# Patient Record
Sex: Female | Born: 1935 | Race: White | Hispanic: No | Smoking: Never smoker
Health system: Southern US, Community
[De-identification: ages and names within clinical notes are randomized; demographics above are authoritative.]

## PROBLEM LIST (undated history)

## (undated) DIAGNOSIS — F99 Mental disorder, not otherwise specified: Secondary | ICD-10-CM

## (undated) DIAGNOSIS — F329 Major depressive disorder, single episode, unspecified: Secondary | ICD-10-CM

## (undated) DIAGNOSIS — I739 Peripheral vascular disease, unspecified: Secondary | ICD-10-CM

## (undated) DIAGNOSIS — I10 Essential (primary) hypertension: Secondary | ICD-10-CM

## (undated) DIAGNOSIS — F419 Anxiety disorder, unspecified: Secondary | ICD-10-CM

## (undated) DIAGNOSIS — I499 Cardiac arrhythmia, unspecified: Secondary | ICD-10-CM

## (undated) DIAGNOSIS — M797 Fibromyalgia: Secondary | ICD-10-CM

## (undated) DIAGNOSIS — S2231XA Fracture of one rib, right side, initial encounter for closed fracture: Secondary | ICD-10-CM

## (undated) DIAGNOSIS — F32A Depression, unspecified: Secondary | ICD-10-CM

## (undated) DIAGNOSIS — I82409 Acute embolism and thrombosis of unspecified deep veins of unspecified lower extremity: Secondary | ICD-10-CM

## (undated) DIAGNOSIS — K219 Gastro-esophageal reflux disease without esophagitis: Secondary | ICD-10-CM

## (undated) DIAGNOSIS — I639 Cerebral infarction, unspecified: Secondary | ICD-10-CM

## (undated) DIAGNOSIS — D649 Anemia, unspecified: Secondary | ICD-10-CM

## (undated) DIAGNOSIS — M1990 Unspecified osteoarthritis, unspecified site: Secondary | ICD-10-CM

## (undated) DIAGNOSIS — Z8719 Personal history of other diseases of the digestive system: Secondary | ICD-10-CM

## (undated) DIAGNOSIS — J189 Pneumonia, unspecified organism: Secondary | ICD-10-CM

## (undated) HISTORY — PX: KNEE SURGERY: SHX244

## (undated) HISTORY — PX: EYE SURGERY: SHX253

## (undated) HISTORY — PX: CHOLECYSTECTOMY: SHX55

## (undated) HISTORY — PX: VARICOSE VEIN SURGERY: SHX832

## (undated) HISTORY — PX: ABDOMINAL HYSTERECTOMY: SHX81

## (undated) HISTORY — PX: ENDOVASCULAR STENT INSERTION: SHX5161

---

## 1999-01-05 ENCOUNTER — Encounter: Admission: RE | Admit: 1999-01-05 | Discharge: 1999-04-05 | Payer: Self-pay | Admitting: Orthopedic Surgery

## 1999-10-25 ENCOUNTER — Ambulatory Visit (HOSPITAL_COMMUNITY): Admission: RE | Admit: 1999-10-25 | Discharge: 1999-10-25 | Payer: Self-pay | Admitting: *Deleted

## 1999-10-25 ENCOUNTER — Encounter (INDEPENDENT_AMBULATORY_CARE_PROVIDER_SITE_OTHER): Payer: Self-pay | Admitting: Specialist

## 2000-01-21 ENCOUNTER — Emergency Department (HOSPITAL_COMMUNITY): Admission: EM | Admit: 2000-01-21 | Discharge: 2000-01-21 | Payer: Self-pay | Admitting: Emergency Medicine

## 2000-01-21 ENCOUNTER — Encounter: Payer: Self-pay | Admitting: Emergency Medicine

## 2001-03-17 ENCOUNTER — Ambulatory Visit (HOSPITAL_COMMUNITY): Admission: RE | Admit: 2001-03-17 | Discharge: 2001-03-17 | Payer: Self-pay | Admitting: Internal Medicine

## 2001-06-21 ENCOUNTER — Observation Stay (HOSPITAL_COMMUNITY): Admission: EM | Admit: 2001-06-21 | Discharge: 2001-06-24 | Payer: Self-pay | Admitting: *Deleted

## 2001-12-04 ENCOUNTER — Encounter: Payer: Self-pay | Admitting: General Surgery

## 2001-12-09 ENCOUNTER — Observation Stay (HOSPITAL_COMMUNITY): Admission: RE | Admit: 2001-12-09 | Discharge: 2001-12-10 | Payer: Self-pay | Admitting: General Surgery

## 2002-05-10 ENCOUNTER — Encounter: Payer: Self-pay | Admitting: Emergency Medicine

## 2002-05-10 ENCOUNTER — Emergency Department (HOSPITAL_COMMUNITY): Admission: EM | Admit: 2002-05-10 | Discharge: 2002-05-10 | Payer: Self-pay | Admitting: Emergency Medicine

## 2002-11-20 ENCOUNTER — Ambulatory Visit (HOSPITAL_COMMUNITY): Admission: RE | Admit: 2002-11-20 | Discharge: 2002-11-20 | Payer: Self-pay | Admitting: *Deleted

## 2003-06-10 ENCOUNTER — Ambulatory Visit (HOSPITAL_COMMUNITY): Admission: RE | Admit: 2003-06-10 | Discharge: 2003-06-10 | Payer: Self-pay | Admitting: Cardiology

## 2003-07-25 ENCOUNTER — Emergency Department (HOSPITAL_COMMUNITY): Admission: EM | Admit: 2003-07-25 | Discharge: 2003-07-25 | Payer: Self-pay | Admitting: Emergency Medicine

## 2005-03-28 DIAGNOSIS — I639 Cerebral infarction, unspecified: Secondary | ICD-10-CM

## 2005-03-28 HISTORY — DX: Cerebral infarction, unspecified: I63.9

## 2005-04-07 ENCOUNTER — Emergency Department (HOSPITAL_COMMUNITY): Admission: EM | Admit: 2005-04-07 | Discharge: 2005-04-07 | Payer: Self-pay | Admitting: Emergency Medicine

## 2005-04-20 ENCOUNTER — Emergency Department (HOSPITAL_COMMUNITY): Admission: EM | Admit: 2005-04-20 | Discharge: 2005-04-20 | Payer: Self-pay | Admitting: Emergency Medicine

## 2005-04-23 ENCOUNTER — Inpatient Hospital Stay (HOSPITAL_COMMUNITY): Admission: EM | Admit: 2005-04-23 | Discharge: 2005-04-26 | Payer: Self-pay | Admitting: Emergency Medicine

## 2005-04-24 ENCOUNTER — Encounter (INDEPENDENT_AMBULATORY_CARE_PROVIDER_SITE_OTHER): Payer: Self-pay | Admitting: Cardiology

## 2005-05-04 ENCOUNTER — Encounter: Admission: RE | Admit: 2005-05-04 | Discharge: 2005-08-02 | Payer: Self-pay | Admitting: Neurology

## 2005-06-26 ENCOUNTER — Encounter: Payer: Self-pay | Admitting: Neurology

## 2005-08-27 ENCOUNTER — Encounter: Payer: Self-pay | Admitting: Interventional Radiology

## 2005-09-04 ENCOUNTER — Ambulatory Visit (HOSPITAL_COMMUNITY): Admission: RE | Admit: 2005-09-04 | Discharge: 2005-09-05 | Payer: Self-pay | Admitting: Interventional Radiology

## 2005-11-30 ENCOUNTER — Ambulatory Visit (HOSPITAL_COMMUNITY): Admission: RE | Admit: 2005-11-30 | Discharge: 2005-11-30 | Payer: Self-pay | Admitting: Interventional Radiology

## 2006-09-20 ENCOUNTER — Ambulatory Visit (HOSPITAL_COMMUNITY): Admission: RE | Admit: 2006-09-20 | Discharge: 2006-09-20 | Payer: Self-pay | Admitting: Interventional Radiology

## 2006-09-30 ENCOUNTER — Ambulatory Visit (HOSPITAL_COMMUNITY): Admission: RE | Admit: 2006-09-30 | Discharge: 2006-10-01 | Payer: Self-pay | Admitting: Interventional Radiology

## 2006-10-15 ENCOUNTER — Ambulatory Visit (HOSPITAL_COMMUNITY): Admission: RE | Admit: 2006-10-15 | Discharge: 2006-10-15 | Payer: Self-pay | Admitting: Interventional Radiology

## 2007-01-17 ENCOUNTER — Ambulatory Visit (HOSPITAL_COMMUNITY): Admission: RE | Admit: 2007-01-17 | Discharge: 2007-01-17 | Payer: Self-pay | Admitting: Interventional Radiology

## 2007-02-10 ENCOUNTER — Inpatient Hospital Stay (HOSPITAL_COMMUNITY): Admission: RE | Admit: 2007-02-10 | Discharge: 2007-02-11 | Payer: Self-pay | Admitting: Interventional Radiology

## 2007-03-03 ENCOUNTER — Encounter: Payer: Self-pay | Admitting: Interventional Radiology

## 2007-06-20 ENCOUNTER — Ambulatory Visit (HOSPITAL_COMMUNITY): Admission: RE | Admit: 2007-06-20 | Discharge: 2007-06-20 | Payer: Self-pay | Admitting: Interventional Radiology

## 2007-12-08 ENCOUNTER — Ambulatory Visit (HOSPITAL_COMMUNITY): Admission: RE | Admit: 2007-12-08 | Discharge: 2007-12-08 | Payer: Self-pay | Admitting: Interventional Radiology

## 2008-01-24 ENCOUNTER — Emergency Department (HOSPITAL_BASED_OUTPATIENT_CLINIC_OR_DEPARTMENT_OTHER): Admission: EM | Admit: 2008-01-24 | Discharge: 2008-01-24 | Payer: Self-pay | Admitting: Emergency Medicine

## 2008-06-15 ENCOUNTER — Ambulatory Visit (HOSPITAL_COMMUNITY): Admission: RE | Admit: 2008-06-15 | Discharge: 2008-06-15 | Payer: Self-pay | Admitting: Interventional Radiology

## 2009-05-16 ENCOUNTER — Encounter: Admission: RE | Admit: 2009-05-16 | Discharge: 2009-05-16 | Payer: Self-pay | Admitting: Gastroenterology

## 2009-10-22 ENCOUNTER — Ambulatory Visit: Payer: Self-pay | Admitting: Diagnostic Radiology

## 2009-10-22 ENCOUNTER — Emergency Department (HOSPITAL_BASED_OUTPATIENT_CLINIC_OR_DEPARTMENT_OTHER): Admission: EM | Admit: 2009-10-22 | Discharge: 2009-10-22 | Payer: Self-pay | Admitting: Emergency Medicine

## 2009-12-29 ENCOUNTER — Ambulatory Visit (HOSPITAL_COMMUNITY): Admission: RE | Admit: 2009-12-29 | Discharge: 2009-12-29 | Payer: Self-pay | Admitting: Interventional Radiology

## 2010-06-17 ENCOUNTER — Encounter: Payer: Self-pay | Admitting: Cardiology

## 2010-08-14 LAB — CBC
HCT: 31.2 % — ABNORMAL LOW (ref 36.0–46.0)
Platelets: 419 10*3/uL — ABNORMAL HIGH (ref 150–400)
RDW: 14 % (ref 11.5–15.5)
WBC: 8.3 10*3/uL (ref 4.0–10.5)

## 2010-08-14 LAB — BASIC METABOLIC PANEL
BUN: 14 mg/dL (ref 6–23)
Calcium: 9.6 mg/dL (ref 8.4–10.5)
GFR calc non Af Amer: >60 mL/min (ref >60)
Glucose, Bld: 91 mg/dL (ref 70–99)
Potassium: 3.8 mEq/L (ref 3.5–5.1)

## 2010-08-14 LAB — URINALYSIS, ROUTINE W REFLEX MICROSCOPIC
Bilirubin Urine: NEGATIVE
Glucose, UA: NEGATIVE mg/dL
Hgb urine dipstick: NEGATIVE
Ketones, ur: NEGATIVE mg/dL
Nitrite: NEGATIVE
Specific Gravity, Urine: 1.02 (ref 1.005–1.030)
pH: 6.5 (ref 5.0–8.0)

## 2010-08-14 LAB — DIFFERENTIAL
Basophils Absolute: 0 10*3/uL (ref 0.0–0.1)
Lymphocytes Relative: 20 % (ref 12–46)
Lymphs Abs: 1.6 10*3/uL (ref 0.7–4.0)
Neutro Abs: 5.9 10*3/uL (ref 1.7–7.7)
Neutrophils Relative %: 70 % (ref 43–77)

## 2010-08-14 LAB — URINE CULTURE: Colony Count: 9000

## 2010-08-14 LAB — URINE MICROSCOPIC-ADD ON

## 2010-08-14 LAB — CULTURE, BLOOD (ROUTINE X 2): Culture: NO GROWTH

## 2010-09-11 LAB — CREATININE, SERUM
Creatinine, Ser: 0.85 mg/dL (ref 0.4–1.2)
GFR calc Af Amer: >60 mL/min (ref >60)

## 2010-09-11 LAB — BUN: BUN: 14 mg/dL (ref 6–23)

## 2010-10-10 NOTE — H&P (Signed)
Natalie Kent, Natalie Kent               ACCOUNT NO.:  [id number]   MEDICAL RECORD NO.:  [id number]           PATIENT TYPE:   LOCATION:                                 FACILITY:   PHYSICIAN:  Sanjeev K. Deveshwar, M.D.DATE OF BIRTH:  1931/03/22   DATE OF ADMISSION:  02/10/2007  DATE OF DISCHARGE:                              HISTORY & PHYSICAL   CHIEF COMPLAINT:  Cerebrovascular disease.   HISTORY OF PRESENT ILLNESS:  This is a pleasant 77 year old female who  was initially referred to Dr. Corliss Skains through the courtesy of Dr.  Pearlean Brownie.  The patient has a history of multiple CVAs and underwent PTA  stenting of the left middle cerebral artery, performed by Dr. Corliss Skains  on September 04, 2005.  She recently had a follow up angiogram performed on  January 17, 2007 that showed a restenosis with an extended portion of the  left middle cerebral artery, estimated to be 80%.  Arrangements were  made to have the patient returned to [facility] today for a  repeat angiogram and possible intervention for the in-stent stenosis.   PAST MEDICAL HISTORY:  Is significant for multiple left CVAs.  She has a  history of hypertension, gastroesophageal reflux disease, palpitations,  depression, asthma.  She has had a left lower extremity DVT.  She had a  negative dobutamine Cardiolite in March of 2007.  The patient had a  wingspan stent placed in the left middle cerebral artery on September 04, 2005 with placement of a second stent within the first stent on Sep 30, 2006.  She now has a further restenosis.   SURGICAL HISTORY:  Is significant for a cholecystectomy, hysterectomy,  hernia repair, bilateral knee surgery.  The patient denies any previous  problems with anesthesia.   ALLERGIES:  Include KEFLEX, SEPTRA and SOLARCAINE.  She denies any  allergies to contrast dye, shrimp, iodine, shellfish or latex.   MEDICATIONS:  Include Effexor XR 150 mg daily, Prevacid 30 mg daily,  Benicar/HCT 40/25 one-half  tablet daily, Plavix 75 mg daily, Singulair  10 mg daily, hydrocodone p.r.n., Advair 250 mcg b.i.d., albuterol  p.r.n., Taztia 240 mg daily, aspirin 81 mg daily, vitamin D daily.   SOCIAL HISTORY:  The patient is widowed, she has 5 children.  She lives  in [place] with her mother.  She has never used alcohol or tobacco.  She is a retired Administrator, sports.   FAMILY HISTORY:  Her mother is alive and well at age 56.  Her father  died at age 16 from cancer.  She has a sister who died from breast  cancer.   LABORATORY DATA:  INR is 1.0, her PT was 13.1, PTT was 65.  Hemoglobin  10.7, hematocrit 32.5, WBC 7.8, platelets 354,000, BUN 14, creatinine  0.77.  Potassium was 3.5, glucose 121, her GFR was greater than 60.  The  patient has a history of anemia with previous hemoglobin in May of 2008  being 9.9, hematocrit 30.1.   REVIEW OF SYSTEMS:  The patient has had some occasional shortness of  breath.  She does  have a history of asthma.  She has had a recent cough  which has been nonproductive.  She has occasional dizziness, she has  constipation which is chronic.  She has arthritis and fibromyalgia.  She  reports bruising easily.  The remainder of the review of systems is  negative.   PHYSICAL EXAM:  Reveals a pleasant 77 year old white female, somewhat  anxious but in no acute distress.  VITAL SIGNS:  Blood pressure 126/72, pulse 74, respirations 18,  temperature 97.4.  HEENT:  Unremarkable.  NECK:  Revealed no bruits.  HEART:  Revealed regular rate and rhythm with a grade 2-3/6 systolic  murmur.  LUNGS:  Were clear but decreased.  ABDOMEN:  Soft, nontender.  EXTREMITIES:  Revealed pulses to be weak but intact.  There was no  edema.  Her airway was rated at a 1, her ASA scale was a 3.  NEUROLOGICAL EXAM:  Mental status, the patient was alert and oriented,  follows commands, cranial nerves II-XII were grossly intact.  Sensation  was intact to light touch.  Motor strength was 5/5  throughout.  Cerebellar testing was intact.   IMPRESSION:  1. Cerebrovascular disease with previous left cerebrovascular      accidents.  2. History of a left middle cerebral artery stenosis with placement of      a wingspan stent in April of 2007, followed by a second stent      placed in May of 2008, now with a recurrent stenosis of      approximately 80%.  3. History of hypertension.  4. Gastroesophageal reflux disease.  5. History of palpitations.  6. History of depression.  7. History of asthma.  8. Remote left lower extremity deep venous thrombosis.  9. History of a negative dobutamine Cardiolite in March 2007.  10.Status post multiple surgeries.  11.Anemia.  12.History of a chronically elevated PTT of uncertain etiology.   PLAN:  As noted the patient was admitted to [facility] today to  undergo a cerebral angiogram and possible treatment of a recurrent  stenosis of a previously placed left middle cerebral artery stent.  The  patient is on chronic aspirin and Plavix therapy.  She also received  nimodipine and vancomycin prior to her intervention.  Further  recommendations will be made by Dr. Corliss Skains after performing the  cerebral angiogram this morning.      Delton See, P.A.    ______________________________  Grandville Silos. Corliss Skains, M.D.    DR/MEDQ  D:  02/10/2007  T:  02/10/2007  Job:  [id number]   cc:   Pramod P. Pearlean Brownie, MD  Soyla Murphy Renne Crigler, M.D.  Sanjeev K. Corliss Skains, M.D.

## 2010-10-10 NOTE — Discharge Summary (Signed)
Natalie Kent, Natalie Kent               ACCOUNT NO.:  [id number]   MEDICAL RECORD NO.:  [id number]          PATIENT TYPE:  INP   LOCATION:  3113                         FACILITY:  [facility]   PHYSICIAN:  Sanjeev K. Deveshwar, M.D.DATE OF BIRTH:  [birth year]   DATE OF ADMISSION:  02/10/2007  DATE OF DISCHARGE:  02/11/2007                               DISCHARGE SUMMARY   CHIEF COMPLAINT:  Cerebrovascular disease.   HISTORY OF PRESENT ILLNESS:  This is a pleasant 77 year old female who  was referred to Dr. Corliss Skains through the courtesy of Dr. Pearlean Brownie.  The  patient has a history of multiple CVAs and underwent PTA stenting of the  left middle cerebral artery by  Dr. Corliss Skains on September 04, 2005.  She had a second stent placed in the  left middle cerebral artery in May 2008.  The patient had a recent  cerebral angiogram that revealed an 80% in-stent stenosis.  Arrangements  were made to readmit the patient for further evaluation and treatment on  February 10, 2007.   PAST MEDICAL HISTORY:  Significant for multiple left CVA's.  She has a  history of hypertension, gastroesophageal reflux disease, palpitations,  depression, and asthma.  She has a history of a remote left lower  extremity DVT.  She had a negative dobutamine Cardiolite in July of this  year performed at [facility], although her  cardiologist of record is Dr. Aleen Campi.  The Cardiolite apparently was  negative with an ejection fraction of 79%.   SURGICAL HISTORY:  The patient is status post cholecystectomy,  hysterectomy, hernia repair, and bilateral knee surgery.  The patient  denies any previous problems with anesthesia.   ALLERGIES:  She is allergic to [facility], SEPTRA, and SOLARCAINE.  She  denies allergies to contrast dye, shrimp, iodine, shellfish or latex.   MEDICATIONS AT TIME OF ADMISSION:  1. Effexor XR 150 mg daily.  2. Prevacid 30 mg daily.  3. Benicar/HCT 40/25 1/2 tablet daily.  4. Plavix  75 mg daily.  5. Singulair 10 mg daily.  6. Hydrocodone p.r.n.  7. Advair 250 mcg b.i.d.  8. Albuterol p.r.n.  9. Taztia 240 mg daily.  10.Aspirin 81 mg daily.  11.Vitamin D daily.   SOCIAL HISTORY:  The patient is widowed.  She has five children.  She  lives in [place] with her mother.  She has never used alcohol or  tobacco.  She is a retired day Occupational hygienist.   FAMILY HISTORY:  Her mother is alive and well at age 105.  Her father  died at age 28 from cancer.  She has a sister who died from breast  cancer.   HOSPITAL COURSE:  As noted this patient was admitted to [facility] on February 10, 2007 for further evaluation of a previously  stented left middle cerebral artery stenosis.  A recent angiogram had  revealed an 80% in-stent stenosis.  The angiogram was performed on the  day of admission by Dr. Corliss Skains under conscious sedation.  Again she  was found to have an 80-90% stenosis within the stent in  the left middle  cerebral artery.  A PTA of the stent was performed by Dr. Corliss Skains  under general anesthesia with excellent results.  There were no known or  immediate complications.   The patient did develop some respiratory distress when coming out of  anesthesia.  She does have a history of asthma.  She developed some  wheezing and received a nebulizer treatment.  She was later admitted to  the neurointensive care unit.   While being transported to the neurointensive care unit the patient  developed hypotension and bradycardia.  Her systolic blood pressure was  in the 80s to 90s.  Her heart rate was in the 30-40 range.  The patient  was given a bolus of normal saline.  It was felt that this was possibly  a vasovagal reaction.  Gradually her blood pressure improved.  She also  had some epigastric burning associated with this episode.  She was given  Maalox which did relieve the burning.  Her heart rate remained slow in  the 40-50 beats per minute range.   A  cardiology consult was obtained for further evaluation.  She was seen  by Dr. Nanetta Batty.  An EKG showed sinus bradycardia with a question  of a prolonged QT interval but no ischemic changes.  As noted she had  recently had a negative dobutamine Cardiolite.  Cardiac enzymes were  performed.  These were negative for an MI.   The patient was much improved the following day, although she was noted  to be anemic with hemoglobin 8.6.  Hematocrit was 25.7.  On the day of  admission hemoglobin had been 9.1, hematocrit 27.5.  A decision was made  to transfuse the patient 1 unit of packed red blood cells.  A repeat CBC  is currently pending.   The patient is now on bedrest following removal of the right femoral  groin sheath.  She is stable at this time from a neurological and  cardiology standpoint.  If she remained stable after her bedrest she  will be discharged early this evening in improved and stable condition.   LABORATORY DATA:  A PTT on the day of discharge was elevated at 86.  The  patient has a chronically elevated PTT of uncertain etiology. As noted  cardiac enzymes were negative x2.  A basic metabolic panel revealed BUN  8, creatinine 0.71.  GFR was greater than 60.  Potassium 3.6, glucose  100.  CBC on the morning of discharge revealed hemoglobin 8.6,  hematocrit 25.7, WBC 7,500, platelets 305,000.  CBC on the 15th revealed  hemoglobin 9.1, hematocrit 27.5, WBC 9,700,  platelets 296,000.  As  noted the patient will be transfused 1 unit of packed red blood cells.   DISCHARGE INSTRUCTIONS:  The patient will remain on the medications she  was on prior to admission which did include aspirin and Plavix.  Please  see complete list as dictated above.   The patient was given instructions regarding wound care.  She was told  not to do anything strenuous for at least two weeks.  The patient will  follow up with Dr. Corliss Skains in approximately two weeks.  She will need  to have a  follow-up CBC checked at some point as well to further  evaluate her anemia.  A follow-up angiogram will be performed in  approximately three months to further evaluate the status of her left  middle cerebral artery stent.   PROBLEMS AT DISCHARGE:  1. Cerebrovascular disease with  previous cerebrovascular accidents.  2. History of a left middle cerebral artery stenosis with a wingspan      stent placed in April 2007 followed by a second stent in May 2008      and a recent restenosis.  3. PTA of the left middle cerebral artery stent performed February 10, 2007 with excellent results.  4. History of hypertension.  5. Gastroesophageal reflux disease.  6. History of palpitations.  7. History of depression.  8. History of asthma.  9. Remote left lower extremity deep venous thrombosis.  10.History of a negative dobutamine Cardiolite in July of this year.  11.Status post multiple surgeries as noted above.  12.Anemia requiring transfusion.  13.Chronically elevated PTT of uncertain etiology.  14.Probable vasovagal episode this admission associated with      hypotension and bradycardia.  15.Transient hypokalemia.      Delton See, P.A.    ______________________________  Grandville Silos. Corliss Skains, M.D.    DR/MEDQ  D:  02/11/2007  T:  02/11/2007  Job:  [id number]   cc:   Pramod P. Pearlean Brownie, MD  Soyla Murphy Renne Crigler, M.D.  Antionette Char, MD

## 2010-10-10 NOTE — Consult Note (Signed)
NAMEJAIYLA, GRANADOS               ACCOUNT NO.:  [id number]   MEDICAL RECORD NO.:  [id number]          PATIENT TYPE:  OUT   LOCATION:  XRAY                         FACILITY:  [facility]   PHYSICIAN:  Sanjeev K. Deveshwar, M.D.DATE OF BIRTH:  Feb 13, 1931   DATE OF CONSULTATION:  DATE OF DISCHARGE:                                 CONSULTATION   CHIEF COMPLAINT:  Cerebrovascular disease status post left middle  cerebral artery PTA stenting on Sep 30, 2006, for intrastent stenosis.   HISTORY OF PRESENT ILLNESS:  Ms. Knisley is a pleasant 77 year old  female referred to Dr. Corliss Skains through the courtesy of Dr. Pearlean Brownie.  The  patient has a history of multiple left CVAs felt secondary to a left  middle cerebral artery stenosis.  She has had right-sided deficits,  although these significantly improved following her strokes.   On September 04, 2005, the patient underwent PTA stenting of the left middle  cerebral artery performed by Dr. Corliss Skains under general anesthesia  using the Wingspan stenting system.  The patient has been followed  closely since that time.  She recently developed new symptoms in her  right upper extremity including numbness and weakness.  A repeat  angiogram was performed and she was found to have in-stent stenosis  which was quite severe.  On Sep 30, 2006, patient underwent placement of  a second stent within the previously placed first wing span stent in her  left middle cerebral artery.  There were no immediate or known  complications.  The patient returns today to be seen in follow-up.   PAST MEDICAL HISTORY:  As noted, the patient has had multiple left CVAs.  She has history of hypertension, gastroesophageal reflux disease,  palpitations, depression, asthma, a previous left lower extremity DVT  and a history of a negative dobutamine Cardiolite performed August 24, 2005.  She underwent placement of a Wingspan stent on September 04, 2005,  with placement of a second stent within  the first stent on Sep 30, 2006.   SURGICAL HISTORY:  1. Cholecystectomy.  2. Hysterectomy.  3. Hernia repair.  4. Bilateral knee surgery.  5. Denies any previous problems with anesthesia.   ALLERGIES:  KEFLEX, SEPTRA AND SOLARCAINE.   MEDICATIONS:  1. Effexor.  2. Singulair.  3. Aspirin.  4. Prevacid.  5. Benicar.  6. Vitamin D.  7. Plavix.  8. Advair.  9. Albuterol.  10.Diltiazem.   SOCIAL HISTORY:  The patient is widowed.  She has five children.  She  lives in [place] with her mother.  She has never used tobacco or  alcohol.  She is a retired Engineering geologist.   FAMILY HISTORY:  Her mother is alive and well at age 66.  Her father  died at age 60 from cancer.  She has a sister who died from breast  cancer.   IMPRESSION/PLAN:  As noted, the patient returns today to be seen in  follow-up after undergoing placement of a second stent in the left  middle cerebral artery for in-stent stenosis of the previously placed  stent.  The patient reports that she  has been doing well overall, though  she feels somewhat fatigued.  She feels she is having trouble getting  her strength back following the procedure.  She was noted to be anemic  during her hospital stay.  She was asked to follow-up with her family  physician, Dr. Merri Brunette, to have a CBC blood test as well as a  potassium level checked.  However, apparently, these labs could not be  performed at the office.  We will check a CBC and serum potassium level  today.   The patient has some residual numbness in the two fingers of her right  hand which has been a longstanding problem.  She has had occasional  sharp shooting pains in the left side of her head, but otherwise, has  been stable from a neurological standpoint.  She remains on aspirin and  Plavix which she has been taking long-term.  There are no plans to stop  the Plavix at any point in the near future.   Dr. Corliss Skains recommended a follow-up angiogram in  approximately 3  months to evaluate the patency of the stents.  She was told to call  sooner if she had any new neurological deficits.  We will call her with  the results of her blood work later this week.  Greater than 15 minutes  was spent on this consult.      Delton See, P.A.    ______________________________  Grandville Silos. Corliss Skains, M.D.    DR/MEDQ  D:  10/15/2006  T:  10/15/2006  Job:  [id number]   cc:   Soyla Murphy. Renne Crigler, M.D.  Pramod P. Pearlean Brownie, MD

## 2010-10-13 NOTE — H&P (Signed)
[facility]  Patient:    Natalie Kent, Natalie Kent Visit Number: [id number] MRN: [id number]          Service Type: MED Location: [phone] Attending Physician:  Natalie Kent Dictated by:   Natalie Kent. Natalie Kent, M.D. Admit Date:  06/20/2001   CC:         Natalie Kent, M.D.   History and Physical  DATE OF BIRTH:  2030/10/16  HISTORY OF PRESENT ILLNESS:  Natalie Kent is a 77 year old resident of [place], [place], admitted with a chief complaint of abdominal pain.  She states that she felt absolutely fine until 10 a.m. on June 20, 2001, when she had sudden abdominal discomfort.  It started in the lower abdomen and went upwards and involved the whole abdomen.  She had cramps and cool sweaty skin, but no nausea or vomiting.  At the time it occurred, she felt awful "like I was dying."  She had diarrhea, but did not look at the diarrhea itself.  She did notice that there was blood on the toilet paper.  Then she took an Imodium tablet and had no further bowel movements.  The pain was intermittent and less than afternoon, but worsened this evening again and she came to the emergency room for evaluation.  She has had no nausea or vomiting and no fevers, but some chills.  She has had recent iron deficiency anemia and is due to see Natalie Kent, M.D., soon for a follow-up visit.  PAST MEDICAL HISTORY:  Notable for asthma and hypertension.  ALLERGIES:  She is allergic to SULFA (rash), KEFLEX (rash), and the CAINES.  CURRENT MEDICATIONS:  1. Prevacid 30 mg p.o. q.a.m.  2. Effexor XR 75 mg p.o. b.i.d.  3. Hyzaar 100 mg q.a.m.  4. Singulair 10 mg q.a.m.  5. Advair 250/50 one inhalation twice a day.  6. Albuterol one to two puffs q.i.d. p.r.n.  7. Iron sulfate one p.o. each morning.  8. Calcium 1500 mg daily.  9. Darvocet one q.i.d. p.r.n. pain. 10. Multivitamins one daily.  PERSONAL HISTORY:  [place]  She is  retired and stays with her mother.  She does not get any regular exercise.  She does not use any ethanol or tobacco.  FAMILY HISTORY:  Her father had colon cancer.  Her mother is alive and well. A brother had prostate cancer.  A sister has had breast cancer.  Five children alive and well.  REVIEW OF SYSTEMS:  She has had no numbness, tingling, weakness, seizures, syncope, or dizziness.  She had a slight headache earlier today.  The skin is dry with no rashes.  There is no sinus pain, ear pain, runny nose, sore throat, or difficulty swallowing.  Occasional dry cough, infrequent, but no chest pain, shortness of breath, or change in her exercise tolerance.  No palpitations, vaginal bleeding, or discharge.  Although she has had no dysuria or hematuria, she has been going to the bathroom no more frequently than usual.  Chronic low back pain and leg pain is really unchanged.  No swelling. Depression is under good control with medications.  PHYSICAL EXAMINATION:  In general, she is slightly overweight and in no acute distress.  Color is good.  VITAL SIGNS:  Temperature 98.6 degrees, BP initially 183/85 with pulse 109, respirations 18, O2 saturation 93% on room air.  SKIN:  Moisture and slight erythema under her left breast fold.  LYMPHATICS:  There is no cervical, supraclavicular, axillary,  or inguinal adenopathy.  HEENT:  Normocephalic and atraumatic.  TMs are normal.  Conjunctivae are normal.  The fundi appear normal.  Her tongue and posterior pharynx are normal.  She does appear dry at this time.  NECK:  Supple.  There are no neck masses and no thyromegaly.  No carotid bruits.  PERIPHERAL PULSES:  Carotids, radials, and posterior tibials 2+ and equal. Dorsalis pedis pulses 1+.  BREASTS:  Pendulous with no masses.  LUNGS:  Clear to auscultation.  HEART:  Regular rate and rhythm.  No murmurs, rubs, or gallops.  No JVD.  No edema.  ABDOMEN:  Nontender at this time.  Bowel  sounds present.  No organomegaly.  o masses.  Bimanual exam shows no masses or tenderness.  RECTAL:  Exam previously done by the emergency room physician and is not repeated at this time per her request.  It showed frankly bloody stool.  IMPRESSION:  1. Gastrointestinal bleed, question diverticulitis.  Her white count is only     slightly elevated with no left shift at this time.  Some types of     infectious diarrheas are actually worsened by antibiotic use.  I am     holding off on antibiotics at this time and closely observing her in the     hospital on IV fluids and a fortified clear liquid diet.  I do not think     that her GI bleeding is heavy enough at this time to warrant transfusions.     In fact, her hemoglobin is quite good.  2. Mild impaired glucose tolerance.  Will check CBGs for two days.  3. Urinary frequency with mild pyuria.  Will check a urine culture.  I doubt     that she has an infection there at this time.  4. Multiple allergies, see above.  5. Depression.  6. Recent iron deficiency anemia.  The patient admits to work-up by Natalie Kent, M.D.  7. Asthma.  8. History of gastritis.  9. Family history of colon and breast cancer. 10. History of diverticulosis. Dictated by:   Natalie Kent. Natalie Kent, M.D. Attending Physician:  Natalie Kent DD:  06/21/01 TD:  06/21/01 Job: [id number]

## 2010-10-13 NOTE — Op Note (Signed)
   Natalie Kent, Natalie Kent                         ACCOUNT NO.:  [id number]   MEDICAL RECORD NO.:  [id number]                   PATIENT TYPE:  AMB   LOCATION:  ENDO                                 FACILITY:  [facility]   PHYSICIAN:  Georgiana Spinner, M.D.                 DATE OF BIRTH:  06-08-1930   DATE OF PROCEDURE:  11/20/2002  DATE OF DISCHARGE:                                 OPERATIVE REPORT   PROCEDURE:  Colonoscopy.   INDICATIONS:  Colon polyps, colon cancer screening.   ANESTHESIA:  1. Demerol 130 mg.  2. Versed 14 mg.   DESCRIPTION OF PROCEDURE:  With patient mildly sedated in the left lateral  decubitus position, the Olympus videoscopic colonoscope PCF-100 was inserted  in the rectum and passed under direct vision through a very tortuous,  diverticula-filled, sigmoid colon to the cecum, identified by the ileocecal  valve and appendiceal orifice, both of which were photographed.  From this  point, the colonoscope was slowly withdrawn, taking circumferential views of  the entire colonic mucosa, stopping only to photograph diverticula along the  way in the sigmoid colon until we reached the rectum which appeared normal  on direct and showed hemorrhoids on retroflexed view.  The endoscope was  straightened and withdrawn.  The patient's vital signs and pulse oximeter  remained stable.  The patient tolerated the procedure well without apparent  complications.   FINDINGS:  Internal hemorrhoids and significant diverticulosis of the  sigmoid colon.   PLAN:  Repeat examination in possibly five years.                                               Georgiana Spinner, M.D.    GMO/MEDQ  D:  11/20/2002  T:  11/20/2002  Job:  [id number]

## 2010-10-13 NOTE — Consult Note (Signed)
Natalie Kent, Natalie Kent               ACCOUNT NO.:  [id number]   MEDICAL RECORD NO.:  [id number]          PATIENT TYPE:  OUT   LOCATION:  XRAY                         FACILITY:  [facility]   PHYSICIAN:  Delton See, P.A.   DATE OF BIRTH:  Feb 10, 1931   DATE OF CONSULTATION:  DATE OF DISCHARGE:  10/15/2006                                 CONSULTATION   BRIEF ADDENDUM:  Date of the addendum 10/17/2006.  Natalie Kent was seen  back in the office on 10/15/2006 following her PTA stenting of the left  middle cerebral artery performed Sep 30, 2006.  During her hospital stay,  she had been anemic and had a low potassium level.  We repeated her labs  on May 20.  The potassium came back at 3.8.  The hemoglobin came back  10.9, hematocrit 33.  On May 6, her hemoglobin had been 9.9, hematocrit  30.1.  Potassium had been 3.7.   We called Natalie Kent with her lab results.  We let her know that the  hemoglobin was improving.  We recommended that she continue with a  healthy diet and also recommended a multivitamin with iron or possible  additional iron supplement that could be obtained over-the-counter at  her local pharmacy.  We told her to call with any problems.  We will see  her back as scheduled.      Delton See, P.A.     DR/MEDQ  D:  10/17/2006  T:  10/17/2006  Job:  [id number]   cc:   Soyla Murphy. Renne Crigler, M.D.  Pramod P. Pearlean Brownie, MD

## 2010-10-13 NOTE — Procedures (Signed)
[facility]  Patient:    Natalie Kent, Natalie Kent                      MRN: [id number] Adm. Date:  [id number] Attending:  Sabino Gasser                           Procedure Report  PROCEDURES:  Upper endoscopy with biopsies.  INDICATIONS:  Abdominal discomfort and iron deficiency anemia.  ANESTHESIA:  Demerol 80 mg and Versed 8 mg was given intravenously in divided dose.  DESCRIPTION OF PROCEDURE:  With the patient mildly sedated in the left lateral decubitus position, the Olympus videoscopic endoscope was inserted through the mouth and passed under direct vision through the esophagus, which appeared to show possible Barretts esophagus.  If so, it would be a short segment.  This was photographed and subsequently biopsied, but then we entered into the stomach.  The antrum was approached and appeared normal as did the duodenal bulb and second portion of the duodenum.  Biopsies were taken to rule out sprue.  After passing it to the small bowel, the endoscope was then slowly withdrawn, taking circumferential views of the entire duodenal mucosa.  The endoscope was then pulled back into the stomach and placed in retroflexion to view the stomach from below.  This showed a hiatal hernia and was photographed.  The endoscope was straightened and pulled back from the distal to proximal stomach, taking circumferential views of the entire gastric mucosa, stopping in the fundus where snake skinning of the mucosa was noted, photographed, and biopsied.  The endoscope was then withdrawn, taking circumferential views of the remaining gastric and esophageal mucosa, which otherwise appeared normal.  The patients vital signs and pulse oximetry remained stable.  The patient tolerated the procedure well without apparent complications.  FINDINGS:  A question of Barretts esophagus above a hiatal hernia.  Question of gastropathy.  These were all biopsied to rule out sprue.  PLAN:  Will  have the patient call me for results of biopsies and follow up with me as an outpatient. DD:  10/25/99 TD:  10/26/99 Job: [id number]

## 2010-10-13 NOTE — H&P (Signed)
[name], ADSIT               ACCOUNT NO.:  [id number]   MEDICAL RECORD NO.:  [id number]          PATIENT TYPE:  INP   LOCATION:  1825                         FACILITY:  [facility]   PHYSICIAN:  Natalie Kent, M.D.  DATE OF BIRTH:  July 02, 1930   DATE OF ADMISSION:  04/23/2005  DATE OF DISCHARGE:                                HISTORY & PHYSICAL   HISTORY AND PHYSICAL AND CONSULTATION:  The consult was called by Dr. Devoria Kent, through the [facility] ER system.   CHIEF COMPLAINTS:  I can't speak right.   Mrs. Natalie Kent is a 77 year old Caucasian, right-handed, widowed female  who lives with her mother in [place] and has two adult sisters living  nearby. According to her family, the patient has History of Present Illness  is as follows. The patient started to have changes about 2-1/2 weeks ago  with right-sided arm pain, right-sided numbness, and occasional clumsiness  but did not present to her doctor with that complaint as far as they known,  and they state that she saw her primary care physician last Wednesday prior  to Thanksgiving holiday and that he treated her at that time for a  gastrointestinal virus. About 14 days ago on November 11, she was seen at  [facility] Room, however, for a CT scan of the head, and I cannot  elicit from the patient why she was evaluated with that study. She denies  any headaches, any speech difficulty, slurring of speech, or balance problem  at that time. On Friday of the week following Thanksgiving, she presented  again to [facility] and was sent home after complaining there about pain in  her right arm and that her right face was drooping. Her daughter states that  the right face was significantly droopy and that it had led to some slurring  of speech. The patient was discharged under the assumption that she was  dehydrated from her GI virus illness. She was sent home without any further  workup, but a followup appointment was  made for today with Dr. Carolee Kent  office. A CT was not repeated as it was felt that she had just a 15-day-old  study available. She was able Saturday and Sunday to function fully. She ate  with knife and fork, communicated with her family, had no trouble with  walking, speaking, swallowing, and felt well until last evening. Sunday  evening, her speech again became slurred, and today she had her daughter  talk on the phone to her primary care physician's office who asked her to  come, instead of to the office, to the emergency room.   REVIEW OF SYSTEMS:  Pain, weakness, numbness at various times over the last  14 days. Speech slurring that has gone and come back, and she has had  problem with gastrointestinal viral illness,  diarrhea, and nausea.   PAST MEDICAL HISTORY:  The patient suffers from hypertension and has, in her  own words, an acinetic spot on her heart and has asthma.   SOCIAL HISTORY:  The patient is widowed, lives with her own  mother who is 50  but healthy for her age. She denies any alcohol, nicotine or drug use. She  is a retired Chemical engineer.   FAMILY HISTORY:  The patient's sister died of breast cancer. Her father died  at 38 of, cancer. She did not state of what kind. Her mother is 63 and  considered healthy.   MEDICATIONS:  Not available.   ALLERGIES:  Not available.   PHYSICAL EXAMINATION:  VITAL SIGNS:  Blood pressure 155/88, temperature 98  degrees Fahrenheit, respiratory rate is 16-18 and regular. The patient is  not short of breath in no acute distress. Heart rate is 97.  LUNGS: Clear to auscultation.  COR: Regular rate and rhythm. There is no murmur, no edema, no carotid  bruits. No clubbing, cyanosis, bruising, or rash.  NEUROLOGIC: The patient is alert and oriented x3. She knows her age, knows  the president's name, was able to state that she is at [facility] in the  ER.  Her pupils react equally to light and accommodation. Full extraocular   movements, full facial and visual field to stimuli bilaterally. The  patient's right facial droop is still present, but there is no associated  tongue or uvula deviation.  She states that her right and left face feels  similar. She has no arm or neck range of motion limitation. no papilledema.  Right hand grip weakness is very mild but is definitely present in  comparison to the strong left side. On the a pronator examination, she shows  a mild drift on extension on the right hand, but on finger-nose test shows  no dysmetria. She also showed initially right leg weakness with following  the motor command of extending her leg against gravity. When I asked to  concentrate, she did not to show the same leg weakness on the right any  more. I do think there is a component of extinction.   Deep tendon reflexes were equal. She has bilateral upgoing toes to plantar  stimulation. Her sensory she states that the right arm feels different but  cannot tell why. Sometimes she uses a word heavy, sometimes dull   Gait testing was deferred.   ASSESSMENT:  The patient passed a swallowing test at bedside. MRI was  reviewed as well as the CT from 15 days ago. The history of the patient was  confirmed in detail with her daughters for an hour in search of her medicine  list.  She will be admitted to the stroke MD service for a stroke in the  left hemisphere that appears to be a watershed infarct caused by a left-  sided branch stenosis of the left middle cerebral artery. There is also a  caudate infarct visible on MRI that might be older than the watershed  infarct in the subcortical areas. I explained to the patient and her  daughters and I would like the patient to receive heparin IV without bolus,  that she will have a CT scan of the head tomorrow as well as cardiac  evaluation, and that we might in 2 to 3 weeks or even longer be able to put a stent and was presumed a focal stenosis of MCA branch on the  left. At this  time, a stenting would not be possible and could cause a bleed. The family  understood that there is a risk of bleeding with giving heparin as well.  Plavix and aspirin might be the safer alternative, but I do feel that they  could not prevent an extension of the watershed infarct. The patient's blood  pressure will be controlled around 160 mmHg for systolic pressure. She will  be admitted to 3100 for regular neurologic checks and will receive 2 liters  O2 by nasal cannula. Again, I am still awaiting her medication list.           ______________________________  Natalie Kent, M.D.     CD/MEDQ  D:  04/23/2005  T:  04/23/2005  Job:  [id number]   cc:   Soyla Murphy. Renne Crigler, M.D.  Fax: [phone]   Pramod P. Pearlean Brownie, MD  Fax: [phone]

## 2010-10-13 NOTE — Discharge Summary (Signed)
[place]  Patient:    Natalie Kent, Natalie Kent Visit Number: [id number] MRN: [id number]          Service Type: MED Location: [phone] Attending Physician:  Londell Moh Dictated by:   Soyla Murphy. Renne Crigler, M.D. Admit Date:  06/20/2001 Discharge Date: 06/24/2001                             Discharge Summary  RADIOLOGY REPORTS:  Chest x-ray with evidence of previous abdominal and pelvic surgery and lumbar degenerative changes and scoliosis on abdomen. Chest x-ray with minimal basilar atelectasis.  EKG with normal sinus rhythm, marked sinus arrhythmia.  White count 11.7 initially, 7.4 at time of discharge.  Initial hemoglobin 14.1 with 10.8 as of June 24, 2001.  Platelet counts were normal. INR normal. Lupus anticoagulant panel showed PT/D normal range slightly high, dilute ruffle viper venom on one test and normal range on the next.  PTT/D was 50 which was slightly high but PTT next was normal.  Glucose 120; otherwise, CMET was normal.  Troponin I and CKs were normal.  Urinalysis showed 3 to 6 white cells.  Blood cultures x2 were negative.  Urine culture insignificant growth with ANA negative, beta II glycoprotein antibody IgA of 5, less than 18 is in normal range.  Anticardiolipin antibody was less than 14 of June 22, 2001; separate test was slightly high at 28. ENA was 2 scleroderma antibodies within normal range, right IgG to ribonucleic protein normal range.  HOSPITAL COURSE:  Please see admission history and physical for details of her presentation.  Briefly, Natalie Kent was admitted with lower GI bleed. She was seen in consultation by Wilhemina Bonito. Eda Keys., M.D.  Her abdominal pain gradually resolved.  She was treated empirically with antibiotics for possible diverticulitis.  She was then seen by Sabino Gasser, M.D., who noted that she was improving and should follow up as an outpatient.  IMPRESSION: 1. Lower gastrointestinal  bleeding, question infectious colitis versus    diverticulitis; follow-up as an outpatient. 2. Elevated PTT often clear, this could be contributing somewhat to the    bleeding. 3. Anemia secondary to blood loss. 4. Family history of colon cancer and breast cancer. 5. Mild pyuria with urine culture negative. 6. Multiple medication allergies. See admission H&P for details. 7. Recent iron-deficiency anemia. 8. Gastroesophageal reflux. 9. Anxiety/depression.  DISPOSITION:  She was discharged home on the following medications: 1. Cipro 500 mg p.o. twice a day for seven days. 2. Flagyl 250 mg t.i.d. x7 days. She will be followed this Friday in the    office. 3. Other medications are unchanged from admission.  CONDITION ON DISCHARGE:  Overall improved. Dictated by:   Soyla Murphy. Renne Crigler, M.D.  Attending Physician:  Londell Moh DD:  08/20/01 TD:  08/21/01 Job: [id number]

## 2010-10-13 NOTE — Op Note (Signed)
[facility]  Patient:    Natalie Kent, Natalie Kent Visit Number: [id number] MRN: [id number]          Service Type: SUR Location: [id number] Attending Physician:  Tempie Donning Dictated by:   Gita Kudo, M.D. Proc. Date: 12/09/01 Admit Date:  12/09/2001 Discharge Date: 12/10/2001   CC:         Zollie Beckers D. Renne Crigler, M.D.   Operative Report  PREOPERATIVE DIAGNOSIS:  Incisional hernia.  POSTOPERATIVE DIAGNOSIS:  Incisional hernia  OPERATION:  Repair incisional hernia with Davol Ventralex mesh.  SURGEON:  Gita Kudo, M.D.  ANESTHESIA:  General.  CLINICAL SUMMARY:  This 77 year old obese, asthmatic lady underwent gallbladder surgery in the 1980s and has incisional hernia.  She has been seen in the office before.  Recently, after a severe attack of asthma, the hernia was incarcerated and painful.  It was eventually reduced.  She comes in now for elective repair.  OPERATIVE FINDINGS:  There was an incisional hernia in the medial portion of her subcostal incision on the right.  The fascial defect was approximately 3 cm.  The mesh that was used had a diameter of 6.4 cm.  DESCRIPTION OF PROCEDURE:  Under satisfactory general endotracheal anesthesia, having received 1 g Ancef preop, the patients abdomen was prepped and draped in a standard fashion.  The subcostal incision was opened in its medial third.  Dissection carried into the subcutaneous tissue and the hernia sac identified.  It was freed around to healthy fascia.  Then the cautery was used to dissect the fascial hernia sac junction and the preperitoneal space developed with finger dissection after hernia reduced.  I freed the undersurface with gentle blunt dissection of the abdominal wall approximately 3 cm in all directions around the defect itself.  Then the Ventralex mesh was inserted and allowed to open with the smooth side down and the mesh up.  The finger was again inserted to make  sure that the device was properly seated. Then the mesh tags were cut the appropriate length and secured to the upper and lower fascial margins with through-and-through sutures of 0 Prolene.  The defect they lay without tension and was, therefore, closed primarily with interrupted figure-of-eight 0 Prolene suture.  The subcutaneous tissue was lavaged with saline and closed with interrupted 2-0 Vicryl, and then the skin edges were approximated with staples.  Sterile dressings were then applied, and the patient went to the recovery room from the operating room in good condition.  There were no complications, sponge and needle counts correct. Dictated by:   Gita Kudo, M.D. Attending Physician:  Tempie Donning DD:  12/09/01 TD:  12/11/01 Job: [id number]

## 2010-10-13 NOTE — Discharge Summary (Signed)
NAMESAMEEN, Natalie Kent               ACCOUNT NO.:  [id number]   MEDICAL RECORD NO.:  [id number]          PATIENT TYPE:  OIB   LOCATION:  3111                         FACILITY:  [facility]   PHYSICIAN:  Sanjeev K. Deveshwar, M.D.DATE OF BIRTH:  January 25, 1931   DATE OF ADMISSION:  09/04/2005  DATE OF DISCHARGE:  09/05/2005                                 DISCHARGE SUMMARY   BRIEF HISTORY:  This is a pleasant 77 year old female with a history of a  left middle cerebral artery CVA November of 2006.  She had some mild  residual right-sided deficits which apparently have significantly improved.  She had a MRI/MRA April 23, 2005 that showed multiple previous left-sided  infarcts as well as a severe left middle cerebral artery stenosis.  The  patient was referred to Dr. Corliss Skains.  He saw the patient in consultation  on June 26, 2005 and arrangements were made to have the patient return on  September 04, 2005 for a cerebral angiogram with possible PTA stenting of the  left middle cerebral artery.   PAST MEDICAL HISTORY:  1.  Significant for the above-noted left CVA.  2.  Hypertension.  3.  Gastroesophageal reflux disease.  4.  Occasional palpitations.  5.  History of depression.  6.  History of asthma.  7.  A left lower extremity DVT in the past.  8.  Some sort of cardiac problems; however, she recently had a dobutamine      Cardiolite performed on August 24, 2005 by Dr. Aleen Campi.  It was felt to      be a low risk study with no ischemia noted.   ALLERGIES:  KEFLEX and SEPTRA.  There is also a question of an allergy to  CAINE medications.  She used SOLARCAINE once when she had a bad sunburn and  had a subsequent syncopal episode.  The true nature of her allergy was  unclear.   SOCIAL HISTORY:  The patient is widowed.  She has five children.  She lives  in [place] with her mother who is 21 years old.  She has never used  alcohol or tobacco.  She is a retired day Occupational hygienist.   FAMILY  HISTORY:  Her mother is alive and well at age 21.  Her father died at  age 36 from cancer.  She has a sister who died from breast cancer.   HOSPITAL COURSE:  As noted, this patient was admitted to [facility]  on September 04, 2005 to undergo cerebral angiogram and possible PTCA stenting  with a history of left CVAs.  The angiogram was performed on the day of  admission.  The patient was found to have a significant stenosis of the left  middle cerebral artery.  A PTA was performed along with placement of a  wingspan stent.  The patient tolerated the procedure well.  She was  subsequently admitted to the neurologic intensive care unit where she was  kept on heparin overnight.  The patient did have some hematuria while on  heparin.   The following day, the heparin was discontinued and the sheath  was removed  from her right groin area.  Hemostasis was obtained and the patient was  placed on bed rest for six hours.  The plan is to ambulate the patient  following her bed rest and proceed with discharge if she remains stable.   As noted, the patient did have some hematuria while on IV heparin; however,  this appears to be clearing.  She has some mild hypertension which was  treated with p.r.n. hydralazine.  She also coughed up a small amount of  dark, blood-tinged sputum x1.  It was felt that this may have been related  to her intubation.   As noted, the plan at this time is to discharge the patient later this  evening if she remains stable.   LABORATORY DATA:  A urinalysis revealed a few bacteria, 0 to 2 WBCs per high  powered field and RBCs too numerous to count.  Specific gravity was 1.041.  Protein was 30.  Leukocytes were small.  Blood was large.  Ketones were  trace.  A chemistry profile on the day of discharge revealed BUN 9,  creatinine 0.7, potassium 3.7, glucose is 92.  A CBC on the day of discharge  revealed hemoglobin 11.3, hematocrit 33.8, WBC 9.2 thousand, platelets   273,000 and INR on admission  was 1.0.  Pro time was 13.2.  A PTT on  admission was mildly elevated at 49.  Hemoglobin on admission was 14.5,  hematocrit 42.7.  An EKG was also performed due to mild chest discomfort  following her procedure.  The EKG showed sinus bradycardia, rate of 52 beats  per minute with no signs of ischemia.  Her chest x-ray showed no active  cardiopulmonary disease.  This was performed on August 27, 2005.   DISCHARGE INSTRUCTIONS:  The patient was told to stay on aspirin 81 mg  daily, Plavix 75 mg daily and to resume her home medications which included  Prevacid 30 mg daily, Effexor 150 mg daily, Benicar, hydrochlorothiazide  40/25 1/2 tablet daily, Cartia 360 mg daily, Singulair 10 mg at bed time,  Advair 250/50 1 puff b.i.d., iron p.r.n., Caltrate p.r.n., Centrum p.r.n.,  Albuterol p.r.n.  The patient has also recently been treated with a steroid  Dosepak secondary to a respiratory tract infection.  The patient was told to  stay on a low cholesterol, low sodium diet.  She was given instructions  regarding wound care for her right groin wound.  She was told not to drive  or do anything strenuous for two weeks.  A follow-up angiogram was  recommended in three months.  The patient will see Dr. Corliss Skains in follow  up Wednesday September 19, 2005 at 3 p.m.  It was recommended that she follow up  with her primary care physician, Dr. Renne Crigler, in one to two weeks to have a  repeat urinalysis secondary to her hematuria while in the hospital.   DISCHARGE DIAGNOSES:  1.  Cerebrovascular disease, status post percutaneous transluminal coronary      angioplasty stenting using a Wingspan stent to the left middle cerebral      artery performed under general anesthesia on September 04, 2005 by Dr.      Corliss Skains.  2.  History of previous left cerebrovascular accident.  3.  History of hypertension.  4.  Gastroesophageal reflux disease. 5.  History of occasional palpitations.  6.  History  of depression.  7.  History of asthma.  8.  History of left lower extremity deep vein thrombosis  in the past.  9.  Recent dobutamine Cardiolite August 24, 2005, negative for ischemia.  10. Chest pain during this admission not felt to be cardiac with an      essentially normal electrocardiogram.  11. Mild hematuria while on IV heparin therapy.  Follow up recommended.  12. Allergies to Keflex, Septra and possibly caine-based medications.  13. Respiratory tract infection treated with prednisone and antibiotics.  14. Mildly elevated baseline PTT of uncertain significance.  15. History of arthritis and fibromyalgia.      Delton See, P.A.    ______________________________  Grandville Silos. Corliss Skains, M.D.    DR/MEDQ  D:  09/05/2005  T:  09/05/2005  Job:  [id number]   cc:   Soyla Murphy. Renne Crigler, M.D.  Fax: [phone]   Pearlean Brownie, M.D.

## 2010-10-13 NOTE — Consult Note (Signed)
Natalie Kent, Natalie Kent               ACCOUNT NO.:  [id number]   MEDICAL RECORD NO.:  [id number]          PATIENT TYPE:  OUT   LOCATION:  XRAY                         FACILITY:  [facility]   PHYSICIAN:  Delton See, P.A.   DATE OF BIRTH:  13-Nov-1930   DATE OF CONSULTATION:  09/19/2005  DATE OF DISCHARGE:                                   CONSULTATION   HISTORY:  This is a very pleasant 77 year old female with history of a left  middle cerebral artery CVA in November 200.  She initially had right-sided  deficits, however, these improved significantly since her stroke.  She had  an MRI/MRA on November 27 that showed multiple previous left-sided infarcts  as well as a possible severe left middle cerebral artery stenosis.  The  patient was referred to Dr. Corliss Skains, who saw the patient in consultation  on June 26, 2005.  Arrangements were made for her to return to [facility] on September 04, 2005, to undergo a cerebral angiogram and possible PTA  stenting of the left middle cerebral artery.  The artery was stented on the  day of admission under general anesthesia using a Wingspan stent.  The  patient tolerated the procedure well.  She returns today approximately two  weeks after the procedure to be seen in follow-up.  She is accompanied by  her family.   PAST MEDICAL HISTORY:  Significant for previous left CVAs, hypertension,  gastroesophageal reflux disease, palpitations, depression, asthma, previous  left lower extremity DVT. some cardiac problems with a negative dobutamine  Cardiolite performed August 24, 2005 by Dr.   PROCEDURE:  1.  Emergency left heart catheterization.  2.  Coronary cineangiography.  3.  Vein graft cineangiography.  4.  Left ventricular cine angiography.  5.  Bilateral selective renal artery angiography.  6.  Perclose of the right femoral artery.  7.  Aortic root cine angiography.   INDICATIONS FOR PROCEDURE:  This 77 year old female was seen in the ER at  [facility] and felt to have an acute anterior myocardial infarction  by the emergency room physician.  He was then transported by [facility] to  the [facility]ter[facility] for an emergency catheterization.  He has  a past history of coronary artery disease, two vessel, with coronary artery  bypass graft surgery in 1981 with vein grafts to his mid LAD and distal  right coronary artery.  He was later noted to have total occlusion of his  right coronary artery vein graft but with good collaterals to his distal  right from injections into the left coronary artery.  His last cardiac  catheterization was in 1997 when he again was noted to have chronic total  occlusion of his right coronary artery vein graft, and he was otherwise  stable with a patent LAD vein graft and only mild stenosis elsewhere.  His  left main was noted to be normal.  He was noted to have bilateral renal  artery stenosis at that time, and had bilateral renal stents placed by Dr.  Allyson Sabal.   DESCRIPTION OF PROCEDURE:  After  signing an informed consent, the patient  was transported from the emergency room at [facility] to the [facility]atheter[facility] Lab.  His left groin was prepped and draped in a  sterile fashion and anesthetized locally with 0.1% lidocaine.  A #6 French  introducer sheath was inserted percutaneously into the left femoral artery.  A 6 French #4 Judkins coronary catheters were used to make injections into  the native coronary arteries.  The right coronary catheter was used to make  injections into the LAD vein graft and side of the right coronary artery  vein graft.  A 6 French pigtail catheter was used to measure pressures in  the left ventricle and aorta and to make a midstream injection into the left  ventricle and aortic root.  The right coronary catheter was also used to  make selective injections into the right and left renal arteries.  The  patient tolerated the procedure  well, and no complications were noted.  At  the end of the procedure, the catheter and sheath were removed from the left  femoral artery and hemostasis was easily obtained with the Perclose closure  system.   MEDICATIONS GIVEN:  1.  Metoprolol 5 mg IV x3.  2.  Labetalol 20 mg IV.  3.  Nitroglycerin intracoronary 200 units.  4.  Morphine 2 mg IV.   HEMODYNAMIC DATA:  Left ventricular pressure 255/20-42.  Aortic pressure  256/89 with a mean of 153.  Left ventricular ejection 40%.   CINE FINDINGS:  Coronary cine angiography:   Left coronary artery:  The ostium has a critical 90% stenosis, which damps  severely when the catheter tip was engaged.   Left anterior descending:  The LAD is totally occluded in the middle segment  just distal to the first antral lateral branch.  There is a segmental plaque  in the proximal LAD with the severe stenosis of 70%.  The mid and distal LAD  fills by way of vein graft.   Circumflex coronary artery:  The circumflex has a critical stenosis in its  distal segment and a large second obtuse marginal branch.  There are three  focal stenotic lesions in this second OM branch, each lesion being 80-90%.   Right coronary artery:  The right coronary artery is chronically totally  occluded in its proximal segment.  The distal right coronary artery fills  retrograde during injections into the native left coronary artery and also  during injections into the distal LAD by way of vein graft.   VEIN GRAFT CINE ANGIOGRAPHY:  The right coronary artery vein graft is  chronically occluded at its origin.   Left anterior descending vein graft:  The LAD vein graft appears normal with  a normal distal anastomotic site.  Just distal to this anastomosis, the LAD  has a critical 90% stenosis which is focal and concentric.  The right  coronary artery fills readily during injections into this vein graft.  LEFT VENTRICULAR CINE ANGIOGRAM:  The left ventricular chamber size is  mild  to moderately enlarged.  The overall left ventricular contractility is  decreased with an ejection fraction estimated at 40%.  There is severe  inferior hypokinesia.  The anterior segment has normal function.  The mitral  valve has mild mitral insufficiency.   AORTIC ROOT CINE ANGIOGRAPHY:  The midstream injection into the root of the  aorta shows no evidence for aortic insufficiency.   RENAL ARTERIES:  There is mild restenosis within both renal artery  stent of  only 20-30%.  There is normal flow.   FINAL DIAGNOSES:  1.  Critical left main stenosis, new since prior study.  2.  Critical stenosis mid left anterior descending distal to the vein graft,      new since prior study.  3.  Critical stenosis distal circumflex and second obtuse marginal branch,      new since prior study.  4.  Chronic total occlusion of the mid left anterior descending distal to      the first anterolateral branch.  5.  Severe stenosis second anterior lateral branch filling from the vein      graft, new since prior study.  6.  Chronic total occlusion of the proximal right coronary artery with      retrograde filling from the left coronary artery.  7.  Chronic total occlusion of the right coronary artery vein graft.  8.  Moderate left ventricular dysfunction with ejection fraction 40% and      severe inferior hypokinesia.  9.  Normal aortic valve.  10. Mild mitral insufficiency.  11. Good long-term appearance of both renal artery stents with only mild      restenosis.  12. Successful Perclose of the right femoral artery.   DISPOSITION:  With the new critical ostial left main disease and new  stenosis in his mid LAD and distal circumflex, I feel that he is a candidate  for coronary artery bypass graft surgery and will continue treating  medically tonight.  Will continue on IV nitroglycerin drip and sedation.  Will also use the pharmacy protocol for heparin drip.  His blood pressure  was markedly  elevated over 240 throughout the procedure, and this did not  respond well to intravenous beta blockers and intracoronary nitroglycerin  and increased doses of intravenous nitroglycerin drip.  His renal arteries  showed normal flow and therefore was not the cause of his severe  hypertension.   ALLERGIES:  KEFLEX, SEPTRA and a questionable allergy to caine  medications.  She had syncope after using Solarcaine on a bad sunburn   SOCIAL HISTORY:  The patient is widowed.  She has five children.  She lives  in [place] with her mother who is 52 years old.  She has never used  tobacco or alcohol.  She is a retired Engineering geologist.   FAMILY HISTORY:  Her mother is alive and well at age 62.  Her father died at  age 40 from cancer.  She has a sister who died from breast cancer.   IMPRESSION AND PLAN:  As noted this patient underwent PTA stenting of the  left middle cerebral artery stenosis on September 04, 2005 by Dr. Corliss Skains under general anesthesia.  During her hospital stay, she did have some  hematuria while on heparin as well as some blood tinged sputum.  Her  baseline PTT was mildly elevated on admission; the significance of this is  uncertain.  The hematuria did improve.  After the heparin was discontinued  she has followed up with her primary care physician, Dr. Merri Brunette, and  apparently had a repeat urinalysis that did not show any significant  hematuria per the patient's history today   The patient remains on aspirin and Plavix and Dr. Corliss Skains as recommended  that she continue on these medications indefinitely, and a repeat cerebral  angiogram has been recommended for approximately three months.   The patient appears be doing well overall.  She still has occasional right-  sided headaches/  The patient's family feels that her speech is actually  better following the placement of the stent.  There has been no other  significant neurological symptoms.   The patient asked Dr.  Corliss Skains about some cataract surgery which had been  in the planning process for quite some time.  Dr. Corliss Skains recommended that  she wait until her three month angiogram before proceeding with the  cataract surgery and she would no doubt need to come off her Plavix and  possibly her aspirin for the surgery.  We will see her in approximately  three months for the angiogram.   Greater than 30 minutes was spent on this consult.      Delton See, P.A.     DR/MEDQ  D:  09/19/2005  T:  09/20/2005  Job:  [id number]   cc:   Dr. Jovita Kussmaul D. Renne Crigler, M.D.  Fax: [phone]

## 2010-10-13 NOTE — Consult Note (Signed)
Natalie Kent, PACIFICO               ACCOUNT NO.:  [id number]   MEDICAL RECORD NO.:  [id number]          PATIENT TYPE:  OUT   LOCATION:  XRAY                         FACILITY:  [facility]   PHYSICIAN:  Sanjeev K. Deveshwar, M.D.DATE OF BIRTH:  Dec 30, 1930   DATE OF CONSULTATION:  06/26/2005  DATE OF DISCHARGE:                                   CONSULTATION   CHIEF COMPLAINT:  Left middle cerebral artery stenosis.   HISTORY OF PRESENT ILLNESS:  This is a pleasant 77 year old female with a  history of a left middle cerebral artery CVA in November 2006. The patient  had mild residual right-sided deficits which have significantly improved.  She had an MRI/MRA on April 23, 2005, that showed multiple previous left-  sided infarcts as well as severe left middle cerebral artery stenosis. The  patient has been referred to Dr. Corliss Skains for further evaluation.   PAST MEDICAL HISTORY:  Significant for previous left CVA, a history of  hypertension, history of gastroesophageal reflux disease. She has noted  occasional palpitations. She has a history of depression, a history of  asthma. She has had a previous blood clot in her left lower extremity. She  had previously reported a history of a spot on my heart. However, she does  not give any history of cardiac issues today except for occasional  palpitations.   SURGICAL HISTORY:  Significant for a cholecystectomy, hysterectomy, previous  abdominal surgery, hernia repair, and bilateral knee surgery.   ALLERGIES:  KEFLEX and SEPTRA, as well as SOLARCAINE and any other CAINE-  BASED medications.   CURRENT MEDICATIONS:  The patient did not bring her medications today.  However, her medications at the time of discharge in November included:  1.  Prevacid 30 mg daily.  2.  Effexor XR 150 mg daily.  3.  Advair 250/50 one puff twice daily.  4.  Hyzaar 25/100 one daily.  5.  Singulair 10 mg daily.  6.  Calcium and vitamin D 500 mg daily.  7.  Taztia XT  120 mg daily.  8.  Plavix 75 mg daily.  9.  Aspirin 81 mg daily.  10. Multivitamin daily.   SOCIAL HISTORY:  The patient is widowed. She has five children. She lives in  [place] with her mother, who is 33 years old. She has never used alcohol  or tobacco to any significant degree. She is a retired Administrator, sports.   FAMILY HISTORY:  Her mother is alive and well at age 41. Her father died at  age 51 from cancer. She has a sister who died from breast cancer.   IMPRESSION AND PLAN:  Dr. Corliss Skains met with the patient as well as two of  her daughters today to discuss her MRI and MRA findings. The images were  reviewed on the computer. Dr. Corliss Skains pointed out the area of concern. He  felt that the patient had an approximate 95% stenosis of the left middle  cerebral artery. He recommended an angiogram evaluation as well as possible  PTA stenting of the stenosis. The risks and benefits of the procedures were  described  in detail, along with the risks and benefits of an angiogram, as  well as of the possible intervention. Dr. Corliss Skains asked the patient and  her family to go home and think about this before deciding whether or not  she wanted to proceed. We will contact the patient at home in the next day  or two regarding her decision and possible scheduling of the angiogram and  intervention.   Greater than 40 minutes were spent on this consult.      Delton See, P.A.    ______________________________  Grandville Silos. Corliss Skains, M.D.    DR/MEDQ  D:  06/26/2005  T:  06/26/2005  Job:  [id number]   cc:   Pramod P. Pearlean Brownie, MD  Fax: [phone]   Soyla Murphy. Renne Crigler, M.D.  Fax: [phone]

## 2010-10-13 NOTE — Discharge Summary (Signed)
Natalie Kent, Natalie Kent               ACCOUNT NO.:  [id number]   MEDICAL RECORD NO.:  [id number]          PATIENT TYPE:  INP   LOCATION:  3007                         FACILITY:  [facility]   PHYSICIAN:  Pramod P. Pearlean Brownie, MD    DATE OF BIRTH:  01/05/1931   DATE OF ADMISSION:  04/23/2005  DATE OF DISCHARGE:  04/26/2005                                 DISCHARGE SUMMARY   DIAGNOSES AT TIME OF DISCHARGE:  1.  Left middle cerebral artery branch infarct secondary to left middle      cerebral artery stenosis.  2.  Hypertension.  3.  Gastroesophageal reflux disease.  4.  Depression.  5.  Asthma.  6.  In patient's own words, an acinetic spot on her heart.   MEDICINES AT TIME OF DISCHARGE:  1.  Prevacid 30 mg a day.  2.  Effexor XR 150 mg a day.  3.  Advair 250/50 inhaler twice daily.  4.  Hyzaar 25/100 mg daily.  5.  Singulair 10 mg daily.  6.  Calcium +D 500 mg daily.  7.  Taztia XT 120 mg daily.  8.  Plavix 75 mg a day.  9.  Aspirin 81 mg a day.  10. Multivitamin one a day.   STUDIES PERFORMED:  1.  CT 10 days prior to admission shows no acute abnormalities.  2.  MRI of the brain shows multiple left-sided cerebral infarcts of varying      ages; most acute infarct appears to be in a watershed distribution.      There is likely some shift of the watershed towards the middle cerebral      artery, subacute infarction involving the left caudate head and lateral      aspect of the left temporal lobe.  The caudate lesion is 1-2 weeks' old.      The temporal lesion is slightly older, but less than 3 months.  The area      of enhancement in the lateral watershed is likely in the range of 1-2      weeks.  Additional periventricular and subcortical white matter disease      likely reflects sequelae of more chronic microvascular ischemia.  3.  MRA of the head shows severe distal left M1 segment MCA stenosis with      significant attenuation of the distal vasculature.  4.  MRA of the neck shows arch  origin of the left vertebral artery.      Significant artifact throughout the region of the subclavian arteries      limits proximal evaluation.  Irregularity of the distal left common and      proximal left internal carotid artery may represent stenosis, although      appears to be some motion artifact.  Severe distal left M1 segment      stenosis and attenuation of the left MCA branches beyond the      bifurcation.  5.  Followup CT 24 hours after admission shows no evidence of hemorrhage or      mass effect.  Minimal changes in overall definition of gray-white  margins and definition of left frontoparietal region.  6.  EKG shows normal sinus rhythm.  7.  Carotid Dopplers is normal.  8.  Two-dimensional echocardiogram shows ejection fraction of 55% to 65%      with no diagnostic left ventricular region wall motion abnormalities.      No embolic source.   LABORATORY STUDIES:  1.  Homocysteine 11.6.  2.  CBC with hemoglobin 10.8, hematocrit 32.0, RDW 14.9, otherwise normal,      differential normal.  3.  Coagulation studies normal.  4.  Chemistry normal.  5.  Liver function tests normal.  6.  Hemoglobin A1c 4.5.  7.  Cholesterol 133, triglycerides 176, HDL 42 and LDL 66.  8.  Urinalysis with moderate leukocyte esterase and 11-20 white blood cells,      0-2 red blood cells and a few epithelials.  9.  Urine culture after 72 hours is negative for growth.   HISTORY OF PRESENT ILLNESS:  Ms. Natalie Kent is a 77 year old right-handed  white female who lives with her mother.  On admission, the family reported  the patient had some right arm pain and right-sided numbness with clumsiness  that began at 2-1/2 weeks prior to being seen in the emergency department.  She saw her primary care physician just prior to Thanksgiving and he treated  her at that time for a GI virus.  A CT of the head done on April 07, 2005  in the [facility] Room was unremarkable.  On Friday of the  week  following Thanksgiving, she again presented to [facility] with pain in her  right arm and her right face drooping.  She was again discharged with the  assumption that she was dehydrated from her GI virus.  A followup  appointment was made with Dr. Renne Crigler.  That weekend she was able to function  independently without deficits.  Again, that following Sunday evening, her  speech again became slurred and she called her primary care physician's  office.  She ended up coming to the emergency room instead of seeing him.   In the emergency room, the patient was evaluated with specific symptoms of  aphasia and right-sided weakness and clumsiness.  An MRI was reviewed and  there does appear to be multiple left hemispheric infarcts that appear to be  watershed called by left side branch stenosis of the left middle cerebral  artery.  The patient was started on IV heparin and admitted to the hospital  for further evaluation.   HOSPITAL COURSE:  The patient remained stable in the hospital with no  worsening of symptoms for 48 hours.  Complete stroke workup was done and  stroke was felt to be secondary to left M1 stenosis.  She was then changed  to low-dose aspirin and Plavix for secondary stroke prevention with thoughts  of a followup angiogram in approximately 4 weeks with consideration of left  MCA angioplasty versus stent if it remains open.  PT, OT and Speech Therapy  all evaluated the patient in the hospital.  She was able to tolerate a  regular thin liquid diet.  She did have some expressive aphasia and  treatment was initiated.  With therapy, she was able to ambulate 150 feet  with minimal guard assistance with varying speeds and stops, and turning.  She was independent in her sitting and bed mobility, and close supervision  with transfers.  She had difficulty with decreased p.o. __________ items.  The patient was unable to  pick up objects off the floor secondary to bad   back.  Occupational therapy:  She mainly functioned with minimal assistance with  self-dressing, required maximal assistance __________  to include  supervision with setup and bath and independent grooming.  She was  discharged with a 5-inch rolling walker, a reacher and a sock aid.  Recommendations have been made and put in place for outpatient PT, OT and  speech therapy.   CONDITION AT DISCHARGE:  The patient is alert, oriented to person, place and  situation.  She has nonfluent word hesitancy.  She can name.  She can  repeat.  Her eye movements are full with no gaze deficit.  She does have  right facial weakness.  She has no upper extremity drift.  Her right hand  grip is weak and orbits the left over the right.  She has mild 4+/5 weakness  in her right hip flexors.  She has no sensory loss or neglect.  Her chest is  clear to auscultation.  Her heart rate is regular.   DISCHARGE PLANS:  1.  Discharged home.  The patient lives with her mother, who is independent.      The patient's daughter will be staying with her at time of discharge;      her name is Cephus Richer.  2.  Low-dose aspirin and Plavix for secondary stroke prevention.  3.  Outpatient PT, OT and speech therapy.  4.  Rolling walker with 5-inch wheels.  5.  Follow up with Dr. Merri Brunette 1 month after discharge.  6.  Follow up with Annie Main, nurse practitioner, at [facility]      in 1 month.      Annie Main, N.P.    ______________________________  Sunny Schlein. Pearlean Brownie, MD    SB/MEDQ  D:  04/26/2005  T:  04/27/2005  Job:  [id number]   cc:   Soyla Murphy. Renne Crigler, M.D.  Fax: [phone], [place], [place] [facility],

## 2010-10-13 NOTE — H&P (Signed)
Natalie Kent, Natalie Kent               ACCOUNT NO.:  [id number]   MEDICAL RECORD NO.:  [id number]           PATIENT TYPE:   LOCATION:                                 FACILITY:   PHYSICIAN:  Sanjeev K. Deveshwar, M.D.DATE OF BIRTH:  [birth year]   DATE OF ADMISSION:  09/04/2005  DATE OF DISCHARGE:                                HISTORY & PHYSICAL   CHIEF COMPLAINT:  Cerebrovascular disease.   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  The  patient had mild residual right-sided deficits which apparently have  significantly improved.  She had an MRI/MRA on April 23, 2005 which  showed multiple previous left-sided infarcts as well as severe left middle  cerebral artery stenosis.  The patient has been referred to Dr. Corliss Skains  for further evaluation.  She met with him on June 26, 2005 to discuss  treatment options.  A decision has been made to perform a cerebral angiogram  with possible PTA stenting of the left middle cerebral artery.  The patient  presents today for these procedures.   PAST MEDICAL HISTORY:  1.  Significant for the above-noted left CVA.  2.  Hypertension.  3.  Gastroesophageal reflux disease.  4.  Occasional palpitations.  5.  History of depression.  6.  History of asthma.  7.  She had what sounds like a left lower extremity DVT in the past.  8.  She had some cardiac problems the nature of which are unclear.  She      recently had a dobutamine Cardiolite performed August 24, 2005 by Dr.      Aleen Campi.  This was felt to be a low risk study with no ischemia noted.   ALLERGIES:  KEFLEX and SEPTRA.  She states she is allergic to [facility] and  possibly other CAINE-BASED MEDICATIONS.  She states her reaction was that  she had passed out after applying Solarcaine; however, she had a severe  sunburn at the time.  The true nature of her allergy is unclear.   CURRENT  MEDICATIONS:  1.  Prednisone 5 mg q.a.m.  2.  Plavix 75 mg daily.  3.  Prevacid 30 mg daily.  4.  Aspirin 81 mg daily.  5.  Effexor 150 mg daily.  6.  Benicar/HCT 40/25 one-half tablet daily.  7.  Cartia 240 mg daily.  8.  Taztia XT 120 mg daily.  9.  Singulair 10 mg at bedtime.  10. Advair 250/50 one puff b.i.d.  11. Iron p.r.n.  12. Caltrate p.r.n.  13. Centrum p.r.n.  14. Albuterol p.r.n.  15. Patient also reports that she recently completed a steroid dose pack for      a respiratory tract infection.   SOCIAL HISTORY:  The patient is widowed.  She has five children.  She lives  in [place] with her mother who is 48 years old.  She has never used  alcohol or tobacco to any significant degree.  She is a retired Forensic scientist.  FAMILY HISTORY:  Her mother is alive and well at age 75.  Her father died at  age 54 from cancer.  She has a sister who died from breast cancer.   LABORATORY DATA:  An INR today is 1.  A PTT is 49.  A CBC reveals hemoglobin  14.5, hematocrit 42.7, WBC are 15.5, platelets are 401,000.  BUN is 19,  creatinine 1, potassium 3.5, sodium 133, glucose 124.  A repeat PTT and CBC  are pending.   REVIEW OF SYSTEMS:  The patient denies any recent chest pain or shortness of  breath.  As noted, she had a recent respiratory tract infection, although  she reports this is cleared up.  She had some wheezing during the infection  but she states this has resolved as well as her cough and sputum.  The  patient also reports some arthritis and fibromyalgia.  She bruises easily  since being on Plavix.  Otherwise, her review of systems is negative.  She  denies any significant weakness at this time from her CVA.   PHYSICAL EXAMINATION:  GENERAL:  Pleasant 78 year old white female in no  acute distress.  VITAL SIGNS:  Blood pressure 153/92, pulse 85, respirations 16, temperature  97.2.  HEENT:  Unremarkable.  NECK:  No bruits.  No jugular venous distention.  HEART:   Regular rate and rhythm without murmur.  LUNGS:  Clear.  ABDOMEN:  Soft, nontender.  EXTREMITIES:  Pulses intact without edema.  There is an old stasis ulcer  scar in the inner aspect of her left lower extremity.  NEUROLOGIC:  Mental status:  The patient is alert and oriented and follows  commands.  Cranial nerves II-XII are grossly intact.  Sensation is intact to  light touch.  Motor strength is 4-5/5 throughout.  Cerebellar testing is  intact.  Her airway is rated as a 1.  Her ASA scale is rated at a 3.   IMPRESSION:  1.  Left middle cerebral artery stenosis with previous left middle cerebral      artery cerebrovascular accident in November 2006.  2.  History of hypertension.  3.  History of gastroesophageal reflux disease.  4.  History of asthma.  5.  Recent respiratory tract infection treated with antibiotics and a dose      pack of steroids currently on prednisone.  6.  History of left lower extremity deep venous thrombosis.  7.  Recent dobutamine Cardiolite negative for ischemia.  8.  History of depression.  9.  History of palpitations.  10. Allergies to KEFLEX, SEPTRA, and possibly CAINE-BASED MEDICATIONS.  11. Elevated white blood cell count felt to be due to her prednisone.  12. Mildly elevated PTT of uncertain significance.  13. History of arthritis and fibromyalgia.   PLAN:  The patient has received vancomycin, nimodipine, aspirin, and Plavix  as well as her blood pressure medications in preparation for her procedures  today.  A cerebral angiogram will be performed the results of which will be  discussed with the patient and her family with plans to proceed with PTA  stenting of the left middle cerebral artery if felt to be indicated and  safe.      Delton See, P.A.    ______________________________  Grandville Silos. Corliss Skains, M.D.    DR/MEDQ  D:  09/04/2005  T:  09/04/2005  Job:  [id number]   cc:   Pramod P. Pearlean Brownie, MD  Fax: [phone]  Soyla Murphy. Renne Crigler, M.D.   Fax: [phone]

## 2011-01-23 ENCOUNTER — Emergency Department (HOSPITAL_BASED_OUTPATIENT_CLINIC_OR_DEPARTMENT_OTHER)
Admission: EM | Admit: 2011-01-23 | Discharge: 2011-01-23 | Disposition: A | Discharge status: Home / Self Care | Payer: PRIVATE HEALTH INSURANCE | Attending: Emergency Medicine | Admitting: Emergency Medicine

## 2011-01-23 ENCOUNTER — Emergency Department (INDEPENDENT_AMBULATORY_CARE_PROVIDER_SITE_OTHER): Payer: PRIVATE HEALTH INSURANCE

## 2011-01-23 DIAGNOSIS — Y92009 Unspecified place in unspecified non-institutional (private) residence as the place of occurrence of the external cause: Secondary | ICD-10-CM | POA: Insufficient documentation

## 2011-01-23 DIAGNOSIS — Z86718 Personal history of other venous thrombosis and embolism: Secondary | ICD-10-CM | POA: Insufficient documentation

## 2011-01-23 DIAGNOSIS — I10 Essential (primary) hypertension: Secondary | ICD-10-CM | POA: Insufficient documentation

## 2011-01-23 DIAGNOSIS — Z8679 Personal history of other diseases of the circulatory system: Secondary | ICD-10-CM | POA: Insufficient documentation

## 2011-01-23 DIAGNOSIS — M898X9 Other specified disorders of bone, unspecified site: Secondary | ICD-10-CM

## 2011-01-23 DIAGNOSIS — S93409A Sprain of unspecified ligament of unspecified ankle, initial encounter: Secondary | ICD-10-CM | POA: Insufficient documentation

## 2011-01-23 DIAGNOSIS — R609 Edema, unspecified: Secondary | ICD-10-CM

## 2011-01-23 DIAGNOSIS — X58XXXA Exposure to other specified factors, initial encounter: Secondary | ICD-10-CM | POA: Insufficient documentation

## 2011-01-23 DIAGNOSIS — J45909 Unspecified asthma, uncomplicated: Secondary | ICD-10-CM | POA: Insufficient documentation

## 2011-01-23 DIAGNOSIS — M25579 Pain in unspecified ankle and joints of unspecified foot: Secondary | ICD-10-CM

## 2011-01-23 DIAGNOSIS — Z8739 Personal history of other diseases of the musculoskeletal system and connective tissue: Secondary | ICD-10-CM | POA: Insufficient documentation

## 2011-01-23 HISTORY — DX: Cerebral infarction, unspecified: I63.9

## 2011-01-23 HISTORY — DX: Essential (primary) hypertension: I10

## 2011-01-23 HISTORY — DX: Unspecified osteoarthritis, unspecified site: M19.90

## 2011-01-23 HISTORY — DX: Acute embolism and thrombosis of unspecified deep veins of unspecified lower extremity: I82.409

## 2011-01-23 MED ORDER — ACETAMINOPHEN 500 MG PO TABS: 1000.0000 mg | ORAL_TABLET | Freq: Once | ORAL | Status: DC

## 2011-01-23 MED ORDER — IBUPROFEN 600 MG PO TABS: 600.0000 mg | ORAL_TABLET | Freq: Three times a day (TID) | ORAL | Status: AC

## 2011-01-23 MED ORDER — HYDROCODONE-ACETAMINOPHEN 5-325 MG PO TABS: 1.0000 | ORAL_TABLET | Freq: Four times a day (QID) | ORAL | Status: AC | PRN

## 2011-01-23 MED ORDER — MORPHINE SULFATE 2 MG/ML IJ SOLN
2.0000 mg | Freq: Once | INTRAMUSCULAR | Status: AC
  Administered 2011-01-23: 2 mg via INTRAVENOUS
  Filled 2011-01-23: qty 1

## 2011-01-23 NOTE — ED Notes (Signed)
Pt. In no signs of acute distress, leaving with granddaughter via WC, leaving with 2 x Rx., pt. & family denies questions, air splint applied to RLE prior to DC, pt. Tolerated well

## 2011-01-23 NOTE — ED Notes (Signed)
Pain medication given with NS, pt. Tolerated well, pt educated on side effects of meds, instructed not to get out of bed without assistance, call bell in reach, family at bedside, ice applied at 11:40

## 2011-01-23 NOTE — ED Notes (Signed)
Pt. R leg warm to touch, measurement of L calf is 1/2 difference, Radial & DP pulses equal, moderate bilaterally

## 2011-01-23 NOTE — ED Notes (Signed)
Pt. C/o R foot pain that radiates to leg, started yesterday when pt. Woke up & has been constant ever since, pt. Hx of DVT on Plavix 75 mg daily for stent placement, LOC unchanged, alert x 4

## 2011-01-23 NOTE — ED Notes (Signed)
States onset of right foot/leg pain began yesterday am when ambulating.  Denies injury.

## 2011-01-23 NOTE — ED Provider Notes (Signed)
History    chief complaint: Right foot pain. The patient notes that she was in her usual state of health until yesterday morning, approximately 30 hours prior to presentation. She recalls that after she stepped out of bed she felt the onset of pain in her right foot and ankle. Since onset the pain has increased, notably this am prior to arrival. The pain is severe focally about the lateral right ankle with radiation to the proximal shin. No distal numbness weakness or tingling. No proximal (see me in approximately) complaints. No fevers. No chills. No skin changes. No similar events in the past. No alleviation with anything. Pain is worse with motion.  CSN: [id number] Arrival date & time: 01/23/2011 10:08 AM  Chief Complaint  Patient presents with  . Foot Pain   HPI  Past Medical History  Diagnosis Date  . DVT (deep venous thrombosis)   . Asthma   . Arthritis   . Stroke   . Hypertension     Past Surgical History  Procedure Date  . Abdominal hysterectomy   . Cholecystectomy   . Knee surgery   . Endovascular stent insertion     History reviewed. No pertinent family history.  History  Substance Use Topics  . Smoking status: Never Smoker   . Smokeless tobacco: Never Used  . Alcohol Use: No    OB History    Grav Para Term Preterm Abortions TAB SAB Ect Mult Living                  Review of Systems  Constitutional: Negative for fever and chills.  HENT: Negative.   Eyes: Negative.   Respiratory: Negative.   Cardiovascular: Negative.   Gastrointestinal: Negative.   Genitourinary: Negative.   Hematological: Negative.     Physical Exam  BP 143/76  Pulse 95  Temp(Src) 99.5 F (37.5 C) (Oral)  Resp 16  Ht 5\' 3"  (1.6 m)  Wt 193 lb (87.544 kg)  BMI 34.19 kg/m2  SpO2 96%  Physical Exam  Constitutional: She appears well-developed and well-nourished.  HENT:  Head: Normocephalic and atraumatic.  Eyes: Conjunctivae and EOM are normal.  Cardiovascular: Normal rate  and regular rhythm.   Pulmonary/Chest: Effort normal.  Musculoskeletal:       R shin w/o appreciable erythema, though there are several age appropriate discolored areas.  R ankle w edema and ttp about the lateral mal.  No med mal ttp or edema.  No sig forefoot ttp.  Distaly she is n/v intact    ED Course  Procedures XR ankle: soft tissue edema near lat mal  MDM Patient presents with 2 days of her ankle pain focally about the lateral malleolus. Pain is worse with motion. Prior minimally with medications. X-ray does not demonstrate fracture. There is soft tissue swelling about the lateral malleolus. On exam there is no clinical signs of infection; no erythema, no ecchymosis, no other edema. Point tenderness on exam for further suggests sprain. Patient has a walker at home. She'll be discharged in an Aircast at analgesia with a followup Dr. Yisroel Ramming this Friday and      Gerhard Munch, MD 01/23/11 1340

## 2011-02-15 LAB — CREATININE, SERUM: GFR calc Af Amer: >60

## 2011-02-22 LAB — CREATININE, SERUM
Creatinine, Ser: 0.78
GFR calc Af Amer: >60
GFR calc non Af Amer: >60

## 2011-02-22 LAB — BUN: BUN: 15

## 2011-03-08 LAB — BASIC METABOLIC PANEL
CO2: 24
CO2: 26
Calcium: 8.6
Calcium: 8.7
Chloride: 105
Chloride: 106
Creatinine, Ser: 0.61
Creatinine, Ser: 0.61
GFR calc Af Amer: >60
GFR calc Af Amer: >60
GFR calc Af Amer: >60
GFR calc non Af Amer: >60
Glucose, Bld: 114 — ABNORMAL HIGH
Potassium: 3.3 — ABNORMAL LOW
Sodium: 136
Sodium: 140

## 2011-03-08 LAB — CROSSMATCH: Antibody Screen: NEGATIVE

## 2011-03-08 LAB — CBC
HCT: 27.5 — ABNORMAL LOW
HCT: 28 — ABNORMAL LOW
HCT: 31.7 — ABNORMAL LOW
Hemoglobin: 10.5 — ABNORMAL LOW
Hemoglobin: 8.6 — ABNORMAL LOW
Hemoglobin: 9.1 — ABNORMAL LOW
Hemoglobin: 9.2 — ABNORMAL LOW
MCHC: 32.7
MCHC: 33
MCV: 77.8 — ABNORMAL LOW
MCV: 79.2
Platelets: 324
RBC: 3.29 — ABNORMAL LOW
RBC: 3.53 — ABNORMAL LOW
RBC: 3.91
RBC: 4
RDW: 15.8 — ABNORMAL HIGH
WBC: 12.1 — ABNORMAL HIGH
WBC: 6.7
WBC: 7.5
WBC: 9.7

## 2011-03-08 LAB — PROTIME-INR: INR: 1.1

## 2011-03-08 LAB — APTT: aPTT: 86 — ABNORMAL HIGH

## 2011-03-08 LAB — CK TOTAL AND CKMB (NOT AT ARMC)
CK, MB: 1.6
CK, MB: 1.8
CK, MB: 1.9
Relative Index: INVALID
Total CK: 61
Total CK: 68

## 2011-03-08 LAB — TROPONIN I: Troponin I: 0.02

## 2011-03-09 LAB — BASIC METABOLIC PANEL
BUN: 14
BUN: 17
CO2: 30
CO2: 32
Calcium: 9.8
Chloride: 104
Creatinine, Ser: 0.75
Glucose, Bld: 121 — ABNORMAL HIGH
Glucose, Bld: 98
Potassium: 3.5
Sodium: 139

## 2011-03-09 LAB — CBC
HCT: 32.5 — ABNORMAL LOW
Hemoglobin: 10.7 — ABNORMAL LOW
MCHC: 33.1
MCV: 79.5
MCV: 78.8
Platelets: 376
RDW: 15.4 — ABNORMAL HIGH
WBC: 7.8

## 2011-03-09 LAB — DIFFERENTIAL
Eosinophils Relative: 2
Lymphocytes Relative: 27
Lymphs Abs: 2.1
Monocytes Absolute: 0.6

## 2011-03-09 LAB — PROTIME-INR: Prothrombin Time: 13.1

## 2011-03-09 LAB — APTT: aPTT: 65 — ABNORMAL HIGH

## 2011-03-23 ENCOUNTER — Other Ambulatory Visit (HOSPITAL_COMMUNITY): Payer: Self-pay | Admitting: Interventional Radiology

## 2011-03-23 DIAGNOSIS — I639 Cerebral infarction, unspecified: Secondary | ICD-10-CM

## 2011-03-30 ENCOUNTER — Ambulatory Visit (HOSPITAL_COMMUNITY)
Admission: RE | Admit: 2011-03-30 | Discharge: 2011-03-30 | Disposition: A | Discharge status: Home / Self Care | Payer: PRIVATE HEALTH INSURANCE | Source: Ambulatory Visit | Attending: Interventional Radiology | Admitting: Interventional Radiology

## 2011-03-30 DIAGNOSIS — Z09 Encounter for follow-up examination after completed treatment for conditions other than malignant neoplasm: Secondary | ICD-10-CM | POA: Insufficient documentation

## 2011-03-30 DIAGNOSIS — I639 Cerebral infarction, unspecified: Secondary | ICD-10-CM

## 2011-03-30 DIAGNOSIS — I6789 Other cerebrovascular disease: Secondary | ICD-10-CM | POA: Insufficient documentation

## 2011-03-30 DIAGNOSIS — Z8673 Personal history of transient ischemic attack (TIA), and cerebral infarction without residual deficits: Secondary | ICD-10-CM | POA: Insufficient documentation

## 2011-05-09 ENCOUNTER — Other Ambulatory Visit: Payer: Self-pay | Admitting: Internal Medicine

## 2011-05-09 DIAGNOSIS — M25571 Pain in right ankle and joints of right foot: Secondary | ICD-10-CM

## 2011-05-13 ENCOUNTER — Ambulatory Visit
Admission: RE | Admit: 2011-05-13 | Discharge: 2011-05-13 | Disposition: A | Discharge status: Home / Self Care | Payer: PRIVATE HEALTH INSURANCE | Source: Ambulatory Visit | Attending: Internal Medicine | Admitting: Internal Medicine

## 2011-05-13 DIAGNOSIS — M25571 Pain in right ankle and joints of right foot: Secondary | ICD-10-CM

## 2011-05-16 ENCOUNTER — Ambulatory Visit
Admission: RE | Admit: 2011-05-16 | Discharge: 2011-05-16 | Disposition: A | Discharge status: Home / Self Care | Payer: Medicare Other | Source: Ambulatory Visit | Attending: Internal Medicine | Admitting: Internal Medicine

## 2011-07-16 ENCOUNTER — Telehealth (HOSPITAL_COMMUNITY): Payer: Self-pay

## 2011-08-10 NOTE — Telephone Encounter (Signed)
                           Contacts         Type  Contact  Phone    07/16/2011 8:55 AM  Phone (Outgoing)  Pooler, Magdalynn Davilla (Self)  [phone] (H)    Completed- Spoke w/ pt made her aware MRI/MRA looked fine Dr. Corliss Skains will see her in a year for f/u mri/mri unless she is asymptomatic

## 2012-03-04 ENCOUNTER — Other Ambulatory Visit (HOSPITAL_COMMUNITY): Payer: Self-pay | Admitting: Interventional Radiology

## 2012-03-04 DIAGNOSIS — IMO0001 Reserved for inherently not codable concepts without codable children: Secondary | ICD-10-CM

## 2012-03-04 DIAGNOSIS — R531 Weakness: Secondary | ICD-10-CM

## 2012-03-04 DIAGNOSIS — F809 Developmental disorder of speech and language, unspecified: Secondary | ICD-10-CM

## 2012-03-04 DIAGNOSIS — Z8679 Personal history of other diseases of the circulatory system: Secondary | ICD-10-CM

## 2012-03-06 ENCOUNTER — Emergency Department (HOSPITAL_COMMUNITY)
Admission: EM | Admit: 2012-03-06 | Discharge: 2012-03-06 | Disposition: A | Discharge status: Home / Self Care | Payer: Medicare Other | Attending: Emergency Medicine | Admitting: Emergency Medicine

## 2012-03-06 ENCOUNTER — Encounter (HOSPITAL_COMMUNITY): Payer: Self-pay | Admitting: Emergency Medicine

## 2012-03-06 ENCOUNTER — Emergency Department (HOSPITAL_COMMUNITY): Payer: Medicare Other

## 2012-03-06 DIAGNOSIS — J45909 Unspecified asthma, uncomplicated: Secondary | ICD-10-CM | POA: Insufficient documentation

## 2012-03-06 DIAGNOSIS — M129 Arthropathy, unspecified: Secondary | ICD-10-CM | POA: Insufficient documentation

## 2012-03-06 DIAGNOSIS — Z79899 Other long term (current) drug therapy: Secondary | ICD-10-CM | POA: Insufficient documentation

## 2012-03-06 DIAGNOSIS — Z8673 Personal history of transient ischemic attack (TIA), and cerebral infarction without residual deficits: Secondary | ICD-10-CM | POA: Insufficient documentation

## 2012-03-06 DIAGNOSIS — R05 Cough: Secondary | ICD-10-CM | POA: Insufficient documentation

## 2012-03-06 DIAGNOSIS — R072 Precordial pain: Secondary | ICD-10-CM | POA: Insufficient documentation

## 2012-03-06 DIAGNOSIS — R079 Chest pain, unspecified: Secondary | ICD-10-CM

## 2012-03-06 DIAGNOSIS — R059 Cough, unspecified: Secondary | ICD-10-CM | POA: Insufficient documentation

## 2012-03-06 DIAGNOSIS — I10 Essential (primary) hypertension: Secondary | ICD-10-CM | POA: Insufficient documentation

## 2012-03-06 DIAGNOSIS — Z86718 Personal history of other venous thrombosis and embolism: Secondary | ICD-10-CM | POA: Insufficient documentation

## 2012-03-06 DIAGNOSIS — Z7982 Long term (current) use of aspirin: Secondary | ICD-10-CM | POA: Insufficient documentation

## 2012-03-06 LAB — CBC WITH DIFFERENTIAL/PLATELET
Basophils Absolute: 0 10*3/uL (ref 0.0–0.1)
Lymphocytes Relative: 24 % (ref 12–46)
Lymphs Abs: 1.6 10*3/uL (ref 0.7–4.0)
Neutrophils Relative %: 63 % (ref 43–77)
Platelets: 262 10*3/uL (ref 150–400)
RBC: 3.78 MIL/uL — ABNORMAL LOW (ref 3.87–5.11)
RDW: 13.5 % (ref 11.5–15.5)
WBC: 6.6 10*3/uL (ref 4.0–10.5)

## 2012-03-06 LAB — D-DIMER, QUANTITATIVE: D-Dimer, Quant: 0.61 ug/mL-FEU — ABNORMAL HIGH (ref 0.00–0.48)

## 2012-03-06 LAB — BASIC METABOLIC PANEL
CO2: 30 mEq/L (ref 19–32)
GFR calc non Af Amer: 46 mL/min — ABNORMAL LOW (ref >90)
Glucose, Bld: 85 mg/dL (ref 70–99)
Potassium: 3.5 mEq/L (ref 3.5–5.1)
Sodium: 140 mEq/L (ref 135–145)

## 2012-03-06 LAB — POCT I-STAT TROPONIN I: Troponin i, poc: 0.02 ng/mL (ref 0.00–0.08)

## 2012-03-06 LAB — PROTIME-INR
INR: 1.01 (ref 0.00–1.49)
Prothrombin Time: 13.2 seconds (ref 11.6–15.2)

## 2012-03-06 MED ORDER — NITROGLYCERIN 0.4 MG SL SUBL
0.4000 mg | SUBLINGUAL_TABLET | SUBLINGUAL | Status: DC | PRN
  Administered 2012-03-06 (×2): 0.4 mg via SUBLINGUAL
  Filled 2012-03-06: qty 25

## 2012-03-06 MED ORDER — SODIUM CHLORIDE 0.9 % IV SOLN
1000.0000 mL | Freq: Once | INTRAVENOUS | Status: AC
  Administered 2012-03-06: 1000 mL via INTRAVENOUS

## 2012-03-06 MED ORDER — ASPIRIN 81 MG PO CHEW
324.0000 mg | CHEWABLE_TABLET | Freq: Once | ORAL | Status: AC
  Administered 2012-03-06: 324 mg via ORAL
  Filled 2012-03-06: qty 4

## 2012-03-06 NOTE — ED Notes (Signed)
Pt c/o midsternal to left sided CP that is burning in nature x 3 days; pt denies SOB and nausea

## 2012-03-06 NOTE — ED Provider Notes (Signed)
History     CSN: [id number]  Arrival date & time 03/06/12  1021   First MD Initiated Contact with Patient 03/06/12 1151      Chief Complaint  Patient presents with  . Chest Pain    (Consider location/radiation/quality/duration/timing/severity/associated sxs/prior treatment) HPI  Natalie Kent is a 78 y.o. female c/o intermittent, substernal chest pain x48 hours, described as "burning", rated at 4/10 at worst and 2/10 now, radiating to left chest, associated with productive cough. Pain non-exertional, non-pleuritic or positional. Denies SOB, N/V, diaphoresis, cough, fever, back pain, syncope, prior episodes. Denies recent travel, leg swelling, hemoptysis.  Pt has not received any NTG or ASA today. Pt has h/o DVT in the 1970's, she is taking plavix for CVA.   RF: HTN Cath: Never  Last Stress test:  5 years ago, normal Cardiologost: None PCP: Renne Crigler  Past Medical History  Diagnosis Date  . DVT (deep venous thrombosis)   . Asthma   . Arthritis   . Stroke   . Hypertension     Past Surgical History  Procedure Date  . Abdominal hysterectomy   . Cholecystectomy   . Knee surgery   . Endovascular stent insertion     History reviewed. No pertinent family history.  History  Substance Use Topics  . Smoking status: Never Smoker   . Smokeless tobacco: Never Used  . Alcohol Use: No    OB History    Grav Para Term Preterm Abortions TAB SAB Ect Mult Living                  Review of Systems  Constitutional: Negative for fever.  Respiratory: Negative for shortness of breath.   Cardiovascular: Positive for chest pain.  Gastrointestinal: Negative for nausea, vomiting, abdominal pain and diarrhea.  All other systems reviewed and are negative.    Allergies  Solarcaine aloe extra; Septra; and Cephalexin  Home Medications   Current Outpatient Rx  Name Route Sig Dispense Refill  . ALBUTEROL SULFATE HFA 108 (90 BASE) MCG/ACT IN AERS Inhalation Inhale 2 puffs into the  lungs every 6 (six) hours as needed.      . ASPIRIN 81 MG PO TABS Oral Take 81 mg by mouth daily.      Marland Kitchen ALIGN PO CAPS Oral Take 1 capsule by mouth daily.    Marland Kitchen VITAMIN D 2000 UNITS PO TABS Oral Take 2,000 Units by mouth daily.    Marland Kitchen CLOPIDOGREL BISULFATE 75 MG PO TABS Oral Take 75 mg by mouth daily.      . CYCLOBENZAPRINE HCL 7.5 MG PO TABS Oral Take 7.5 mg by mouth 3 (three) times daily as needed.      Marland Kitchen DILTIAZEM HCL ER COATED BEADS 240 MG PO CP24 Oral Take 240 mg by mouth daily.      Marland Kitchen ESOMEPRAZOLE MAGNESIUM 40 MG PO CPDR Oral Take 40 mg by mouth daily before breakfast.      . FLUTICASONE-SALMETEROL 100-50 MCG/DOSE IN AEPB Inhalation Inhale 1 puff into the lungs every 12 (twelve) hours.      Marland Kitchen LOSARTAN POTASSIUM-HCTZ 100-25 MG PO TABS Oral Take 1 tablet by mouth daily.      . MELOXICAM 7.5 MG PO TABS Oral Take 7.5 mg by mouth daily.    Marland Kitchen TIOTROPIUM BROMIDE MONOHYDRATE 18 MCG IN CAPS Inhalation Place 18 mcg into inhaler and inhale daily.      Marland Kitchen HYDROCODONE-ACETAMINOPHEN 5-500 MG PO TABS Oral Take 1 tablet by mouth every 6 (six) hours as  needed. For pain      BP 155/76  Pulse 90  Temp 98.8 F (37.1 C) (Oral)  Resp 18  Ht 5' 1.5" (1.562 m)  Wt 177 lb (80.287 kg)  BMI 32.90 kg/m2  SpO2 97%  Physical Exam  Nursing note and vitals reviewed. Constitutional: She is oriented to person, place, and time. She appears well-developed and well-nourished. No distress.  HENT:  Head: Normocephalic and atraumatic.  Right Ear: External ear normal.  Mouth/Throat: Oropharynx is clear and moist.  Eyes: Conjunctivae normal and EOM are normal. Pupils are equal, round, and reactive to light.  Neck: Normal range of motion. No JVD present.  Cardiovascular: Normal rate.   Pulmonary/Chest: Effort normal and breath sounds normal. No stridor. No respiratory distress. She has no wheezes. She has no rales. She exhibits no tenderness.  Abdominal: Soft. Bowel sounds are normal. She exhibits no distension and no  mass. There is no tenderness. There is no rebound and no guarding.  Musculoskeletal: Normal range of motion. She exhibits no edema and no tenderness.  Neurological: She is alert and oriented to person, place, and time.  Skin: Skin is warm.  Psychiatric: She has a normal mood and affect.    ED Course  Procedures (including critical care time)  Labs Reviewed  CBC WITH DIFFERENTIAL - Abnormal; Notable for the following:    RBC 3.78 (*)     Hemoglobin 10.6 (*)     HCT 32.5 (*)     All other components within normal limits  BASIC METABOLIC PANEL - Abnormal; Notable for the following:    BUN 26 (*)     Creatinine, Ser 1.11 (*)     GFR calc non Af Amer 46 (*)     GFR calc Af Amer 53 (*)     All other components within normal limits  D-DIMER, QUANTITATIVE - Abnormal; Notable for the following:    D-Dimer, Quant 0.61 (*)     All other components within normal limits  PROTIME-INR  POCT I-STAT TROPONIN I  CBC  COMPREHENSIVE METABOLIC PANEL  MAGNESIUM   Dg Chest 2 View  03/06/2012  *RADIOLOGY REPORT*  Clinical Data: Chest pain, hypertension.  CHEST - 2 VIEW  Comparison: 10/31/2009  Findings: Small to moderate hiatal hernia.  Bibasilar scarring. Eventration of the right hemidiaphragm.  Findings are stable.  No acute opacities or effusions.  No acute bony abnormality.  Heart is normal size.  IMPRESSION: Chronic changes.  No active disease.   Original Report Authenticated By: Cyndie Chime, M.D.     Date: 03/06/2012  Rate: 98  Rhythm: normal sinus rhythm  QRS Axis: normal  Intervals: normal  ST/T Wave abnormalities: normal  Conduction Disutrbances:none  Narrative Interpretation:   Old EKG Reviewed: unchanged   1. Chest pain       MDM  78 y.o. female in no acute distress complaining of burning sensation to chest intermittently for 48 hours associated with productive cough. Patient's EKG is nonischemic and troponin is negative. Her chest x-ray shows no infiltrate. Comfortable at  this time and refuses any pain medication.  Creatinine is mildly elevated at 1.11 which is not atypical for her.   D-dimer is elevated above the upper limit of normal at 0.61, however considering her age it is not this is a true elevation warrants a CTA at this time. This was discussed with attending Dr. Ignacia Palma who agrees with assessment and decision to not scan her at this point.  Her chest pain is  atypical and resolved at this point. Considering her age and risk factors I think it is reasonable to expedite a cardiology evaluation by putting her in the chest pain protocol. However patient adamantly refuses this I have spent several hours revisiting this option with her and she remains firm and her decision not to stay but she wants to go home. She does have good outpatient followup care with primary care by Dr. Melina Modena. She has assured me she will follow with Dr. Judithann Graves in the next 24 hours I have advised her to call 911 or return to the emergency room immediately for any worsening of symptoms or shortness of breath, nausea vomiting. Patient has assured me she will not hesitate to call 911 or return to the emergency room if her symptoms change.   Pt verbalized understanding and agrees with care plan. Outpatient follow-up and return precautions given.           Wynetta Emery, PA-C 03/06/12 1623

## 2012-03-06 NOTE — ED Notes (Signed)
Called lab, the plan is to pull blue lab specimen from a.m. And add d-dimer to it.

## 2012-03-06 NOTE — ED Provider Notes (Signed)
Medical screening examination/treatment/procedure(s) were performed by non-physician practitioner and as supervising physician I was immediately available for consultation/collaboration.   Carleene Cooper III, MD 03/06/12 [phone]

## 2012-03-10 ENCOUNTER — Encounter: Payer: Self-pay | Admitting: Cardiovascular Disease

## 2012-03-10 ENCOUNTER — Ambulatory Visit (INDEPENDENT_AMBULATORY_CARE_PROVIDER_SITE_OTHER): Payer: Medicare Other | Admitting: Cardiovascular Disease

## 2012-03-10 VITALS — BP 148/86 | HR 93 | Ht 61.5 in | Wt 175.8 lb

## 2012-03-10 DIAGNOSIS — R079 Chest pain, unspecified: Secondary | ICD-10-CM

## 2012-03-10 DIAGNOSIS — R0789 Other chest pain: Secondary | ICD-10-CM

## 2012-03-10 NOTE — Patient Instructions (Signed)
1) Your physician has requested that you have a lexiscan myoview. Please follow instruction sheet, as given.  2) Your physician recommends that you schedule a follow-up appointment in: AS NEEDED BASIS

## 2012-03-10 NOTE — Progress Notes (Signed)
Natalie Kent Date of Birth  [birth year]       [facility] Office [address]  [address], [address] [place], [place]  [address]   [place], [place]  [address] [phone]     [phone]   Fax  [phone]    Fax [phone]  Problem List: 1. Chest pain 2. Hiatal hernia 3. Hypertension 4. Asthma 5. Fibromyalgia 6. PVD - s/p CVA, s/p cerebral  stenting    History of Present Illness:  Natalie Kent is a 78 yo with a hx of CP.  She recently was seen in the ER for CP.  Work up was negative.  She awoke with chest heaviness on Oct. 10 .  The pain lasted all day long.  She has felt well since that time and has not had any chest pain since that time.  She does not get any exercise.  She does have leg weakness with housework. No CP or dyspnea.  She has had asthma in the past but it is well controled at this point.   Current Outpatient Prescriptions on File Prior to Visit  Medication Sig Dispense Refill  . albuterol (PROVENTIL HFA;VENTOLIN HFA) 108 (90 BASE) MCG/ACT inhaler Inhale 2 puffs into the lungs every 6 (six) hours as needed.        Marland Kitchen aspirin 81 MG tablet Take 81 mg by mouth daily.        . bifidobacterium infantis (ALIGN) capsule Take 1 capsule by mouth daily.      . Cholecalciferol (VITAMIN D) 2000 UNITS tablet Take 2,000 Units by mouth daily.      . clopidogrel (PLAVIX) 75 MG tablet Take 75 mg by mouth daily.        . cyclobenzaprine (FEXMID) 7.5 MG tablet Take 7.5 mg by mouth 3 (three) times daily as needed.        . diltiazem (CARDIZEM CD) 240 MG 24 hr capsule Take 240 mg by mouth daily.        Marland Kitchen esomeprazole (NEXIUM) 40 MG capsule Take 40 mg by mouth daily before breakfast.        . Fluticasone-Salmeterol (ADVAIR) 100-50 MCG/DOSE AEPB Inhale 1 puff into the lungs every 12 (twelve) hours.        Marland Kitchen HYDROcodone-acetaminophen (VICODIN) 5-500 MG per tablet Take 1 tablet by mouth every 6 (six) hours as needed. For pain      .  losartan-hydrochlorothiazide (HYZAAR) 100-25 MG per tablet Take 1 tablet by mouth daily.        . meloxicam (MOBIC) 7.5 MG tablet Take 7.5 mg by mouth daily.      Marland Kitchen tiotropium (SPIRIVA) 18 MCG inhalation capsule Place 18 mcg into inhaler and inhale daily.          Allergies  Allergen Reactions  . Solarcaine Aloe Extra (Lidocaine Hcl)   . Septra (Bactrim)   . Cephalexin Rash    Past Medical History  Diagnosis Date  . DVT (deep venous thrombosis)   . Asthma   . Arthritis   . Stroke   . Hypertension     Past Surgical History  Procedure Date  . Abdominal hysterectomy   . Cholecystectomy   . Knee surgery   . Endovascular stent insertion     History  Smoking status  . Never Smoker   Smokeless tobacco  . Never Used    History  Alcohol Use No    No family history on file.  Reviw of Systems:  Reviewed in the HPI.  All other systems are negative.  Physical Exam: Blood pressure 148/86, pulse 93, height 5' 1.5" (1.562 m), weight 175 lb 12.8 oz (79.742 kg), SpO2 96.00%. General: Well developed, well nourished, in no acute distress.  Head: Normocephalic, atraumatic, sclera non-icteric, mucus membranes are moist,   Neck: Supple. Carotids are 2 + without bruits. No JVD  Lungs: Clear bilaterally to auscultation.  Heart: regular rate.  normal  S1 S2. She has a very soft systolic murmur.  Abdomen: Soft, non-tender, non-distended with normal bowel sounds. No hepatomegaly. No rebound/guarding. No masses.  Msk:  Strength and tone are normal  Extremities: No clubbing or cyanosis. No edema.  Distal pedal pulses are 2+ and equal bilaterally.  Neuro: Alert and oriented X 3. Moves all extremities spontaneously.  Psych:  Responds to questions appropriately with a normal affect.  ECG: Oct. 10, NSR. Poor R wave progression - possibly lead placement.  No ST or T wave changes.  Assessment / Plan:

## 2012-03-10 NOTE — Assessment & Plan Note (Signed)
Mrs. Natalie Kent is a 78 year old female who was recently seen in the emergency department with prolonged episodes of chest tightness. She did not want to stay for observation. She has not had any further episodes of chest tightness since that time. She suspects that it may be due to her hiatal hernia.  She does have an abnormal EKG with poor R wave progression and I think it is reasonable to perform a stress Myoview study for further evaluation.  I  will see her on an as needed basis unless the myoivew is abnormal.

## 2012-03-18 ENCOUNTER — Ambulatory Visit (HOSPITAL_COMMUNITY): Payer: Medicare Other | Attending: Cardiovascular Disease | Admitting: Radiology

## 2012-03-18 VITALS — BP 141/73 | HR 85 | Ht 61.5 in | Wt 175.0 lb

## 2012-03-18 DIAGNOSIS — R42 Dizziness and giddiness: Secondary | ICD-10-CM | POA: Insufficient documentation

## 2012-03-18 DIAGNOSIS — R9431 Abnormal electrocardiogram [ECG] [EKG]: Secondary | ICD-10-CM | POA: Insufficient documentation

## 2012-03-18 DIAGNOSIS — I10 Essential (primary) hypertension: Secondary | ICD-10-CM | POA: Insufficient documentation

## 2012-03-18 DIAGNOSIS — Z8673 Personal history of transient ischemic attack (TIA), and cerebral infarction without residual deficits: Secondary | ICD-10-CM | POA: Insufficient documentation

## 2012-03-18 DIAGNOSIS — R0789 Other chest pain: Secondary | ICD-10-CM | POA: Insufficient documentation

## 2012-03-18 DIAGNOSIS — R079 Chest pain, unspecified: Secondary | ICD-10-CM

## 2012-03-18 DIAGNOSIS — I739 Peripheral vascular disease, unspecified: Secondary | ICD-10-CM | POA: Insufficient documentation

## 2012-03-18 MED ORDER — TECHNETIUM TC 99M SESTAMIBI GENERIC - CARDIOLITE
11.0000 | Freq: Once | INTRAVENOUS | Status: AC | PRN
  Administered 2012-03-18: 11 via INTRAVENOUS

## 2012-03-18 MED ORDER — REGADENOSON 0.4 MG/5ML IV SOLN
0.4000 mg | Freq: Once | INTRAVENOUS | Status: AC
  Administered 2012-03-18: 0.4 mg via INTRAVENOUS

## 2012-03-18 MED ORDER — TECHNETIUM TC 99M SESTAMIBI GENERIC - CARDIOLITE
33.0000 | Freq: Once | INTRAVENOUS | Status: AC | PRN
  Administered 2012-03-18: 33 via INTRAVENOUS

## 2012-03-18 NOTE — Progress Notes (Signed)
[facility] SITE 3 NUCLEAR MED [address] [id number] [place] [address] [phone]  Cardiology Nuclear Med Study  SHAWNA WEARING is a 78 y.o. female     MRN : [id number]     DOB: [birth year]  Procedure Date: 03/18/2012  Nuclear Med Background Indication for Stress Test:  Evaluation for Ischemia and Abnormal EKG History:  '05 MPS:No ischemia, EF=75%; '06 Echo:EF=65%; 03/06/12 ED with CP, negative enzymes. Cardiac Risk Factors: CVA, Hypertension and PVD  Symptoms:  Chest Tightness (last episode of chest discomfort none since discharge) and Dizziness   Nuclear Pre-Procedure Caffeine/Decaff Intake:  None NPO After: 8:30am   Lungs:  Minimal expiratory wheezes.  Pro Air used prior to [employer]. O2 Sat: 97% on room air. IV 0.9% NS with Angio Cath:  20g  IV Site: L Antecubital  IV Started by:  Stanton Kidney, EMT-P  Chest Size (in):  48 Cup Size: D  Height: 5' 1.5" (1.562 m)  Weight:  175 lb (79.379 kg)  BMI:  Body mass index is 32.53 kg/(m^2). Tech Comments:  Med's were taken at 8:30 am, per patient.    Nuclear Med Study 1 or 2 day study: 1 day  Stress Test Type:  Eugenie Birks  Reading MD: Cassell Clement, MD  Order Authorizing Provider:  Kristeen Miss, MD  Resting Radionuclide: Technetium 80m Sestamibi  Resting Radionuclide Dose: 10.6 mCi   Stress Radionuclide:  Technetium 93m Sestamibi  Stress Radionuclide Dose: 33.0 mCi           Stress Protocol Rest HR: 85 Stress HR: 99  Rest BP: 141/73 Stress BP: 148/54  Exercise Time (min): n/a METS: n/a   Predicted Max HR: 141 bpm % Max HR: 70.21 bpm Rate Pressure Product: 28413   Dose of Adenosine (mg):  n/a Dose of Lexiscan: 0.4 mg  Dose of Atropine (mg): n/a Dose of Dobutamine: n/a mcg/kg/min (at max HR)  Stress Test Technologist: Smiley Houseman, CMA-N  Nuclear Technologist:  Doyne Keel, CNMT     Rest Procedure:  Myocardial perfusion imaging was performed at rest 45 minutes following the intravenous  administration of Technetium 50m Sestamibi.  Rest ECG: No acute changes, occasional PAC's were noted.  Stress Procedure:  The patient received IV Lexiscan 0.4 mg over 15-seconds.  Technetium 60m Sestamibi was injected at 30-seconds.  There were no significant changes with Lexiscan.  Quantitative spect images were obtained after a 45 minute delay.  Stress ECG: No significant change from baseline ECG  QPS Raw Data Images:  Normal; no motion artifact; normal heart/lung ratio. Stress Images:  Normal homogeneous uptake in all areas of the myocardium. Rest Images:  Normal homogeneous uptake in all areas of the myocardium. Subtraction (SDS):  No evidence of ischemia. Transient Ischemic Dilatation (Normal <1.22):  1.05 Lung/Heart Ratio (Normal <0.45):  0.23  Quantitative Gated Spect Images QGS EDV:  n/a QGS ESV:  n/a  Impression Exercise Capacity:  Lexiscan with no exercise. BP Response:  Normal blood pressure response. Clinical Symptoms:  No significant symptoms noted. ECG Impression:  No significant ST segment change suggestive of ischemia. Comparison with Prior Nuclear Study: No images to compare  Overall Impression:  Normal stress nuclear study. LV systolic function not assessed.  LV Ejection Fraction: Study not gated.  LV Wall Motion:  Study not gated  [employer]

## 2012-04-08 ENCOUNTER — Ambulatory Visit (HOSPITAL_COMMUNITY): Payer: Medicare Other

## 2012-06-29 ENCOUNTER — Emergency Department (HOSPITAL_BASED_OUTPATIENT_CLINIC_OR_DEPARTMENT_OTHER)
Admission: EM | Admit: 2012-06-29 | Discharge: 2012-06-30 | Disposition: A | Discharge status: Home / Self Care | Payer: Medicare Other | Attending: Emergency Medicine | Admitting: Emergency Medicine

## 2012-06-29 ENCOUNTER — Encounter (HOSPITAL_BASED_OUTPATIENT_CLINIC_OR_DEPARTMENT_OTHER): Payer: Self-pay | Admitting: *Deleted

## 2012-06-29 DIAGNOSIS — Z7982 Long term (current) use of aspirin: Secondary | ICD-10-CM | POA: Insufficient documentation

## 2012-06-29 DIAGNOSIS — W010XXA Fall on same level from slipping, tripping and stumbling without subsequent striking against object, initial encounter: Secondary | ICD-10-CM | POA: Insufficient documentation

## 2012-06-29 DIAGNOSIS — Z8673 Personal history of transient ischemic attack (TIA), and cerebral infarction without residual deficits: Secondary | ICD-10-CM | POA: Insufficient documentation

## 2012-06-29 DIAGNOSIS — I4891 Unspecified atrial fibrillation: Secondary | ICD-10-CM | POA: Insufficient documentation

## 2012-06-29 DIAGNOSIS — Y9301 Activity, walking, marching and hiking: Secondary | ICD-10-CM | POA: Insufficient documentation

## 2012-06-29 DIAGNOSIS — Z86718 Personal history of other venous thrombosis and embolism: Secondary | ICD-10-CM | POA: Insufficient documentation

## 2012-06-29 DIAGNOSIS — J45909 Unspecified asthma, uncomplicated: Secondary | ICD-10-CM | POA: Insufficient documentation

## 2012-06-29 DIAGNOSIS — M129 Arthropathy, unspecified: Secondary | ICD-10-CM | POA: Insufficient documentation

## 2012-06-29 DIAGNOSIS — IMO0002 Reserved for concepts with insufficient information to code with codable children: Secondary | ICD-10-CM | POA: Insufficient documentation

## 2012-06-29 DIAGNOSIS — S2239XA Fracture of one rib, unspecified side, initial encounter for closed fracture: Secondary | ICD-10-CM | POA: Insufficient documentation

## 2012-06-29 DIAGNOSIS — I10 Essential (primary) hypertension: Secondary | ICD-10-CM | POA: Insufficient documentation

## 2012-06-29 DIAGNOSIS — Z7901 Long term (current) use of anticoagulants: Secondary | ICD-10-CM | POA: Insufficient documentation

## 2012-06-29 DIAGNOSIS — Y929 Unspecified place or not applicable: Secondary | ICD-10-CM | POA: Insufficient documentation

## 2012-06-29 DIAGNOSIS — Z79899 Other long term (current) drug therapy: Secondary | ICD-10-CM | POA: Insufficient documentation

## 2012-06-29 LAB — CBC WITH DIFFERENTIAL/PLATELET
Basophils Absolute: 0 10*3/uL (ref 0.0–0.1)
Basophils Relative: 0 % (ref 0–1)
Lymphocytes Relative: 31 % (ref 12–46)
MCHC: 32.3 g/dL (ref 30.0–36.0)
Neutro Abs: 4.5 10*3/uL (ref 1.7–7.7)
Neutrophils Relative %: 56 % (ref 43–77)
Platelets: 303 10*3/uL (ref 150–400)
RDW: 13.4 % (ref 11.5–15.5)
WBC: 7.9 10*3/uL (ref 4.0–10.5)

## 2012-06-29 LAB — BASIC METABOLIC PANEL
Calcium: 9.9 mg/dL (ref 8.4–10.5)
Chloride: 102 mEq/L (ref 96–112)
Creatinine, Ser: 1.2 mg/dL — ABNORMAL HIGH (ref 0.50–1.10)
GFR calc Af Amer: 48 mL/min — ABNORMAL LOW (ref >90)
GFR calc non Af Amer: 42 mL/min — ABNORMAL LOW (ref >90)

## 2012-06-29 MED ORDER — OXYCODONE-ACETAMINOPHEN 5-325 MG PO TABS
1.0000 | ORAL_TABLET | Freq: Once | ORAL | Status: AC
  Administered 2012-06-29: 1 via ORAL
  Filled 2012-06-29 (×2): qty 1

## 2012-06-29 NOTE — ED Provider Notes (Addendum)
History  This chart was scribed for Natalie Mayeda Smitty Cords, MD by Erskine Emery, ED Scribe. This patient was seen in room MH10/MH10 and the patient's care was started at 23:00.   CSN: [id number]  Arrival date & time 06/29/12  2236   First MD Initiated Contact with Patient 06/29/12 2300      Chief Complaint  Patient presents with  . Fall    (Consider location/radiation/quality/duration/timing/severity/associated sxs/prior treatment) Patient is a 79 y.o. female presenting with fall. The history is provided by the patient. No language interpreter was used.  Fall The accident occurred more than 2 days ago. The fall occurred while walking. She fell from a height of 1 to 2 ft. She landed on a hard floor. Point of impact: ribs. The pain is severe. She was ambulatory at the scene. There was no drug use involved in the accident. There was no alcohol use involved in the accident. Pertinent negatives include no abdominal pain and no bowel incontinence. The symptoms are aggravated by activity. The treatment provided no relief.  Natalie Kent is a 79 y.o. female who presents to the Emergency Department complaining of rib pain that radiates to the back since she tripped and fell into her recliner and then onto the floor on Friday night. Pt denies any associated hitting her head, LOC, bleeding, or abdominal pain, but she reports some bruising around the right ribs. Pt took oxycodone Friday night for the pain but has been taking tylenol ever since, which provides only mild relief from pain. Pt had cataracts surgery last Thursday (4 days ago). Pt is allergic to Solarcaine Aloe Extra, Septra, and Cephalexin.  Past Medical History  Diagnosis Date  . DVT (deep venous thrombosis)   . Asthma   . Arthritis   . Stroke   . Hypertension   . Atrial fibrillation     Past Surgical History  Procedure Date  . Abdominal hysterectomy   . Cholecystectomy   . Knee surgery   . Endovascular stent insertion   . Eye  surgery     History reviewed. No pertinent family history.  History  Substance Use Topics  . Smoking status: Never Smoker   . Smokeless tobacco: Never Used  . Alcohol Use: No    OB History    Grav Para Term Preterm Abortions TAB SAB Ect Mult Living                  Review of Systems  Cardiovascular: Positive for chest pain.  Gastrointestinal: Negative for abdominal pain and bowel incontinence.  Musculoskeletal: Positive for back pain.  Skin: Negative for wound.  Neurological: Negative for syncope.  All other systems reviewed and are negative.    Allergies  Solarcaine aloe extra; Septra; and Cephalexin  Home Medications   Current Outpatient Rx  Name  Route  Sig  Dispense  Refill  . ALBUTEROL SULFATE HFA 108 (90 BASE) MCG/ACT IN AERS   Inhalation   Inhale 2 puffs into the lungs every 6 (six) hours as needed.           . ASPIRIN 81 MG PO TABS   Oral   Take 81 mg by mouth daily.           Marland Kitchen ALIGN PO CAPS   Oral   Take 1 capsule by mouth daily.         Marland Kitchen VITAMIN D 2000 UNITS PO TABS   Oral   Take 2,000 Units by mouth daily.         Marland Kitchen  CLOPIDOGREL BISULFATE 75 MG PO TABS   Oral   Take 75 mg by mouth daily.           . CYCLOBENZAPRINE HCL 7.5 MG PO TABS   Oral   Take 7.5 mg by mouth 3 (three) times daily as needed.           Marland Kitchen DILTIAZEM HCL ER COATED BEADS 240 MG PO CP24   Oral   Take 240 mg by mouth daily.           Marland Kitchen ESOMEPRAZOLE MAGNESIUM 40 MG PO CPDR   Oral   Take 40 mg by mouth daily before breakfast.           . FLUTICASONE-SALMETEROL 100-50 MCG/DOSE IN AEPB   Inhalation   Inhale 1 puff into the lungs every 12 (twelve) hours.           Marland Kitchen HYDROCODONE-ACETAMINOPHEN 5-500 MG PO TABS   Oral   Take 1 tablet by mouth every 6 (six) hours as needed. For pain         . LOSARTAN POTASSIUM-HCTZ 100-25 MG PO TABS   Oral   Take 1 tablet by mouth daily.           . MELOXICAM 7.5 MG PO TABS   Oral   Take 7.5 mg by mouth daily.          Marland Kitchen TIOTROPIUM BROMIDE MONOHYDRATE 18 MCG IN CAPS   Inhalation   Place 18 mcg into inhaler and inhale daily.             Triage Vitals: BP 146/63  Pulse 80  Temp 98.3 F (36.8 C) (Oral)  Resp 20  Ht 5\' 3"  (1.6 m)  Wt 179 lb (81.194 kg)  BMI 31.71 kg/m2  SpO2 96%  Physical Exam  Nursing note and vitals reviewed. Constitutional: She is oriented to person, place, and time. She appears well-developed and well-nourished. No distress.  HENT:  Head: Normocephalic and atraumatic.  Right Ear: External ear normal.  Left Ear: External ear normal.  Mouth/Throat: Oropharynx is clear and moist.       No hemotympanum on the right or left.   Eyes: EOM are normal. Pupils are equal, round, and reactive to light.  Neck: Neck supple. No tracheal deviation present.  Cardiovascular: Normal rate, regular rhythm and normal heart sounds.   Pulmonary/Chest: Effort normal. No respiratory distress.  Abdominal: Soft. Bowel sounds are normal. She exhibits no distension. There is no tenderness.  Musculoskeletal: Normal range of motion. She exhibits no edema.       No c-spine, l-spine, or t-spine tenderness. No crepitous or step offs. DTRs intact, normal.   Neurological: She is alert and oriented to person, place, and time.  Skin: Skin is warm and dry.       Right flank bruising that extends from the thoracic cavity to the abdominal cavity, that is already starting to heal.  Psychiatric: She has a normal mood and affect.    ED Course  Procedures (including critical care time) ,DIAGNOSTIC STUDIES: Oxygen Saturation is 96% on room air, adeqauate by my interpretation.    COORDINATION OF CARE: 234:05--I evaluated the patient and we discussed a treatment plan including oxycodone, ibuprofen, blood work, and CT scans to which the pt agreed.    Labs Reviewed - No data to display No results found.   No diagnosis found.    MDM  I personally performed the services described in this  documentation, which was scribed in  my presence. The recorded information has been reviewed and is accurate.    Jasmine Awe, MD 07/04/12 0102  Sorin Frimpong K Honestie Kulik-Rasch, MD 07/04/12 [phone]

## 2012-06-29 NOTE — ED Notes (Signed)
Patient was unable to sit or get onto stretcher bed without pain. I helped patient back into wheelchair where she stated is most comfortable.

## 2012-06-29 NOTE — ED Notes (Signed)
Right rib pain-fell Friday. Burning in chest last night.

## 2012-06-30 ENCOUNTER — Emergency Department (HOSPITAL_BASED_OUTPATIENT_CLINIC_OR_DEPARTMENT_OTHER): Payer: Medicare Other

## 2012-06-30 ENCOUNTER — Other Ambulatory Visit (HOSPITAL_COMMUNITY): Payer: Self-pay | Admitting: Interventional Radiology

## 2012-06-30 ENCOUNTER — Encounter (HOSPITAL_BASED_OUTPATIENT_CLINIC_OR_DEPARTMENT_OTHER): Payer: Self-pay

## 2012-06-30 DIAGNOSIS — I6609 Occlusion and stenosis of unspecified middle cerebral artery: Secondary | ICD-10-CM

## 2012-06-30 MED ORDER — MORPHINE SULFATE 2 MG/ML IJ SOLN
INTRAMUSCULAR | Status: AC
  Administered 2012-06-30: 2 mg via INTRAVENOUS
  Filled 2012-06-30: qty 1

## 2012-06-30 MED ORDER — FENTANYL CITRATE 0.05 MG/ML IJ SOLN
INTRAMUSCULAR | Status: AC
  Administered 2012-06-30: 50 ug
  Filled 2012-06-30: qty 2

## 2012-06-30 MED ORDER — OXYCODONE-ACETAMINOPHEN 5-325 MG PO TABS: 1.0000 | ORAL_TABLET | Freq: Four times a day (QID) | ORAL | Status: DC | PRN

## 2012-06-30 MED ORDER — IOHEXOL 300 MG/ML  SOLN
80.0000 mL | Freq: Once | INTRAMUSCULAR | Status: AC | PRN
  Administered 2012-06-30: 80 mL via INTRAVENOUS

## 2012-07-04 ENCOUNTER — Encounter (HOSPITAL_BASED_OUTPATIENT_CLINIC_OR_DEPARTMENT_OTHER): Payer: Self-pay | Admitting: *Deleted

## 2012-07-04 ENCOUNTER — Emergency Department (HOSPITAL_BASED_OUTPATIENT_CLINIC_OR_DEPARTMENT_OTHER): Payer: Medicare Other

## 2012-07-04 ENCOUNTER — Emergency Department (HOSPITAL_COMMUNITY): Payer: Medicare Other

## 2012-07-04 ENCOUNTER — Emergency Department (HOSPITAL_BASED_OUTPATIENT_CLINIC_OR_DEPARTMENT_OTHER)
Admission: EM | Admit: 2012-07-04 | Discharge: 2012-07-04 | Disposition: A | Discharge status: Home / Self Care | Payer: Medicare Other | Attending: Emergency Medicine | Admitting: Emergency Medicine

## 2012-07-04 DIAGNOSIS — IMO0002 Reserved for concepts with insufficient information to code with codable children: Secondary | ICD-10-CM | POA: Insufficient documentation

## 2012-07-04 DIAGNOSIS — J45909 Unspecified asthma, uncomplicated: Secondary | ICD-10-CM | POA: Insufficient documentation

## 2012-07-04 DIAGNOSIS — I10 Essential (primary) hypertension: Secondary | ICD-10-CM | POA: Insufficient documentation

## 2012-07-04 DIAGNOSIS — Z79899 Other long term (current) drug therapy: Secondary | ICD-10-CM | POA: Insufficient documentation

## 2012-07-04 DIAGNOSIS — Z86718 Personal history of other venous thrombosis and embolism: Secondary | ICD-10-CM | POA: Insufficient documentation

## 2012-07-04 DIAGNOSIS — Z7901 Long term (current) use of anticoagulants: Secondary | ICD-10-CM | POA: Insufficient documentation

## 2012-07-04 DIAGNOSIS — K560 Paralytic ileus: Secondary | ICD-10-CM | POA: Insufficient documentation

## 2012-07-04 DIAGNOSIS — N390 Urinary tract infection, site not specified: Secondary | ICD-10-CM | POA: Insufficient documentation

## 2012-07-04 DIAGNOSIS — Z90710 Acquired absence of both cervix and uterus: Secondary | ICD-10-CM | POA: Insufficient documentation

## 2012-07-04 DIAGNOSIS — R071 Chest pain on breathing: Secondary | ICD-10-CM | POA: Insufficient documentation

## 2012-07-04 DIAGNOSIS — Z8679 Personal history of other diseases of the circulatory system: Secondary | ICD-10-CM | POA: Insufficient documentation

## 2012-07-04 DIAGNOSIS — M129 Arthropathy, unspecified: Secondary | ICD-10-CM | POA: Insufficient documentation

## 2012-07-04 DIAGNOSIS — Z8781 Personal history of (healed) traumatic fracture: Secondary | ICD-10-CM | POA: Insufficient documentation

## 2012-07-04 DIAGNOSIS — Z7982 Long term (current) use of aspirin: Secondary | ICD-10-CM | POA: Insufficient documentation

## 2012-07-04 DIAGNOSIS — Z9089 Acquired absence of other organs: Secondary | ICD-10-CM | POA: Insufficient documentation

## 2012-07-04 DIAGNOSIS — Z8673 Personal history of transient ischemic attack (TIA), and cerebral infarction without residual deficits: Secondary | ICD-10-CM | POA: Insufficient documentation

## 2012-07-04 DIAGNOSIS — K567 Ileus, unspecified: Secondary | ICD-10-CM

## 2012-07-04 DIAGNOSIS — K5900 Constipation, unspecified: Secondary | ICD-10-CM | POA: Insufficient documentation

## 2012-07-04 HISTORY — DX: Fracture of one rib, right side, initial encounter for closed fracture: S22.31XA

## 2012-07-04 LAB — COMPREHENSIVE METABOLIC PANEL
ALT: 9 U/L (ref 0–35)
AST: 10 U/L (ref 0–37)
Alkaline Phosphatase: 93 U/L (ref 39–117)
Calcium: 10.6 mg/dL — ABNORMAL HIGH (ref 8.4–10.5)
GFR calc Af Amer: 54 mL/min — ABNORMAL LOW (ref >90)
Glucose, Bld: 124 mg/dL — ABNORMAL HIGH (ref 70–99)
Potassium: 3.9 mEq/L (ref 3.5–5.1)
Sodium: 137 mEq/L (ref 135–145)
Total Protein: 6.5 g/dL (ref 6.0–8.3)

## 2012-07-04 LAB — CBC
Hemoglobin: 11.2 g/dL — ABNORMAL LOW (ref 12.0–15.0)
MCH: 28.2 pg (ref 26.0–34.0)
MCHC: 32.5 g/dL (ref 30.0–36.0)
Platelets: 347 10*3/uL (ref 150–400)
RBC: 3.97 MIL/uL (ref 3.87–5.11)

## 2012-07-04 LAB — URINE MICROSCOPIC-ADD ON

## 2012-07-04 LAB — URINALYSIS, ROUTINE W REFLEX MICROSCOPIC
Nitrite: NEGATIVE
Specific Gravity, Urine: 1.022 (ref 1.005–1.030)
Urobilinogen, UA: 1 mg/dL (ref 0.0–1.0)
pH: 5.5 (ref 5.0–8.0)

## 2012-07-04 MED ORDER — MORPHINE SULFATE 4 MG/ML IJ SOLN
4.0000 mg | Freq: Once | INTRAMUSCULAR | Status: AC
  Administered 2012-07-04: 4 mg via INTRAVENOUS
  Filled 2012-07-04: qty 1

## 2012-07-04 MED ORDER — OXYCODONE-ACETAMINOPHEN 5-325 MG PO TABS
1.0000 | ORAL_TABLET | Freq: Once | ORAL | Status: AC
  Administered 2012-07-04: 1 via ORAL
  Filled 2012-07-04 (×2): qty 1

## 2012-07-04 MED ORDER — ONDANSETRON 4 MG PO TBDP
4.0000 mg | ORAL_TABLET | Freq: Once | ORAL | Status: AC
  Administered 2012-07-04: 4 mg via ORAL
  Filled 2012-07-04: qty 1

## 2012-07-04 MED ORDER — IOHEXOL 300 MG/ML  SOLN
50.0000 mL | Freq: Once | INTRAMUSCULAR | Status: AC | PRN
  Administered 2012-07-04: 50 mL via ORAL

## 2012-07-04 MED ORDER — LIP MEDEX EX OINT
TOPICAL_OINTMENT | CUTANEOUS | Status: AC
  Administered 2012-07-04: 08:00:00
  Filled 2012-07-04: qty 7

## 2012-07-04 MED ORDER — CIPROFLOXACIN HCL 500 MG PO TABS: 500.0000 mg | ORAL_TABLET | Freq: Two times a day (BID) | ORAL | Status: DC

## 2012-07-04 MED ORDER — DOCUSATE SODIUM 100 MG PO CAPS: 100.0000 mg | ORAL_CAPSULE | Freq: Two times a day (BID) | ORAL | Status: DC

## 2012-07-04 MED ORDER — IOHEXOL 300 MG/ML  SOLN
100.0000 mL | Freq: Once | INTRAMUSCULAR | Status: AC | PRN
  Administered 2012-07-04: 100 mL via INTRAVENOUS

## 2012-07-04 MED ORDER — FLEET ENEMA 7-19 GM/118ML RE ENEM
1.0000 | ENEMA | Freq: Once | RECTAL | Status: AC
  Administered 2012-07-04: 1 via RECTAL
  Filled 2012-07-04 (×2): qty 1

## 2012-07-04 NOTE — ED Notes (Signed)
Pt given meds with juice and crackers per vorb from [facility]

## 2012-07-04 NOTE — ED Notes (Signed)
Patient transported to X-ray 

## 2012-07-04 NOTE — ED Notes (Signed)
Pt seen here Sunday after fall Friday night and dx with right rib fractures- today c/o lower abd pain x 1 day- last BM 3 days ago- has been taking oxycodone

## 2012-07-04 NOTE — ED Provider Notes (Signed)
History     CSN: [id number]  Arrival date & time 07/04/12  0712   First MD Initiated Contact with Patient 07/04/12 [phone]      Chief Complaint  Patient presents with  . Abdominal Pain    (Consider location/radiation/quality/duration/timing/severity/associated sxs/prior treatment) HPI Pt presents with c/o pain in right ribs- was seen on 2/2 and diagnosed with rib fractures.  She has been taking percocet for pain which is helping.  No fever, no worsening of her baseline cough. This morning she has developed lower abdominal pain- no BM in 3 days.  Has been taking miralax.  No dysuria.  No vomiting.  Movement and palpation makes the pain worse.  There are no other associated systemic symptoms, there are no other alleviating or modifying factors.   Past Medical History  Diagnosis Date  . DVT (deep venous thrombosis)   . Asthma   . Arthritis   . Stroke   . Hypertension   . Atrial fibrillation   . Right rib fracture     Past Surgical History  Procedure Date  . Abdominal hysterectomy   . Cholecystectomy   . Knee surgery   . Endovascular stent insertion   . Eye surgery     No family history on file.  History  Substance Use Topics  . Smoking status: Never Smoker   . Smokeless tobacco: Never Used  . Alcohol Use: No    OB History    Grav Para Term Preterm Abortions TAB SAB Ect Mult Living                  Review of Systems ROS reviewed and all otherwise negative except for mentioned in HPI  Allergies  Solarcaine aloe extra; Septra; and Cephalexin  Home Medications   Current Outpatient Rx  Name  Route  Sig  Dispense  Refill  . ALBUTEROL SULFATE HFA 108 (90 BASE) MCG/ACT IN AERS   Inhalation   Inhale 2 puffs into the lungs every 6 (six) hours as needed.           . ASPIRIN 81 MG PO TABS   Oral   Take 81 mg by mouth daily.           Marland Kitchen VITAMIN D 2000 UNITS PO TABS   Oral   Take 2,000 Units by mouth daily.         Marland Kitchen CLOPIDOGREL BISULFATE 75 MG PO TABS  Oral   Take 75 mg by mouth daily.           . CYCLOBENZAPRINE HCL 7.5 MG PO TABS   Oral   Take 7.5 mg by mouth 3 (three) times daily as needed.           Marland Kitchen FLUTICASONE-SALMETEROL 100-50 MCG/DOSE IN AEPB   Inhalation   Inhale 1 puff into the lungs every 12 (twelve) hours.           Marland Kitchen LOSARTAN POTASSIUM-HCTZ 100-25 MG PO TABS   Oral   Take 1 tablet by mouth daily.           . MELOXICAM 7.5 MG PO TABS   Oral   Take 7.5 mg by mouth daily.         . OXYCODONE-ACETAMINOPHEN 5-325 MG PO TABS   Oral   Take 1 tablet by mouth every 6 (six) hours as needed for pain.   10 tablet   0   . PANTOPRAZOLE SODIUM 40 MG PO TBEC   Oral   Take 40 mg  by mouth daily.         Marland Kitchen POLYETHYLENE GLYCOL 3350 PO PACK   Oral   Take 17 g by mouth daily.         Marland Kitchen TIOTROPIUM BROMIDE MONOHYDRATE 18 MCG IN CAPS   Inhalation   Place 18 mcg into inhaler and inhale daily.           Marland Kitchen ALIGN PO CAPS   Oral   Take 1 capsule by mouth daily.         Marland Kitchen CIPROFLOXACIN HCL 500 MG PO TABS   Oral   Take 1 tablet (500 mg total) by mouth every 12 (twelve) hours.   14 tablet   0   . DILTIAZEM HCL ER COATED BEADS 240 MG PO CP24   Oral   Take 240 mg by mouth daily.           Marland Kitchen DOCUSATE SODIUM 100 MG PO CAPS   Oral   Take 1 capsule (100 mg total) by mouth every 12 (twelve) hours.   60 capsule   0   . ESOMEPRAZOLE MAGNESIUM 40 MG PO CPDR   Oral   Take 40 mg by mouth daily before breakfast.           . HYDROCODONE-ACETAMINOPHEN 5-500 MG PO TABS   Oral   Take 1 tablet by mouth every 6 (six) hours as needed. For pain           BP 129/71  Pulse 102  Temp 99.2 F (37.3 C) (Oral)  Resp 20  SpO2 93% Vitals reviewed Physical Exam Physical Examination: General appearance - alert, well appearing, and in no distress Mental status - alert, oriented to person, place, and time Eyes - no scleral icterus, no conjunctival injection Mouth - mucous membranes moist, pharynx normal without  lesions Chest - clear to auscultation, no wheezes, rales or rhonchi, symmetric air entry, ttp over right chest wall, no crepitus Heart - normal rate, regular rhythm, normal S1, S2, no murmurs, rubs, clicks or gallops Abdomen - soft, tender to palpation in lower abdomen, no gaurding or rebound, nondistended, no masses or organomegaly Extremities - peripheral pulses normal, no pedal edema, no clubbing or cyanosis Skin - normal coloration and turgor, no rashes  ED Course  Procedures (including critical care time)  2:40 PM pt has had some stool output after enema here in the ED.  Her abdomen is less tender and she feels much improved.    Labs Reviewed  URINALYSIS, ROUTINE W REFLEX MICROSCOPIC - Abnormal; Notable for the following:    APPearance CLOUDY (*)     Leukocytes, UA SMALL (*)     All other components within normal limits  CBC - Abnormal; Notable for the following:    WBC 12.2 (*)     Hemoglobin 11.2 (*)     HCT 34.5 (*)     All other components within normal limits  COMPREHENSIVE METABOLIC PANEL - Abnormal; Notable for the following:    Glucose, Bld 124 (*)     BUN 27 (*)     Calcium 10.6 (*)     Albumin 3.2 (*)     Total Bilirubin 0.2 (*)     GFR calc non Af Amer 46 (*)     GFR calc Af Amer 54 (*)     All other components within normal limits  URINE MICROSCOPIC-ADD ON - Abnormal; Notable for the following:    Squamous Epithelial / LPF FEW (*)  Bacteria, UA MANY (*)     All other components within normal limits  LIPASE, BLOOD  URINE CULTURE   Ct Abdomen Pelvis W Contrast  07/04/2012  *RADIOLOGY REPORT*  Clinical Data: Abdominal pain.  The patient fell 1 week ago and fractured the right fifth rib.  CT ABDOMEN AND PELVIS WITH CONTRAST  Technique:  Multidetector CT imaging of the abdomen and pelvis was performed following the standard protocol during bolus administration of intravenous contrast.  Contrast: OMNIPAQUE IOHEXOL 300 MG/ML  SOLN  Comparison: CT scan dated  06/30/2012 and radiographs dated 02/07 2014 grams  Findings: There are subtle fractures of the anterior aspects of the right fifth, sixth, seventh, eighth ribs.  Only the fifth and sixth ribs demonstrate minimal displacement.  The liver, spleen, pancreas, adrenal glands, and kidneys are normal.  Large hiatal hernia.  The patient has developed diffuse slight gaseous distention of the colon with no obstruction.  This is probably an ileus, possibly secondary to pain medication.  Severe arthritis of the right hip.  IMPRESSION:  1. Probable colonic ileus. 2. No other acute intra-abdominal abnormality. 3.  Multiple subtle acute right anterior rib fractures.   Original Report Authenticated By: Francene Boyers, M.D.    Dg Abd Acute W/chest  07/04/2012  *RADIOLOGY REPORT*  Clinical Data: 79 year old female with abdominal pain nausea constipation Recent diagnosis of right rib fracture.  ACUTE ABDOMEN SERIES (ABDOMEN 2 VIEW & CHEST 1 VIEW)  Comparison: CT chest abdomen and pelvis 06/30/2012 and earlier.  Findings: Right fifth rib fracture better demonstrated on recent comparison.  Overall stable lung volumes.  Stable cardiac size and mediastinal contours.  Epicardial fat and hiatal hernia.  Increased linear bibasilar atelectasis.  No pneumothorax or pneumoperitoneum. No definite pleural effusion.  The colon is now distended with gas diffusely to the level of the mid sigmoid.  The colon is redundant.  Retained stool is noted.  No dilated small bowel loops.  There is some gas and stool evident in the rectum.  Stable right upper quadrant surgical clips.  Stable lumbar spine and pelvic osseous structures.  IMPRESSION: 1.  New diffuse gaseous distention of the colon suggestive of ileus.  No dilated small bowel or free air. 2.  Increased bibasilar atelectasis.  Right fifth rib fracture better demonstrated on comparison.  Otherwise stable chest.   Original Report Authenticated By: Erskine Speed, M.D.      1. Ileus   2.  Constipation   3. Urinary tract infection       MDM  Pt presenting with abdominal pain and no BM for several days in the setting of rib fractures and taking pain medications.  Pt has been taking miralax.  Acute abd series shows colonic dilation, CT scan obtained and c/w colonic ileus.  Pt received enema in the ED with good results.  She feels improve, no abdominal tenderness on repeat exam.  Will add colace to her stool regimen, to continue miralax daily.  Continue incentive spirometry and close f/u with PMD.          Ethelda Chick, MD 07/04/12 1534

## 2012-07-04 NOTE — ED Notes (Signed)
MD at bedside. 

## 2012-07-04 NOTE — ED Notes (Signed)
Patient transported to CT 

## 2012-07-05 LAB — URINE CULTURE

## 2012-07-25 ENCOUNTER — Ambulatory Visit (HOSPITAL_COMMUNITY): Admission: RE | Admit: 2012-07-25 | Payer: Medicare Other | Source: Ambulatory Visit

## 2012-08-01 ENCOUNTER — Telehealth (HOSPITAL_COMMUNITY): Payer: Self-pay | Admitting: Interventional Radiology

## 2012-08-04 ENCOUNTER — Ambulatory Visit (HOSPITAL_COMMUNITY): Admission: RE | Admit: 2012-08-04 | Payer: Medicare Other | Source: Ambulatory Visit

## 2012-08-18 ENCOUNTER — Ambulatory Visit (HOSPITAL_COMMUNITY)
Admission: RE | Admit: 2012-08-18 | Discharge: 2012-08-18 | Disposition: A | Discharge status: Home / Self Care | Payer: Medicare Other | Source: Ambulatory Visit | Attending: Interventional Radiology | Admitting: Interventional Radiology

## 2012-08-18 DIAGNOSIS — I6609 Occlusion and stenosis of unspecified middle cerebral artery: Secondary | ICD-10-CM

## 2012-08-18 DIAGNOSIS — I672 Cerebral atherosclerosis: Secondary | ICD-10-CM | POA: Insufficient documentation

## 2012-08-18 DIAGNOSIS — Z7902 Long term (current) use of antithrombotics/antiplatelets: Secondary | ICD-10-CM | POA: Insufficient documentation

## 2012-08-18 LAB — PLATELET INHIBITION P2Y12: Platelet Function  P2Y12: 133 [PRU] — ABNORMAL LOW (ref 194–418)

## 2012-08-26 ENCOUNTER — Telehealth (HOSPITAL_COMMUNITY): Payer: Self-pay | Admitting: Interventional Radiology

## 2012-09-14 ENCOUNTER — Encounter (HOSPITAL_BASED_OUTPATIENT_CLINIC_OR_DEPARTMENT_OTHER): Payer: Self-pay | Admitting: Emergency Medicine

## 2012-09-14 ENCOUNTER — Emergency Department (HOSPITAL_BASED_OUTPATIENT_CLINIC_OR_DEPARTMENT_OTHER): Payer: Medicare Other

## 2012-09-14 ENCOUNTER — Emergency Department (HOSPITAL_BASED_OUTPATIENT_CLINIC_OR_DEPARTMENT_OTHER)
Admission: EM | Admit: 2012-09-14 | Discharge: 2012-09-14 | Disposition: A | Discharge status: Home / Self Care | Payer: Medicare Other | Attending: Emergency Medicine | Admitting: Emergency Medicine

## 2012-09-14 DIAGNOSIS — M129 Arthropathy, unspecified: Secondary | ICD-10-CM | POA: Insufficient documentation

## 2012-09-14 DIAGNOSIS — Z79899 Other long term (current) drug therapy: Secondary | ICD-10-CM | POA: Insufficient documentation

## 2012-09-14 DIAGNOSIS — Z8781 Personal history of (healed) traumatic fracture: Secondary | ICD-10-CM | POA: Insufficient documentation

## 2012-09-14 DIAGNOSIS — Z8739 Personal history of other diseases of the musculoskeletal system and connective tissue: Secondary | ICD-10-CM | POA: Insufficient documentation

## 2012-09-14 DIAGNOSIS — D649 Anemia, unspecified: Secondary | ICD-10-CM | POA: Insufficient documentation

## 2012-09-14 DIAGNOSIS — I10 Essential (primary) hypertension: Secondary | ICD-10-CM | POA: Insufficient documentation

## 2012-09-14 DIAGNOSIS — J159 Unspecified bacterial pneumonia: Secondary | ICD-10-CM | POA: Insufficient documentation

## 2012-09-14 DIAGNOSIS — Z7982 Long term (current) use of aspirin: Secondary | ICD-10-CM | POA: Insufficient documentation

## 2012-09-14 DIAGNOSIS — IMO0002 Reserved for concepts with insufficient information to code with codable children: Secondary | ICD-10-CM | POA: Insufficient documentation

## 2012-09-14 DIAGNOSIS — Z8673 Personal history of transient ischemic attack (TIA), and cerebral infarction without residual deficits: Secondary | ICD-10-CM | POA: Insufficient documentation

## 2012-09-14 DIAGNOSIS — J189 Pneumonia, unspecified organism: Secondary | ICD-10-CM

## 2012-09-14 DIAGNOSIS — R112 Nausea with vomiting, unspecified: Secondary | ICD-10-CM | POA: Insufficient documentation

## 2012-09-14 DIAGNOSIS — R509 Fever, unspecified: Secondary | ICD-10-CM | POA: Insufficient documentation

## 2012-09-14 DIAGNOSIS — Z86718 Personal history of other venous thrombosis and embolism: Secondary | ICD-10-CM | POA: Insufficient documentation

## 2012-09-14 DIAGNOSIS — R1900 Intra-abdominal and pelvic swelling, mass and lump, unspecified site: Secondary | ICD-10-CM | POA: Insufficient documentation

## 2012-09-14 DIAGNOSIS — J45909 Unspecified asthma, uncomplicated: Secondary | ICD-10-CM | POA: Insufficient documentation

## 2012-09-14 DIAGNOSIS — Z7902 Long term (current) use of antithrombotics/antiplatelets: Secondary | ICD-10-CM | POA: Insufficient documentation

## 2012-09-14 DIAGNOSIS — Z8679 Personal history of other diseases of the circulatory system: Secondary | ICD-10-CM | POA: Insufficient documentation

## 2012-09-14 HISTORY — DX: Fibromyalgia: M79.7

## 2012-09-14 LAB — URINALYSIS, ROUTINE W REFLEX MICROSCOPIC
Bilirubin Urine: NEGATIVE
Glucose, UA: NEGATIVE mg/dL
Ketones, ur: NEGATIVE mg/dL
Protein, ur: NEGATIVE mg/dL
pH: 6.5 (ref 5.0–8.0)

## 2012-09-14 LAB — LIPASE, BLOOD: Lipase: 16 U/L (ref 11–59)

## 2012-09-14 LAB — COMPREHENSIVE METABOLIC PANEL
Albumin: 3.4 g/dL — ABNORMAL LOW (ref 3.5–5.2)
Alkaline Phosphatase: 104 U/L (ref 39–117)
BUN: 23 mg/dL (ref 6–23)
CO2: 27 mEq/L (ref 19–32)
Chloride: 101 mEq/L (ref 96–112)
Creatinine, Ser: 1.1 mg/dL (ref 0.50–1.10)
GFR calc non Af Amer: 46 mL/min — ABNORMAL LOW (ref >90)
Potassium: 3.1 mEq/L — ABNORMAL LOW (ref 3.5–5.1)
Total Bilirubin: 0.3 mg/dL (ref 0.3–1.2)

## 2012-09-14 LAB — CBC WITH DIFFERENTIAL/PLATELET
Basophils Relative: 0 % (ref 0–1)
HCT: 34.2 % — ABNORMAL LOW (ref 36.0–46.0)
Hemoglobin: 11.2 g/dL — ABNORMAL LOW (ref 12.0–15.0)
Lymphocytes Relative: 9 % — ABNORMAL LOW (ref 12–46)
Lymphs Abs: 0.8 10*3/uL (ref 0.7–4.0)
Monocytes Absolute: 0.6 10*3/uL (ref 0.1–1.0)
Monocytes Relative: 6 % (ref 3–12)
Neutro Abs: 7.5 10*3/uL (ref 1.7–7.7)
Neutrophils Relative %: 85 % — ABNORMAL HIGH (ref 43–77)
RBC: 4.13 MIL/uL (ref 3.87–5.11)
WBC: 8.8 10*3/uL (ref 4.0–10.5)

## 2012-09-14 LAB — APTT: aPTT: 74 seconds — ABNORMAL HIGH (ref 24–37)

## 2012-09-14 LAB — OCCULT BLOOD X 1 CARD TO LAB, STOOL: Fecal Occult Bld: NEGATIVE

## 2012-09-14 MED ORDER — AZITHROMYCIN 250 MG PO TABS: 250.0000 mg | ORAL_TABLET | ORAL | Status: DC

## 2012-09-14 MED ORDER — POTASSIUM CHLORIDE CRYS ER 20 MEQ PO TBCR
30.0000 meq | EXTENDED_RELEASE_TABLET | Freq: Once | ORAL | Status: AC
  Administered 2012-09-14: 30 meq via ORAL
  Filled 2012-09-14: qty 2

## 2012-09-14 MED ORDER — AZITHROMYCIN 250 MG PO TABS: ORAL_TABLET | ORAL | Status: DC

## 2012-09-14 MED ORDER — SODIUM CHLORIDE 0.9 % IV BOLUS (SEPSIS)
500.0000 mL | Freq: Once | INTRAVENOUS | Status: AC
  Administered 2012-09-14: 500 mL via INTRAVENOUS

## 2012-09-14 MED ORDER — ONDANSETRON HCL 4 MG PO TABS: 4.0000 mg | ORAL_TABLET | Freq: Four times a day (QID) | ORAL | Status: DC

## 2012-09-14 MED ORDER — ONDANSETRON HCL 4 MG/2ML IJ SOLN
4.0000 mg | Freq: Once | INTRAMUSCULAR | Status: AC
  Administered 2012-09-14: 4 mg via INTRAVENOUS
  Filled 2012-09-14: qty 2

## 2012-09-14 MED ORDER — ONDANSETRON 8 MG PO TBDP
8.0000 mg | ORAL_TABLET | Freq: Once | ORAL | Status: AC
  Administered 2012-09-14: 8 mg via ORAL
  Filled 2012-09-14: qty 1

## 2012-09-14 MED ORDER — MAGNESIUM SULFATE 50 % IJ SOLN: 1.0000 g | Freq: Once | INTRAMUSCULAR | Status: DC

## 2012-09-14 MED ORDER — LEVOFLOXACIN 500 MG PO TABS
500.0000 mg | ORAL_TABLET | Freq: Once | ORAL | Status: AC
  Administered 2012-09-14: 500 mg via ORAL
  Filled 2012-09-14: qty 1

## 2012-09-14 NOTE — ED Notes (Signed)
Pt was given graham crackers to snack on until she is discharged to home. Pt states that she feels much better and now wishes to be d/c'd to home.  Dr Rosalia Hammers notified.  Discharge to be completed shortly.

## 2012-09-14 NOTE — ED Notes (Signed)
Nausea for two days, emesis and fever today.  No diarrhea.

## 2012-09-14 NOTE — ED Notes (Signed)
Pt attempted to provide urine specimen but was unable to urinate in the top hat placed in the toilet.  Pt ambulated with family member's assistance to and from the restroom without difficulty.  Will reassess shortly to see if she can provide a urine specimen.

## 2012-09-14 NOTE — ED Provider Notes (Signed)
History     CSN: [id number]  Arrival date & time 09/14/12  1122   First MD Initiated Contact with Patient 09/14/12 1430      Chief Complaint  Patient presents with  . Nausea  . Emesis  . Fever    (Consider location/radiation/quality/duration/timing/severity/associated sxs/prior treatment) HPI  Dr. Renne Crigler  79 y.o. Female with multiple health problems who lives at home presents today with nausea that began yesterday and vomiting that began at supper yesterday.  She vomited about ten times during the night.  She denies blood or mucous, with last oral intake yesterday about 5 p.m.  She has attempted to take a small amount of gingerale today but vomited after, she has not had diarrhea with last bm yesterday and normal.  Chills yesterday and through night with fever to 101.9 at home.  Some recent sick contacts of family members.  Patient had flu shot this year.    Past Medical History  Diagnosis Date  . DVT (deep venous thrombosis)   . Asthma   . Arthritis   . Stroke   . Hypertension   . Atrial fibrillation   . Right rib fracture   . Fibromyalgia     Past Surgical History  Procedure Laterality Date  . Abdominal hysterectomy    . Cholecystectomy    . Knee surgery    . Endovascular stent insertion    . Eye surgery      No family history on file. Great grand child with recent stomach illness History  Substance Use Topics  . Smoking status: Never Smoker   . Smokeless tobacco: Never Used  . Alcohol Use: No    OB History   Grav Para Term Preterm Abortions TAB SAB Ect Mult Living                  Review of Systems  Constitutional: Positive for fever and chills.  HENT: Negative.   Eyes: Negative.   Cardiovascular: Negative.     Allergies  Solarcaine aloe extra; Septra; and Cephalexin  Home Medications   Current Outpatient Rx  Name  Route  Sig  Dispense  Refill  . albuterol (PROVENTIL HFA;VENTOLIN HFA) 108 (90 BASE) MCG/ACT inhaler   Inhalation   Inhale 2  puffs into the lungs every 6 (six) hours as needed.           Marland Kitchen aspirin 81 MG tablet   Oral   Take 81 mg by mouth daily.           . Cholecalciferol (VITAMIN D) 2000 UNITS tablet   Oral   Take 2,000 Units by mouth daily.         . clopidogrel (PLAVIX) 75 MG tablet   Oral   Take 75 mg by mouth daily.           . cyanocobalamin 500 MCG tablet   Oral   Take 500 mcg by mouth daily.         Marland Kitchen diltiazem (CARDIZEM CD) 240 MG 24 hr capsule   Oral   Take 240 mg by mouth daily.           Marland Kitchen FeFum-FePoly-FA-B Cmp-C-Biot (INTEGRA PLUS PO)   Oral   Take by mouth.         . Fluticasone-Salmeterol (ADVAIR) 100-50 MCG/DOSE AEPB   Inhalation   Inhale 1 puff into the lungs every 12 (twelve) hours.           Marland Kitchen HYDROcodone-acetaminophen (VICODIN) 5-500 MG per tablet  Oral   Take 1 tablet by mouth every 6 (six) hours as needed. For pain         . losartan-hydrochlorothiazide (HYZAAR) 100-25 MG per tablet   Oral   Take 1 tablet by mouth daily.           . meloxicam (MOBIC) 7.5 MG tablet   Oral   Take 15 mg by mouth daily.          . montelukast (SINGULAIR) 10 MG tablet   Oral   Take 10 mg by mouth at bedtime.         Marland Kitchen oxyCODONE-acetaminophen (PERCOCET) 5-325 MG per tablet   Oral   Take 1 tablet by mouth every 6 (six) hours as needed for pain.   10 tablet   0   . pantoprazole (PROTONIX) 40 MG tablet   Oral   Take 40 mg by mouth daily.         . polyethylene glycol (MIRALAX / GLYCOLAX) packet   Oral   Take 17 g by mouth daily as needed.          . venlafaxine XR (EFFEXOR-XR) 150 MG 24 hr capsule   Oral   Take 150 mg by mouth daily.           BP 135/65  Pulse 106  Temp(Src) 100.3 F (37.9 C) (Oral)  Resp 20  Ht 5\' 3"  (1.6 m)  Wt 175 lb (79.379 kg)  BMI 31.01 kg/m2  SpO2 94%  Physical Exam  Nursing note and vitals reviewed. Constitutional: She is oriented to person, place, and time. She appears well-developed and well-nourished.   HENT:  Head: Normocephalic and atraumatic.  Right Ear: External ear normal.  Left Ear: External ear normal.  Nose: Nose normal.  Mouth/Throat: Oropharynx is clear and moist.  Eyes: Conjunctivae are normal. Pupils are equal, round, and reactive to light.  Neck: Normal range of motion. Neck supple.  Cardiovascular: Normal rate, regular rhythm, normal heart sounds and intact distal pulses.   Pulmonary/Chest: Effort normal and breath sounds normal.  Abdominal: Soft. Bowel sounds are normal. She exhibits mass. She exhibits no distension. There is no tenderness. There is no rebound and no guarding.  Genitourinary: Guaiac negative stool.  Musculoskeletal: Normal range of motion.  Neurological: She is alert and oriented to person, place, and time. She has normal reflexes.  Skin: Skin is warm.  Psychiatric: She has a normal mood and affect.    ED Course  Procedures (including critical care time)  Labs Reviewed  CBC WITH DIFFERENTIAL - Abnormal; Notable for the following:    Hemoglobin 11.2 (*)    HCT 34.2 (*)    Neutrophils Relative 85 (*)    Lymphocytes Relative 9 (*)    All other components within normal limits  COMPREHENSIVE METABOLIC PANEL - Abnormal; Notable for the following:    Potassium 3.1 (*)    Glucose, Bld 110 (*)    Albumin 3.4 (*)    GFR calc non Af Amer 46 (*)    GFR calc Af Amer 54 (*)    All other components within normal limits  APTT - Abnormal; Notable for the following:    aPTT 74 (*)    All other components within normal limits  LIPASE, BLOOD  PROTIME-INR  URINALYSIS, ROUTINE W REFLEX MICROSCOPIC   No results found.   No diagnosis found.   Dg Chest 2 View  09/14/2012  *RADIOLOGY REPORT*  Clinical Data: Fever, vomiting.  CHEST - 2 VIEW  Comparison: 07/04/2012  Findings: Prominent density in the right cardiophrenic angle, likely eventration of the right hemidiaphragm, stable.  Improving bibasilar atelectasis since prior study.  There is a focal opacity at  the right lower lung.  Cannot exclude early infiltrate/pneumonia.  No effusions.  No acute bony abnormality.  IMPRESSION: Question early infiltrate/pneumonia in the right lower lung base.  Eventration of the right hemidiaphragm.  Hiatal hernia.   Original Report Authenticated By: Charlett Nose, M.D.      Patient with possible early rll infiltrate.  Patient taking po well.  Potassium slightly low and given po replenishment.  Patient with resolution of nausea and taking po well here.  Patient given dose of levaquin due to allergy to cephalexin and likely prospect of admission given ongoing vomiting at home.  Patient did well here in ed and given risk of hospitalization patient and daughter voiced agreement with op treatment.  Patient placed on z-pack and advised close follow.        Hilario Quarry, MD 09/14/12 1806

## 2012-10-17 ENCOUNTER — Ambulatory Visit: Payer: Self-pay | Admitting: Podiatry

## 2012-10-24 ENCOUNTER — Ambulatory Visit: Payer: Self-pay | Admitting: Podiatry

## 2012-11-10 ENCOUNTER — Other Ambulatory Visit: Payer: Self-pay | Admitting: Orthopaedic Surgery

## 2012-11-11 ENCOUNTER — Encounter: Payer: Self-pay | Admitting: Podiatry

## 2012-11-11 ENCOUNTER — Ambulatory Visit (INDEPENDENT_AMBULATORY_CARE_PROVIDER_SITE_OTHER): Payer: Medicare Other | Admitting: Podiatry

## 2012-11-11 VITALS — BP 144/111 | HR 74

## 2012-11-11 DIAGNOSIS — M25579 Pain in unspecified ankle and joints of unspecified foot: Secondary | ICD-10-CM | POA: Insufficient documentation

## 2012-11-11 DIAGNOSIS — B351 Tinea unguium: Secondary | ICD-10-CM | POA: Insufficient documentation

## 2012-11-11 NOTE — Progress Notes (Signed)
Subjective: 79 y.o. year old female patient presents aided by walker complaining of painful nails. Patient requests toe nails, corns and calluses trimmed.   Objective: Dermatologic: Thick yellow deformed nails x 10.  Digital corn on distal end of 3rd digit right, painful to ambulate. Vascular: Pedal pulses are faintly palpable, . Orthopedic: Contracted lesser digits bilateral. Neurologic: All epicritic and tactile sensations grossly intact.  Assessment: Dystrophic mycotic nails x 10. Painful toes.  Treatment: All mycotic nails, corns, calluses debrided.  Return in 3 months or as needed.

## 2012-11-17 ENCOUNTER — Ambulatory Visit: Payer: Self-pay | Admitting: Podiatry

## 2012-11-18 ENCOUNTER — Encounter (HOSPITAL_COMMUNITY): Payer: Self-pay | Admitting: Pharmacy Technician

## 2012-11-25 ENCOUNTER — Encounter (HOSPITAL_COMMUNITY): Payer: Self-pay

## 2012-11-25 ENCOUNTER — Ambulatory Visit (HOSPITAL_COMMUNITY)
Admission: RE | Admit: 2012-11-25 | Discharge: 2012-11-25 | Disposition: A | Discharge status: Home / Self Care | Payer: Medicare Other | Source: Ambulatory Visit | Attending: Orthopaedic Surgery | Admitting: Orthopaedic Surgery

## 2012-11-25 ENCOUNTER — Encounter (HOSPITAL_COMMUNITY)
Admission: RE | Admit: 2012-11-25 | Discharge: 2012-11-25 | Disposition: A | Discharge status: Home / Self Care | Payer: Medicare Other | Source: Ambulatory Visit | Attending: Orthopaedic Surgery | Admitting: Orthopaedic Surgery

## 2012-11-25 DIAGNOSIS — Z86718 Personal history of other venous thrombosis and embolism: Secondary | ICD-10-CM | POA: Insufficient documentation

## 2012-11-25 DIAGNOSIS — K449 Diaphragmatic hernia without obstruction or gangrene: Secondary | ICD-10-CM | POA: Insufficient documentation

## 2012-11-25 DIAGNOSIS — Z01818 Encounter for other preprocedural examination: Secondary | ICD-10-CM | POA: Insufficient documentation

## 2012-11-25 DIAGNOSIS — R9431 Abnormal electrocardiogram [ECG] [EKG]: Secondary | ICD-10-CM | POA: Insufficient documentation

## 2012-11-25 DIAGNOSIS — Z8673 Personal history of transient ischemic attack (TIA), and cerebral infarction without residual deficits: Secondary | ICD-10-CM | POA: Insufficient documentation

## 2012-11-25 DIAGNOSIS — I10 Essential (primary) hypertension: Secondary | ICD-10-CM | POA: Insufficient documentation

## 2012-11-25 DIAGNOSIS — Z0183 Encounter for blood typing: Secondary | ICD-10-CM | POA: Insufficient documentation

## 2012-11-25 DIAGNOSIS — IMO0001 Reserved for inherently not codable concepts without codable children: Secondary | ICD-10-CM | POA: Insufficient documentation

## 2012-11-25 DIAGNOSIS — Z01812 Encounter for preprocedural laboratory examination: Secondary | ICD-10-CM | POA: Insufficient documentation

## 2012-11-25 DIAGNOSIS — K219 Gastro-esophageal reflux disease without esophagitis: Secondary | ICD-10-CM | POA: Insufficient documentation

## 2012-11-25 DIAGNOSIS — Z01810 Encounter for preprocedural cardiovascular examination: Secondary | ICD-10-CM | POA: Insufficient documentation

## 2012-11-25 HISTORY — DX: Major depressive disorder, single episode, unspecified: F32.9

## 2012-11-25 HISTORY — DX: Cardiac arrhythmia, unspecified: I49.9

## 2012-11-25 HISTORY — DX: Pneumonia, unspecified organism: J18.9

## 2012-11-25 HISTORY — DX: Gastro-esophageal reflux disease without esophagitis: K21.9

## 2012-11-25 HISTORY — DX: Anxiety disorder, unspecified: F41.9

## 2012-11-25 HISTORY — DX: Personal history of other diseases of the digestive system: Z87.19

## 2012-11-25 HISTORY — DX: Mental disorder, not otherwise specified: F99

## 2012-11-25 HISTORY — DX: Depression, unspecified: F32.A

## 2012-11-25 HISTORY — DX: Peripheral vascular disease, unspecified: I73.9

## 2012-11-25 HISTORY — DX: Anemia, unspecified: D64.9

## 2012-11-25 LAB — CBC WITH DIFFERENTIAL/PLATELET
Basophils Absolute: 0 10*3/uL (ref 0.0–0.1)
Eosinophils Absolute: 0.2 10*3/uL (ref 0.0–0.7)
Eosinophils Relative: 2 % (ref 0–5)
HCT: 31.7 % — ABNORMAL LOW (ref 36.0–46.0)
Lymphocytes Relative: 26 % (ref 12–46)
MCH: 26.6 pg (ref 26.0–34.0)
MCHC: 31.9 g/dL (ref 30.0–36.0)
MCV: 83.4 fL (ref 78.0–100.0)
Monocytes Absolute: 0.7 10*3/uL (ref 0.1–1.0)
Platelets: 297 10*3/uL (ref 150–400)
RDW: 15.3 % (ref 11.5–15.5)

## 2012-11-25 LAB — BASIC METABOLIC PANEL
BUN: 29 mg/dL — ABNORMAL HIGH (ref 6–23)
CO2: 29 mEq/L (ref 19–32)
Calcium: 10.1 mg/dL (ref 8.4–10.5)
Chloride: 100 mEq/L (ref 96–112)
Creatinine, Ser: 1.35 mg/dL — ABNORMAL HIGH (ref 0.50–1.10)
Glucose, Bld: 98 mg/dL (ref 70–99)

## 2012-11-25 LAB — URINALYSIS, ROUTINE W REFLEX MICROSCOPIC
Bilirubin Urine: NEGATIVE
Hgb urine dipstick: NEGATIVE
Ketones, ur: NEGATIVE mg/dL
Specific Gravity, Urine: 1.02 (ref 1.005–1.030)
pH: 6 (ref 5.0–8.0)

## 2012-11-25 LAB — URINE MICROSCOPIC-ADD ON

## 2012-11-25 LAB — PROTIME-INR: Prothrombin Time: 13.4 seconds (ref 11.6–15.2)

## 2012-11-25 LAB — APTT: aPTT: 67 seconds — ABNORMAL HIGH (ref 24–37)

## 2012-11-25 LAB — SURGICAL PCR SCREEN
MRSA, PCR: POSITIVE — AB
Staphylococcus aureus: POSITIVE — AB

## 2012-11-25 NOTE — Pre-Procedure Instructions (Addendum)
Natalie Kent  11/25/2012   Your procedure is scheduled on:  12-30-2012  Report to [facility] at 530 AM.  Call this number if you have problems the morning of surgery: [phone]   Remember:   Do not eat food or drink liquids after midnight.   Take these medicines the morning of surgery with A SIP OF WATER: inhalers, diltiazem, pain med, , protonix, effexor             STOP  Aspirin, plavix per dr   Drucilla Schmidt not wear jewelry, make-up or nail polish.  Do not wear lotions, powders, or perfumes. You may wear deodorant.  Do not shave 48 hours prior to surgery. Men may shave face and neck.  Do not bring valuables to the hospital.  [facility] is not responsible                   for any belongings or valuables.  Contacts, dentures or bridgework may not be worn into surgery.  Leave suitcase in the car. After surgery it may be brought to your room.  For patients admitted to the hospital, checkout time is 11:00 AM the day of  discharge.   Patients discharged the day of surgery will not be allowed to drive  home.  Name and phone number of your driver:   Special Instructions: Incentive Spirometry - Practice and bring it with you on the day of surgery. Shower using CHG 2 nights before surgery and the night before surgery.  If you shower the day of surgery use CHG.  Use special wash - you have one bottle of CHG for all showers.  You should use approximately 1/3 of the bottle for each shower.   Please read over the following fact sheets that you were given: Pain Booklet, Coughing and Deep Breathing, Blood Transfusion Information, Total Joint Packet, MRSA Information and Surgical Site Infection Prevention

## 2012-11-25 NOTE — Progress Notes (Addendum)
Natalie Kent at office to consult with dr Jerl Santos re: stopping plavix ,and call patient

## 2012-11-25 NOTE — Progress Notes (Signed)
Note from dr Elease Hashimoto 10/13, stress 10/13 echo 06

## 2012-11-26 ENCOUNTER — Encounter (HOSPITAL_COMMUNITY): Payer: Self-pay

## 2012-11-26 LAB — URINE CULTURE

## 2012-11-26 NOTE — Progress Notes (Signed)
Anesthesia chart review: Patient is a 79 year old female scheduled for right total hip arthroplasty, anterior approach by Dr. Jerl Santos on 11/29/2012. Case is posted for choice anesthesia.  History includes nonsmoker, remote history of DVT, sinus arrhythmia, hypertension, fibromyalgia, anxiety, CVA 03/2005, PTA stenting left MCA 08/2005, 09/2006 with intracranial angioplasty 01/2007, GERD, hiatal hernia, anemia, depression.  There are notes from 02/11/07 that mention she has a chronically elevated PTT of uncertain etiology. She does not think that she has seen a hematologist in the past and denied history of excessive bleeding with procedures. PCP is Dr. Merri Brunette.  She was evaluated by cardiologist Dr. Elease Hashimoto on 03/10/12 for evaluation of recent chest pain.  He ordered a stress test with PRN cardiology follow-up since it was low risk.      Nuclear stress test on 03/18/12 showed: Normal stress nuclear study. LV systolic function not assessed. LV Ejection Fraction: Study not gated. LV Wall Motion: Study not gated.   Echo on 04/24/05 showed: - Overall left ventricular systolic function was normal. Left ventricular ejection fraction was estimated , range being 55 % to 65 %.. Although no diagnostic left ventricular regional wall motion abnormality was identified, this possibility cannot be completely excluded on the basis of this study. Left ventricular wall thickness was mildly increased. There was an increased relative contribution of atrial contraction to left ventricular filling. - The left atrium was mildly dilated.  EKG on 11/25/12 showed NSR with sinus arrhythmia, poor r wave progression, minimal voltage criteria for LVH, lateral T wave abnormality consider ischemia (negative T wave in aVL otherwise fairly non-specific).  Overall, it was not felt significantly changed since her EKG on 03/06/12.    CXR on 11/25/12 showed no acute findings.  Preoperative labs noted.  H/H 10.1/31.7, stable since 09/2009.   Cr 1.35.  Glucose 98.  PT/INR WNL.  PTT elevated at 67.  (I do not see that she is on any typical agents that would increase her PTT.  I did find that very rarely elderly patients can exhibit some elevation in their PTT, but it's unclear if it is playing any role.  I believe that all of her previous PT/PTT in Epic were when she was already on Plavix and possibly Heparin post MCA interventions.  She last saw Dr. Corliss Skains on 08/18/12, and she is now only taking Plavix every other day and reports that Dr. Nolon Nations office instructed her to stay on this thru the perioperative period.)  UA showed large leukocytes, negative nitrites, few bacteria.  Culture is pending.  WBC is 8.6.  I spoke with Olegario Messier at Dr. Nolon Nations office regarding abnormal UA with pending culture and elevated PTT.  She reviewed with MD who plans to start patient on Cipro and wants PTT repeated on the day of surgery.  I also reviewed elevated PTT history with anesthesiologist Dr. Noreene Larsson.  Anesthesiologist and surgeon will evaluate patient and review follow-up PTT on the day of surgery to determine the definitve anesthesia plan.  Since patient is on Plavix with elevated PTT, she will likely require GA.  Velna Ochs [facility]/Anesthesiology Phone [phone] 11/26/2012 2:04 PM

## 2012-11-26 NOTE — H&P (Signed)
TOTAL HIP ADMISSION H&P  Patient is admitted for right total hip arthroplasty.  Subjective:  Chief Complaint: right hip pain  HPI: Natalie Kent, 79 y.o. female, has a history of pain and functional disability in the right hip(s) due to arthritis and patient has failed non-surgical conservative treatments for greater than 12 weeks to include NSAID's and/or analgesics, flexibility and strengthening excercises, use of assistive devices and activity modification.  Onset of symptoms was gradual starting 4 years ago with gradually worsening course since that time.The patient noted no past surgery on the right hip(s).  Patient currently rates pain in the right hip at 8 out of 10 with activity. Patient has night pain, worsening of pain with activity and weight bearing, trendelenberg gait, pain that interfers with activities of daily living and pain with passive range of motion. Patient has evidence of subchondral sclerosis, periarticular osteophytes and joint space narrowing by imaging studies. This condition presents safety issues increasing the risk of falls. This patient has had none.  There is no current active infection.  Patient Active Problem List   Diagnosis Date Noted  . Onychomycosis 11/11/2012  . Pain in joint, ankle and foot 11/11/2012  . Chest tightness 03/10/2012   Past Medical History  Diagnosis Date  . DVT (deep venous thrombosis)   . Asthma   . Arthritis   . Hypertension   . Right rib fracture   . Fibromyalgia   . Mental disorder   . Anxiety   . Depression   . Pneumonia     hx  . Peripheral vascular disease 70's    groin blood clot  . GERD (gastroesophageal reflux disease)   . H/O hiatal hernia   . Anemia     hx  . Dysrhythmia     "irregular" heart rate --- history of sinus arrhythmia  . Stroke 03/2005    Past Surgical History  Procedure Laterality Date  . Abdominal hysterectomy    . Cholecystectomy    . Knee surgery Bilateral   . Endovascular stent insertion       left MCA stent 08/2005, 09/2006, intracranial angioplasty 01/2007  . Eye surgery Bilateral     cat  . Varicose vein surgery      No prescriptions prior to admission   Allergies  Allergen Reactions  . Solarcaine Aloe Extra (Lidocaine Hcl)   . Septra (Bactrim)   . Cephalexin Rash    History  Substance Use Topics  . Smoking status: Never Smoker   . Smokeless tobacco: Never Used  . Alcohol Use: No    No family history on file.   Review of Systems  Constitutional: Negative.   HENT: Negative.   Eyes: Negative.   Respiratory: Negative.   Cardiovascular: Negative.   Gastrointestinal: Negative.   Genitourinary: Negative.   Musculoskeletal: Positive for joint pain.  Skin: Negative.   Neurological: Negative.   Endo/Heme/Allergies: Negative.   Psychiatric/Behavioral: Negative.     Objective:  Physical Exam  Constitutional: She appears well-nourished.  HENT:  Head: Atraumatic.  Eyes: Conjunctivae are normal.  Neck: Neck supple.  Cardiovascular: Normal rate.   Respiratory: Effort normal.  GI: Soft.  Musculoskeletal:  Right hip exam.  Walks with a antalgic gait.  Pain with any rotation of the right hip.  Rotation is decreased.  Good neurovascular status distally.  Neurological: She is alert.  Skin: Skin is warm.  Psychiatric: She has a normal mood and affect.    Vital signs in last 24 hours:  Labs:   Estimated body mass index is 31.01 kg/(m^2) as calculated from the following:   Height as of 09/14/12: 5\' 3"  (1.6 m).   Weight as of 09/14/12: 79.379 kg (175 lb).   Imaging Review Plain radiographs demonstrate severe degenerative joint disease of the right hip(s). The bone quality appears to be good for age and reported activity level.  Assessment/Plan:  End stage arthritis, right hip(s)  The patient history, physical examination, clinical judgement of the provider and imaging studies are consistent with end stage degenerative joint disease of the right hip(s)  and total hip arthroplasty is deemed medically necessary. The treatment options including medical management, injection therapy, arthroscopy and arthroplasty were discussed at length. The risks and benefits of total hip arthroplasty were presented and reviewed. The risks due to aseptic loosening, infection, stiffness, dislocation/subluxation,  thromboembolic complications and other imponderables were discussed.  The patient acknowledged the explanation, agreed to proceed with the plan and consent was signed. Patient is being admitted for inpatient treatment for surgery, pain control, PT, OT, prophylactic antibiotics, VTE prophylaxis, progressive ambulation and ADL's and discharge planning.The patient is planning to be discharged home with home health services

## 2012-12-01 MED ORDER — VANCOMYCIN HCL IN DEXTROSE 1-5 GM/200ML-% IV SOLN
1000.0000 mg | INTRAVENOUS | Status: AC
  Administered 2012-12-02: 1000 mg via INTRAVENOUS
  Filled 2012-12-01 (×2): qty 200

## 2012-12-01 MED ORDER — LACTATED RINGERS IV SOLN: INTRAVENOUS | Status: DC

## 2012-12-01 MED ORDER — CHLORHEXIDINE GLUCONATE 4 % EX LIQD: 60.0000 mL | Freq: Once | CUTANEOUS | Status: DC

## 2012-12-02 ENCOUNTER — Inpatient Hospital Stay (HOSPITAL_COMMUNITY)
Admission: RE | Admit: 2012-12-02 | Discharge: 2012-12-26 | DRG: 469 | Disposition: E | Payer: Medicare Other | Source: Ambulatory Visit | Attending: Pulmonary Disease | Admitting: Pulmonary Disease

## 2012-12-02 ENCOUNTER — Encounter (HOSPITAL_COMMUNITY): Admission: RE | Disposition: E | Payer: Self-pay | Source: Ambulatory Visit | Attending: Internal Medicine

## 2012-12-02 ENCOUNTER — Encounter (HOSPITAL_COMMUNITY): Payer: Self-pay | Admitting: *Deleted

## 2012-12-02 ENCOUNTER — Inpatient Hospital Stay (HOSPITAL_COMMUNITY): Payer: Medicare Other

## 2012-12-02 ENCOUNTER — Encounter (HOSPITAL_COMMUNITY): Payer: Self-pay | Admitting: Vascular Surgery

## 2012-12-02 ENCOUNTER — Inpatient Hospital Stay (HOSPITAL_COMMUNITY): Payer: Medicare Other | Admitting: Anesthesiology

## 2012-12-02 DIAGNOSIS — E872 Acidosis, unspecified: Secondary | ICD-10-CM | POA: Diagnosis not present

## 2012-12-02 DIAGNOSIS — R6521 Severe sepsis with septic shock: Secondary | ICD-10-CM

## 2012-12-02 DIAGNOSIS — R109 Unspecified abdominal pain: Secondary | ICD-10-CM

## 2012-12-02 DIAGNOSIS — J9600 Acute respiratory failure, unspecified whether with hypoxia or hypercapnia: Secondary | ICD-10-CM

## 2012-12-02 DIAGNOSIS — I959 Hypotension, unspecified: Secondary | ICD-10-CM

## 2012-12-02 DIAGNOSIS — J189 Pneumonia, unspecified organism: Secondary | ICD-10-CM | POA: Diagnosis not present

## 2012-12-02 DIAGNOSIS — K219 Gastro-esophageal reflux disease without esophagitis: Secondary | ICD-10-CM | POA: Diagnosis present

## 2012-12-02 DIAGNOSIS — J95821 Acute postprocedural respiratory failure: Secondary | ICD-10-CM | POA: Diagnosis not present

## 2012-12-02 DIAGNOSIS — F411 Generalized anxiety disorder: Secondary | ICD-10-CM | POA: Diagnosis present

## 2012-12-02 DIAGNOSIS — F329 Major depressive disorder, single episode, unspecified: Secondary | ICD-10-CM | POA: Diagnosis present

## 2012-12-02 DIAGNOSIS — R6520 Severe sepsis without septic shock: Secondary | ICD-10-CM | POA: Diagnosis not present

## 2012-12-02 DIAGNOSIS — J9601 Acute respiratory failure with hypoxia: Secondary | ICD-10-CM

## 2012-12-02 DIAGNOSIS — J4489 Other specified chronic obstructive pulmonary disease: Secondary | ICD-10-CM | POA: Diagnosis present

## 2012-12-02 DIAGNOSIS — D638 Anemia in other chronic diseases classified elsewhere: Secondary | ICD-10-CM | POA: Diagnosis present

## 2012-12-02 DIAGNOSIS — E46 Unspecified protein-calorie malnutrition: Secondary | ICD-10-CM | POA: Diagnosis not present

## 2012-12-02 DIAGNOSIS — M169 Osteoarthritis of hip, unspecified: Principal | ICD-10-CM | POA: Diagnosis present

## 2012-12-02 DIAGNOSIS — I4891 Unspecified atrial fibrillation: Secondary | ICD-10-CM | POA: Diagnosis present

## 2012-12-02 DIAGNOSIS — Z22322 Carrier or suspected carrier of Methicillin resistant Staphylococcus aureus: Secondary | ICD-10-CM

## 2012-12-02 DIAGNOSIS — I96 Gangrene, not elsewhere classified: Secondary | ICD-10-CM | POA: Diagnosis not present

## 2012-12-02 DIAGNOSIS — K922 Gastrointestinal hemorrhage, unspecified: Secondary | ICD-10-CM | POA: Diagnosis not present

## 2012-12-02 DIAGNOSIS — K5900 Constipation, unspecified: Secondary | ICD-10-CM | POA: Diagnosis not present

## 2012-12-02 DIAGNOSIS — N189 Chronic kidney disease, unspecified: Secondary | ICD-10-CM

## 2012-12-02 DIAGNOSIS — I129 Hypertensive chronic kidney disease with stage 1 through stage 4 chronic kidney disease, or unspecified chronic kidney disease: Secondary | ICD-10-CM | POA: Diagnosis present

## 2012-12-02 DIAGNOSIS — Z86718 Personal history of other venous thrombosis and embolism: Secondary | ICD-10-CM

## 2012-12-02 DIAGNOSIS — Z8673 Personal history of transient ischemic attack (TIA), and cerebral infarction without residual deficits: Secondary | ICD-10-CM

## 2012-12-02 DIAGNOSIS — A419 Sepsis, unspecified organism: Secondary | ICD-10-CM

## 2012-12-02 DIAGNOSIS — D509 Iron deficiency anemia, unspecified: Secondary | ICD-10-CM | POA: Diagnosis present

## 2012-12-02 DIAGNOSIS — R0789 Other chest pain: Secondary | ICD-10-CM

## 2012-12-02 DIAGNOSIS — IMO0001 Reserved for inherently not codable concepts without codable children: Secondary | ICD-10-CM | POA: Diagnosis present

## 2012-12-02 DIAGNOSIS — I739 Peripheral vascular disease, unspecified: Secondary | ICD-10-CM | POA: Diagnosis present

## 2012-12-02 DIAGNOSIS — N183 Chronic kidney disease, stage 3 unspecified: Secondary | ICD-10-CM | POA: Diagnosis present

## 2012-12-02 DIAGNOSIS — G9340 Encephalopathy, unspecified: Secondary | ICD-10-CM

## 2012-12-02 DIAGNOSIS — J449 Chronic obstructive pulmonary disease, unspecified: Secondary | ICD-10-CM | POA: Diagnosis present

## 2012-12-02 DIAGNOSIS — F3289 Other specified depressive episodes: Secondary | ICD-10-CM | POA: Diagnosis present

## 2012-12-02 DIAGNOSIS — E876 Hypokalemia: Secondary | ICD-10-CM | POA: Diagnosis not present

## 2012-12-02 DIAGNOSIS — R1310 Dysphagia, unspecified: Secondary | ICD-10-CM | POA: Diagnosis not present

## 2012-12-02 DIAGNOSIS — I421 Obstructive hypertrophic cardiomyopathy: Secondary | ICD-10-CM | POA: Diagnosis present

## 2012-12-02 DIAGNOSIS — Z9861 Coronary angioplasty status: Secondary | ICD-10-CM

## 2012-12-02 DIAGNOSIS — D62 Acute posthemorrhagic anemia: Secondary | ICD-10-CM | POA: Diagnosis not present

## 2012-12-02 DIAGNOSIS — N179 Acute kidney failure, unspecified: Secondary | ICD-10-CM

## 2012-12-02 DIAGNOSIS — E875 Hyperkalemia: Secondary | ICD-10-CM

## 2012-12-02 DIAGNOSIS — N170 Acute kidney failure with tubular necrosis: Secondary | ICD-10-CM | POA: Diagnosis not present

## 2012-12-02 DIAGNOSIS — B351 Tinea unguium: Secondary | ICD-10-CM | POA: Diagnosis present

## 2012-12-02 DIAGNOSIS — J45909 Unspecified asthma, uncomplicated: Secondary | ICD-10-CM | POA: Diagnosis present

## 2012-12-02 DIAGNOSIS — M1610 Unilateral primary osteoarthritis, unspecified hip: Principal | ICD-10-CM | POA: Diagnosis present

## 2012-12-02 HISTORY — PX: TOTAL HIP ARTHROPLASTY: SHX124

## 2012-12-02 LAB — BASIC METABOLIC PANEL
BUN: 30 mg/dL — ABNORMAL HIGH (ref 6–23)
CO2: 23 mEq/L (ref 19–32)
Chloride: 104 mEq/L (ref 96–112)
Glucose, Bld: 132 mg/dL — ABNORMAL HIGH (ref 70–99)
Potassium: 4.7 mEq/L (ref 3.5–5.1)
Sodium: 135 mEq/L (ref 135–145)

## 2012-12-02 LAB — CBC WITH DIFFERENTIAL/PLATELET
Eosinophils Absolute: 0 10*3/uL (ref 0.0–0.7)
Hemoglobin: 8.6 g/dL — ABNORMAL LOW (ref 12.0–15.0)
Lymphocytes Relative: 7 % — ABNORMAL LOW (ref 12–46)
Lymphs Abs: 1.2 10*3/uL (ref 0.7–4.0)
MCH: 27.9 pg (ref 26.0–34.0)
Monocytes Relative: 10 % (ref 3–12)
Neutro Abs: 14 10*3/uL — ABNORMAL HIGH (ref 1.7–7.7)
Neutrophils Relative %: 84 % — ABNORMAL HIGH (ref 43–77)
RBC: 3.08 MIL/uL — ABNORMAL LOW (ref 3.87–5.11)
WBC: 16.8 10*3/uL — ABNORMAL HIGH (ref 4.0–10.5)

## 2012-12-02 LAB — PREPARE RBC (CROSSMATCH)

## 2012-12-02 SURGERY — ARTHROPLASTY, HIP, TOTAL, ANTERIOR APPROACH
Anesthesia: General | Site: Hip | Laterality: Right | Wound class: Clean

## 2012-12-02 MED ORDER — TIOTROPIUM BROMIDE MONOHYDRATE 18 MCG IN CAPS
18.0000 ug | ORAL_CAPSULE | Freq: Every day | RESPIRATORY_TRACT | Status: DC
  Administered 2012-12-03 – 2012-12-04 (×2): 18 ug via RESPIRATORY_TRACT
  Filled 2012-12-02: qty 5

## 2012-12-02 MED ORDER — POTASSIUM CHLORIDE 2 MEQ/ML IV SOLN
INTRAVENOUS | Status: DC
  Administered 2012-12-02 – 2012-12-03 (×2): via INTRAVENOUS
  Filled 2012-12-02 (×3): qty 1000

## 2012-12-02 MED ORDER — LACTATED RINGERS IV SOLN
INTRAVENOUS | Status: DC | PRN
  Administered 2012-12-02: 07:00:00 via INTRAVENOUS

## 2012-12-02 MED ORDER — NEOSTIGMINE METHYLSULFATE 1 MG/ML IJ SOLN
INTRAMUSCULAR | Status: DC | PRN
  Administered 2012-12-02: 4 mg via INTRAVENOUS

## 2012-12-02 MED ORDER — ONDANSETRON HCL 4 MG/2ML IJ SOLN
4.0000 mg | Freq: Four times a day (QID) | INTRAMUSCULAR | Status: DC | PRN
  Administered 2012-12-02: 4 mg via INTRAVENOUS
  Filled 2012-12-02 (×2): qty 2

## 2012-12-02 MED ORDER — ALBUMIN HUMAN 5 % IV SOLN
INTRAVENOUS | Status: DC | PRN
  Administered 2012-12-02: 09:00:00 via INTRAVENOUS

## 2012-12-02 MED ORDER — ROCURONIUM BROMIDE 100 MG/10ML IV SOLN
INTRAVENOUS | Status: DC | PRN
  Administered 2012-12-02: 50 mg via INTRAVENOUS

## 2012-12-02 MED ORDER — DILTIAZEM HCL ER COATED BEADS 240 MG PO CP24
240.0000 mg | ORAL_CAPSULE | Freq: Every day | ORAL | Status: DC
Start: 2012-12-03 — End: 2012-12-03
  Administered 2012-12-03: 240 mg via ORAL
  Filled 2012-12-02: qty 1

## 2012-12-02 MED ORDER — LOSARTAN POTASSIUM 50 MG PO TABS
100.0000 mg | ORAL_TABLET | Freq: Every day | ORAL | Status: DC
  Filled 2012-12-02 (×2): qty 2

## 2012-12-02 MED ORDER — METOCLOPRAMIDE HCL 5 MG/ML IJ SOLN
5.0000 mg | Freq: Three times a day (TID) | INTRAMUSCULAR | Status: DC | PRN
  Filled 2012-12-02: qty 2

## 2012-12-02 MED ORDER — MONTELUKAST SODIUM 10 MG PO TABS
10.0000 mg | ORAL_TABLET | Freq: Every day | ORAL | Status: DC
  Administered 2012-12-02 – 2012-12-04 (×2): 10 mg via ORAL
  Filled 2012-12-02 (×3): qty 1

## 2012-12-02 MED ORDER — PANTOPRAZOLE SODIUM 40 MG PO TBEC
40.0000 mg | DELAYED_RELEASE_TABLET | Freq: Every day | ORAL | Status: DC
  Administered 2012-12-03: 40 mg via ORAL
  Filled 2012-12-02: qty 1

## 2012-12-02 MED ORDER — METHOCARBAMOL 500 MG PO TABS
ORAL_TABLET | ORAL | Status: AC
  Filled 2012-12-02: qty 1

## 2012-12-02 MED ORDER — POLYETHYLENE GLYCOL 3350 17 G PO PACK
17.0000 g | PACK | Freq: Every day | ORAL | Status: DC | PRN
  Administered 2012-12-03: 17 g via ORAL
  Filled 2012-12-02 (×2): qty 1

## 2012-12-02 MED ORDER — 0.9 % SODIUM CHLORIDE (POUR BTL) OPTIME
TOPICAL | Status: DC | PRN
  Administered 2012-12-02: 1000 mL

## 2012-12-02 MED ORDER — ONDANSETRON HCL 4 MG/2ML IJ SOLN
INTRAMUSCULAR | Status: DC | PRN
  Administered 2012-12-02: 4 mg via INTRAVENOUS

## 2012-12-02 MED ORDER — HYDROMORPHONE HCL PF 1 MG/ML IJ SOLN
INTRAMUSCULAR | Status: AC
  Filled 2012-12-02: qty 1

## 2012-12-02 MED ORDER — LIDOCAINE HCL (CARDIAC) 20 MG/ML IV SOLN
INTRAVENOUS | Status: DC | PRN
  Administered 2012-12-02: 50 mg via INTRAVENOUS

## 2012-12-02 MED ORDER — HYDROMORPHONE HCL PF 1 MG/ML IJ SOLN
0.2500 mg | INTRAMUSCULAR | Status: DC | PRN
  Administered 2012-12-02 (×2): 0.5 mg via INTRAVENOUS

## 2012-12-02 MED ORDER — ONDANSETRON HCL 4 MG PO TABS: 4.0000 mg | ORAL_TABLET | Freq: Four times a day (QID) | ORAL | Status: DC | PRN

## 2012-12-02 MED ORDER — ASPIRIN EC 325 MG PO TBEC
325.0000 mg | DELAYED_RELEASE_TABLET | Freq: Two times a day (BID) | ORAL | Status: DC
  Administered 2012-12-03 (×2): 325 mg via ORAL
  Filled 2012-12-02 (×5): qty 1

## 2012-12-02 MED ORDER — LOSARTAN POTASSIUM-HCTZ 100-25 MG PO TABS
1.0000 | ORAL_TABLET | Freq: Every day | ORAL | Status: DC
Start: 2012-12-02 — End: 2012-12-02

## 2012-12-02 MED ORDER — FENTANYL CITRATE 0.05 MG/ML IJ SOLN
INTRAMUSCULAR | Status: DC | PRN
  Administered 2012-12-02: 100 ug via INTRAVENOUS
  Administered 2012-12-02: 125 ug via INTRAVENOUS
  Administered 2012-12-02 (×2): 100 ug via INTRAVENOUS
  Administered 2012-12-02: 25 ug via INTRAVENOUS

## 2012-12-02 MED ORDER — ACETAMINOPHEN 650 MG RE SUPP
650.0000 mg | Freq: Four times a day (QID) | RECTAL | Status: DC | PRN
  Administered 2012-12-03 – 2012-12-04 (×2): 650 mg via RECTAL
  Filled 2012-12-02 (×2): qty 1

## 2012-12-02 MED ORDER — SODIUM CHLORIDE 0.9 % IV SOLN
10.0000 mg | INTRAVENOUS | Status: DC | PRN
  Administered 2012-12-02: 80 ug/min via INTRAVENOUS

## 2012-12-02 MED ORDER — VITAMIN D3 25 MCG (1000 UNIT) PO TABS
1000.0000 [IU] | ORAL_TABLET | Freq: Every day | ORAL | Status: DC
  Administered 2012-12-03: 1000 [IU] via ORAL
  Filled 2012-12-02 (×3): qty 1

## 2012-12-02 MED ORDER — NALOXONE HCL 0.4 MG/ML IJ SOLN
INTRAMUSCULAR | Status: AC
  Filled 2012-12-02: qty 1

## 2012-12-02 MED ORDER — VANCOMYCIN HCL IN DEXTROSE 1-5 GM/200ML-% IV SOLN
1000.0000 mg | Freq: Two times a day (BID) | INTRAVENOUS | Status: AC
  Administered 2012-12-02: 1000 mg via INTRAVENOUS
  Filled 2012-12-02: qty 200

## 2012-12-02 MED ORDER — ARTIFICIAL TEARS OP OINT
TOPICAL_OINTMENT | OPHTHALMIC | Status: DC | PRN
  Administered 2012-12-02: 1 via OPHTHALMIC

## 2012-12-02 MED ORDER — MENTHOL 3 MG MT LOZG: 1.0000 | LOZENGE | OROMUCOSAL | Status: DC | PRN

## 2012-12-02 MED ORDER — ACETAMINOPHEN 325 MG PO TABS: 650.0000 mg | ORAL_TABLET | Freq: Four times a day (QID) | ORAL | Status: DC | PRN

## 2012-12-02 MED ORDER — FERROUS SULFATE 325 (65 FE) MG PO TABS
325.0000 mg | ORAL_TABLET | Freq: Every day | ORAL | Status: DC
  Administered 2012-12-03: 325 mg via ORAL
  Filled 2012-12-02 (×2): qty 1

## 2012-12-02 MED ORDER — PROPOFOL 10 MG/ML IV BOLUS
INTRAVENOUS | Status: DC | PRN
  Administered 2012-12-02: 30 mg via INTRAVENOUS
  Administered 2012-12-02: 100 mg via INTRAVENOUS

## 2012-12-02 MED ORDER — BISACODYL 10 MG RE SUPP
10.0000 mg | Freq: Every day | RECTAL | Status: DC | PRN
  Filled 2012-12-02: qty 1

## 2012-12-02 MED ORDER — METHOCARBAMOL 500 MG PO TABS
500.0000 mg | ORAL_TABLET | Freq: Four times a day (QID) | ORAL | Status: DC | PRN
  Administered 2012-12-02 – 2012-12-03 (×2): 500 mg via ORAL
  Filled 2012-12-02 (×2): qty 1

## 2012-12-02 MED ORDER — ALUM & MAG HYDROXIDE-SIMETH 200-200-20 MG/5ML PO SUSP
30.0000 mL | ORAL | Status: DC | PRN
  Administered 2012-12-02: 30 mL via ORAL
  Filled 2012-12-02: qty 30

## 2012-12-02 MED ORDER — HYDROMORPHONE HCL PF 1 MG/ML IJ SOLN
0.5000 mg | INTRAMUSCULAR | Status: DC | PRN
  Administered 2012-12-02: 1 mg via INTRAVENOUS
  Administered 2012-12-03 (×2): 0.5 mg via INTRAVENOUS
  Filled 2012-12-02 (×3): qty 1

## 2012-12-02 MED ORDER — GLYCOPYRROLATE 0.2 MG/ML IJ SOLN
INTRAMUSCULAR | Status: DC | PRN
  Administered 2012-12-02: 0.6 mg via INTRAVENOUS

## 2012-12-02 MED ORDER — MOMETASONE FURO-FORMOTEROL FUM 200-5 MCG/ACT IN AERO
2.0000 | INHALATION_SPRAY | Freq: Two times a day (BID) | RESPIRATORY_TRACT | Status: DC
  Administered 2012-12-02 – 2012-12-04 (×4): 2 via RESPIRATORY_TRACT
  Filled 2012-12-02: qty 8.8

## 2012-12-02 MED ORDER — DOCUSATE SODIUM 100 MG PO CAPS
100.0000 mg | ORAL_CAPSULE | Freq: Two times a day (BID) | ORAL | Status: DC
  Administered 2012-12-02 – 2012-12-03 (×3): 100 mg via ORAL
  Filled 2012-12-02 (×2): qty 1

## 2012-12-02 MED ORDER — DEXTROSE 5 % IV SOLN
INTRAVENOUS | Status: DC | PRN
  Administered 2012-12-02: 08:00:00 via INTRAVENOUS

## 2012-12-02 MED ORDER — VENLAFAXINE HCL ER 150 MG PO CP24
150.0000 mg | ORAL_CAPSULE | Freq: Every morning | ORAL | Status: DC
  Administered 2012-12-03: 150 mg via ORAL
  Filled 2012-12-02 (×2): qty 1

## 2012-12-02 MED ORDER — METOCLOPRAMIDE HCL 10 MG PO TABS
5.0000 mg | ORAL_TABLET | Freq: Three times a day (TID) | ORAL | Status: DC | PRN
  Filled 2012-12-02: qty 1

## 2012-12-02 MED ORDER — CLOPIDOGREL BISULFATE 75 MG PO TABS
75.0000 mg | ORAL_TABLET | ORAL | Status: DC
  Administered 2012-12-03: 75 mg via ORAL
  Filled 2012-12-02: qty 1

## 2012-12-02 MED ORDER — METHOCARBAMOL 100 MG/ML IJ SOLN
500.0000 mg | Freq: Four times a day (QID) | INTRAVENOUS | Status: DC | PRN
  Filled 2012-12-02: qty 5

## 2012-12-02 MED ORDER — PHENOL 1.4 % MT LIQD: 1.0000 | OROMUCOSAL | Status: DC | PRN

## 2012-12-02 MED ORDER — HYDROCODONE-ACETAMINOPHEN 5-325 MG PO TABS
ORAL_TABLET | ORAL | Status: AC
  Filled 2012-12-02: qty 2

## 2012-12-02 MED ORDER — CYANOCOBALAMIN 1000 MCG/ML IJ SOLN: 1000.0000 ug | INTRAMUSCULAR | Status: DC

## 2012-12-02 MED ORDER — SODIUM CHLORIDE 0.9 % IV SOLN
INTRAVENOUS | Status: DC | PRN
  Administered 2012-12-02: 09:00:00 via INTRAVENOUS

## 2012-12-02 MED ORDER — ALBUTEROL SULFATE HFA 108 (90 BASE) MCG/ACT IN AERS
2.0000 | INHALATION_SPRAY | Freq: Four times a day (QID) | RESPIRATORY_TRACT | Status: DC | PRN
  Filled 2012-12-02: qty 6.7

## 2012-12-02 MED ORDER — HYDROCODONE-ACETAMINOPHEN 5-325 MG PO TABS
1.0000 | ORAL_TABLET | ORAL | Status: DC | PRN
  Administered 2012-12-02: 2 via ORAL
  Administered 2012-12-03: 1 via ORAL
  Filled 2012-12-02: qty 1

## 2012-12-02 MED ORDER — HYDROCHLOROTHIAZIDE 25 MG PO TABS
25.0000 mg | ORAL_TABLET | Freq: Every day | ORAL | Status: DC
  Filled 2012-12-02 (×2): qty 1

## 2012-12-02 MED ORDER — WHITE PETROLATUM GEL
Status: AC
  Administered 2012-12-02: 1
  Filled 2012-12-02: qty 5

## 2012-12-02 SURGICAL SUPPLY — 54 items
BLADE SAW SGTL 18X1.27X75 (BLADE) ×2 IMPLANT
BLADE SURG ROTATE 9660 (MISCELLANEOUS) IMPLANT
CAPT HIP PF MOP ×1 IMPLANT
CELLS DAT CNTRL 66122 CELL SVR (MISCELLANEOUS) ×1 IMPLANT
CLOTH BEACON ORANGE TIMEOUT ST (SAFETY) ×2 IMPLANT
COVER BACK TABLE 24X17X13 BIG (DRAPES) IMPLANT
COVER SURGICAL LIGHT HANDLE (MISCELLANEOUS) ×2 IMPLANT
DRAPE C-ARM 42X72 X-RAY (DRAPES) ×2 IMPLANT
DRAPE STERI IOBAN 125X83 (DRAPES) ×2 IMPLANT
DRAPE U-SHAPE 47X51 STRL (DRAPES) ×6 IMPLANT
DRESSING AQUACEL AQ EXTRA 4X5 (GAUZE/BANDAGES/DRESSINGS) ×2 IMPLANT
DRSG AQUACEL AG ADV 3.5X10 (GAUZE/BANDAGES/DRESSINGS) ×2 IMPLANT
DRSG MEPILEX BORDER 4X8 (GAUZE/BANDAGES/DRESSINGS) ×1 IMPLANT
DURAPREP 26ML APPLICATOR (WOUND CARE) ×2 IMPLANT
ELECT BLADE 4.0 EZ CLEAN MEGAD (MISCELLANEOUS)
ELECT BLADE TIP CTD 4 INCH (ELECTRODE) ×2 IMPLANT
ELECT CAUTERY BLADE 6.4 (BLADE) ×2 IMPLANT
ELECT REM PT RETURN 9FT ADLT (ELECTROSURGICAL) ×2
ELECTRODE BLDE 4.0 EZ CLN MEGD (MISCELLANEOUS) IMPLANT
ELECTRODE REM PT RTRN 9FT ADLT (ELECTROSURGICAL) ×1 IMPLANT
FACESHIELD LNG OPTICON STERILE (SAFETY) ×4 IMPLANT
GAUZE XEROFORM 1X8 LF (GAUZE/BANDAGES/DRESSINGS) ×2 IMPLANT
GLOVE BIO SURGEON STRL SZ8 (GLOVE) ×2 IMPLANT
GLOVE BIO SURGEON STRL SZ8.5 (GLOVE) ×2 IMPLANT
GLOVE BIOGEL PI IND STRL 8 (GLOVE) ×1 IMPLANT
GLOVE BIOGEL PI IND STRL 8.5 (GLOVE) ×1 IMPLANT
GLOVE BIOGEL PI INDICATOR 8 (GLOVE) ×1
GLOVE BIOGEL PI INDICATOR 8.5 (GLOVE) ×1
GOWN PREVENTION PLUS LG XLONG (DISPOSABLE) IMPLANT
GOWN STRL NON-REIN LRG LVL3 (GOWN DISPOSABLE) ×4 IMPLANT
GOWN STRL REIN XL XLG (GOWN DISPOSABLE) ×2 IMPLANT
KIT BASIN OR (CUSTOM PROCEDURE TRAY) ×2 IMPLANT
KIT ROOM TURNOVER OR (KITS) ×2 IMPLANT
MANIFOLD NEPTUNE II (INSTRUMENTS) ×2 IMPLANT
NS IRRIG 1000ML POUR BTL (IV SOLUTION) ×2 IMPLANT
PACK TOTAL JOINT (CUSTOM PROCEDURE TRAY) ×2 IMPLANT
PAD ARMBOARD 7.5X6 YLW CONV (MISCELLANEOUS) ×4 IMPLANT
RETRACTOR WND ALEXIS 18 MED (MISCELLANEOUS) ×1 IMPLANT
RTRCTR WOUND ALEXIS 18CM MED (MISCELLANEOUS) ×2
SPONGE LAP 18X18 X RAY DECT (DISPOSABLE) ×2 IMPLANT
SPONGE LAP 4X18 X RAY DECT (DISPOSABLE) IMPLANT
STAPLER VISISTAT 35W (STAPLE) ×2 IMPLANT
SUT ETHIBOND NAB CT1 #1 30IN (SUTURE) ×4 IMPLANT
SUT VIC AB 0 CT1 27 (SUTURE)
SUT VIC AB 0 CT1 27XBRD ANBCTR (SUTURE) IMPLANT
SUT VIC AB 1 CT1 27 (SUTURE) ×2
SUT VIC AB 1 CT1 27XBRD ANBCTR (SUTURE) ×1 IMPLANT
SUT VIC AB 2-0 CT1 27 (SUTURE) ×2
SUT VIC AB 2-0 CT1 TAPERPNT 27 (SUTURE) ×1 IMPLANT
SUT VLOC 180 0 24IN GS25 (SUTURE) ×2 IMPLANT
TOWEL OR 17X24 6PK STRL BLUE (TOWEL DISPOSABLE) ×2 IMPLANT
TOWEL OR 17X26 10 PK STRL BLUE (TOWEL DISPOSABLE) ×4 IMPLANT
TRAY FOLEY CATH 14FR (SET/KITS/TRAYS/PACK) IMPLANT
WATER STERILE IRR 1000ML POUR (IV SOLUTION) ×4 IMPLANT

## 2012-12-02 NOTE — Significant Event (Addendum)
Rapid Response Event Note  Overview:  Called for hypotension and lethargy Time Called: 1630 Arrival Time: 1632 Event Type: Hypotension  Initial Focused Assessment:  Upon arrival to patients room, family and RN at bedside.   Patient is lethargic, able to answer questions buts drifts back to sleep.  Patient on nasal cannula 2 lpm, patietn appears to be pale.  Patient also noted to have 50cc of urine output since arriving to floor at 1130.  Manual BP 68/40   Interventions:  500cc Bolus NS given, 0.2mg  IV narcan given, spoke with Kathlene November, [place].  Patient awoke after narcan given, alert and oriented.  Bladder scan done, results 0 cc noted.  Upon awakening EKG done and results reviewed.  BP responded at first then dropped again, gave another 500CC NS bolus.  Dr Jerl Santos now at bedside.  Family updated, BP at 1800 98/50, 61   Event Summary:   at      at          [place]

## 2012-12-02 NOTE — Anesthesia Postprocedure Evaluation (Signed)
  Anesthesia Post-op Note  Patient: Natalie Kent  Procedure(s) Performed: Procedure(s) with comments: TOTAL HIP ARTHROPLASTY ANTERIOR APPROACH (Right) - DEPUY  Patient Location: PACU  Anesthesia Type:General  Level of Consciousness: awake and alert   Airway and Oxygen Therapy: Patient Spontanous Breathing and Patient connected to nasal cannula oxygen  Post-op Pain: mild  Post-op Assessment: Post-op Vital signs reviewed, Patient's Cardiovascular Status Stable, Respiratory Function Stable, Patent Airway and No signs of Nausea or vomiting  Post-op Vital Signs: Reviewed and stable  Complications: No apparent anesthesia complications

## 2012-12-02 NOTE — Progress Notes (Signed)
UR COMPLETED  

## 2012-12-02 NOTE — Preoperative (Signed)
Beta Blockers   Reason not to administer Beta Blockers:Not Applicable 

## 2012-12-02 NOTE — Op Note (Signed)
PRE-OP DIAGNOSIS:  RIGHT HIP DEGENERATIVE JOINT DISEASE POST-OP DIAGNOSIS:  RIGHT HIP DEGENERATIVE JOINT DISEASE PROCEDURE:  Procedure(s): RIGHT TOTAL HIP ARTHROPLASTY ANTERIOR APPROACH ANESTHESIA:  General SURGEON:  Marcene Corning MD ASSISTANT:  Lindwood Qua PA-C   INDICATIONS FOR PROCEDURE:  The patient is a 79 y.o. female with a long history of a painful hip.  This has persisted despite multiple conservative measures.  The patient has persisted with pain and dysfunction making rest and activity difficult.  A total hip replacement is offered as surgical treatment.  Informed operative consent was obtained after discussion of possible complications including reaction to anesthesia, infection, neurovascular injury, dislocation, DVT, PE, and death.  The importance of the postoperative rehab program to optimize result was stressed with the patient.  SUMMARY OF FINDINGS AND PROCEDURE:  Under general anesthesia through a anterior approach an the Hana table a right THR was performed.  The patient had severe degenerative change and fair bone quality.  We used DePuy components to replace the hip and these were size KA12 Corail femur capped with a 32 mm +1 hip ball.  On the acetabular side we used a size 50 Gription shell with a  plus 4 neutral polyethylene liner.  We did not use a hole eliminator.  Bryna Colander assisted throughout and was invaluable to the completion of the case in that he helped position and retract while I performed the procedure.  He also closed simultaneously to help minimize OR time.  I used fluoroscopy throughout the case to check position of implants and leg lengths and read all of these views myself.  DESCRIPTION OF PROCEDURE:  The patient was taken to the OR suite where general anesthetic was applied.  The patient was then positioned on the Hana table supine.  All bony prominences were appropriately padded.  Prep and drape was then performed in normal sterile fashion.  The patient  was given vancomycin preoperative antibiotic and an appropriate time out was performed.  We then took an anterior approach to the right hip.  Dissection was taken through adipose to the tensor fascia lata fascia.  This structure was incised longitudinally and we dissected in the intermuscular interval just medial to this muscle.  Cobra retractors were placed superior and inferior to the femoral neck superficial to the capsule.  A capsular incision was then made and the retractors were placed along the femoral neck.  Xray was brought in to get a good level for the femoral neck cut which was made with an oscillating saw and osteotome.  The femoral head was removed with a corkscrew.  The acetabulum was exposed and some labral tissues were excised. Reaming was taken to the inside wall of the pelvis and sequentially up to 1 mm smaller than the actual component.  A trial of components was done and then the aforementioned acetabular shell was placed in appropriate tilt and anteversion confirmed by fluoroscopy. The liner was placed along with the hole eliminator and attention was turned to the femur.  The leg was brought down and over into adduction and the elevator bar was used to raise the femur up gently in the wound.  The piriformis was released with care taken to preserve the obturator internus attachment and all of the posterior capsule. The femur was reamed and then broached to the appropriate size.  A trial reduction was done and the aforementioned head and neck assembly gave [place] the best stability in extension with external rotation.  Leg lengths were felt to be  about equal by fluoroscopic exam.  The trial components were removed and the wound irrigated.  We then placed the femoral component in appropriate anteversion.  The head was applied to a dry stem neck and the hip again reduced.  It was again stable in the aforementioned position.  The would was irrigated again followed by re-approximation of anterior capsule  with ethibond suture. Tensor fascia was repaired with V-loc suture  followed by subcutaneous closure with #O and #2 undyed vicryl.  Skin was closed with staples followed by a sterile dressing.  EBL and IOF can be obtained from anesthesia records.  DISPOSITION:  The patient was extubated in the OR and taken to PACU in stable condition to be admitted to the Orthopedic Surgery for appropriate post-op care to include perioperative antibiotics and DVT prophylaxis.

## 2012-12-02 NOTE — Anesthesia Preprocedure Evaluation (Addendum)
Anesthesia Evaluation  Patient identified by MRN, date of birth, ID band Patient awake    Reviewed: Allergy & Precautions, H&P , NPO status , Patient's Chart, lab work & pertinent test results  Airway Mallampati: II TM Distance: >3 FB Neck ROM: Full    Dental no notable dental hx. (+) Edentulous Upper, Edentulous Lower and Dental Advisory Given   Pulmonary asthma , pneumonia -, resolved,  breath sounds clear to auscultation  Pulmonary exam normal       Cardiovascular hypertension, On Medications + Peripheral Vascular Disease + dysrhythmias Rhythm:Regular Rate:Normal     Neuro/Psych PSYCHIATRIC DISORDERS  Neuromuscular disease CVA    GI/Hepatic Neg liver ROS, hiatal hernia, GERD-  Medicated and Controlled,  Endo/Other  negative endocrine ROS  Renal/GU negative Renal ROS  negative genitourinary   Musculoskeletal   Abdominal   Peds  Hematology  (+) anemia ,   Anesthesia Other Findings   Reproductive/Obstetrics negative OB ROS                          Anesthesia Physical Anesthesia Plan  ASA: III  Anesthesia Plan: General   Post-op Pain Management:    Induction: Intravenous  Airway Management Planned: Oral ETT  Additional Equipment:   Intra-op Plan:   Post-operative Plan: Extubation in OR  Informed Consent: I have reviewed the patients History and Physical, chart, labs and discussed the procedure including the risks, benefits and alternatives for the proposed anesthesia with the patient or authorized representative who has indicated his/her understanding and acceptance.   Dental advisory given  Plan Discussed with: CRNA  Anesthesia Plan Comments:         Anesthesia Quick Evaluation

## 2012-12-02 NOTE — OR Nursing (Signed)
Less than 90 secs episode of  What sounded like stridor/ resolved by  Asking pt to close mouth and breathe slowly thru nose/ exhale same / BVM used ONLY to provide o2/ not assit with resp/ sats at 96 and maintained on 2lnc /  Dentures placed, situation resolved, pt "feels okay"

## 2012-12-02 NOTE — Interval H&P Note (Signed)
History and Physical Interval Note:  11/25/2012 7:00 AM  Natalie Kent  has presented today for surgery, with the diagnosis of RIGHT HIP DEGENERATIVE JOINT DISEASE  The various methods of treatment have been discussed with the patient and family. After consideration of risks, benefits and other options for treatment, the patient has consented to  Procedure(s) with comments: TOTAL HIP ARTHROPLASTY ANTERIOR APPROACH (Right) - DEPUY as a surgical intervention .  The patient's history has been reviewed, patient examined, no change in status, stable for surgery.  I have reviewed the patient's chart and labs.  Questions were answered to the patient's satisfaction.     Ibtisam Benge G

## 2012-12-02 NOTE — Plan of Care (Signed)
Problem: Consults Goal: Diagnosis- Total Joint Replacement Primary Total Hip Right     

## 2012-12-02 NOTE — Transfer of Care (Signed)
Immediate Anesthesia Transfer of Care Note  Patient: Natalie Kent  Procedure(s) Performed: Procedure(s) with comments: TOTAL HIP ARTHROPLASTY ANTERIOR APPROACH (Right) - DEPUY  Patient Location: PACU  Anesthesia Type:General  Level of Consciousness: awake and patient cooperative  Airway & Oxygen Therapy: Patient Spontanous Breathing and Patient connected to face mask oxygen  Post-op Assessment: Report given to PACU RN, Post -op Vital signs reviewed and stable and Patient moving all extremities  Post vital signs: Reviewed and stable  Complications: No apparent anesthesia complications

## 2012-12-02 NOTE — Anesthesia Procedure Notes (Signed)
Performed by: Hensley Aziz B     

## 2012-12-02 NOTE — Evaluation (Signed)
Physical Therapy Evaluation Patient Details Name: Natalie Kent MRN: [id number] DOB: [birth year] Today's Date: 11/28/2012 Time: 1350-1407 PT Time Calculation (min): 17 min  PT Assessment / Plan / Recommendation History of Present Illness  s/p elective R THA  Clinical Impression  Pt is s/p R anterior THA resulting in the deficits listed below (see PT Problem List). Pt very limited today due to drowsiness and lethargy. Patient's daughter attributes this to medicine. Patient and daughter are hopeful for HHPT upon D/C. Daughter can provide limited (A) at home due to disability. Pt will need to be supervision to mod I for safe D/C home. Pt will benefit from skilled PT to increase their independence and safety with mobility to allow discharge to the venue listed below.     PT Assessment  Patient needs continued PT services    Follow Up Recommendations  Home health PT;Supervision/Assistance - 24 hour    Does the patient have the potential to tolerate intense rehabilitation      Barriers to Discharge Other (comment) pt daughter who lives with her is disabled; limited (A) can be provided by her     Equipment Recommendations       Recommendations for Other Services OT consult   Frequency 7X/week    Precautions / Restrictions Precautions Precautions: Fall;Anterior Hip Precaution Comments: MRSA Restrictions Weight Bearing Restrictions: Yes RLE Weight Bearing: Weight bearing as tolerated   Pertinent Vitals/Pain Pt in no apparent distress; on RA for transfer O2 at 98%; pt placed on O2 when she began demo labored breathing and uncontrollable coughing; RN notified.      Mobility  Bed Mobility Bed Mobility: Supine to Sit;Sitting - Scoot to Edge of Bed Supine to Sit: 3: Mod assist;HOB elevated;With rails Sitting - Scoot to Edge of Bed: 3: Mod assist Details for Bed Mobility Assistance: with multimodal cues pt able to initate and advance bil LEs minimally to EOB; required (A) to advance  trunk to upright position; multimodal cues for hand placement and sequencing  Transfers Transfers: Sit to Stand;Stand to Sit;Stand Pivot Transfers Sit to Stand: 1: +2 Total assist;From bed;From elevated surface Sit to Stand: Patient Percentage: 30% Stand to Sit: 1: +2 Total assist;To chair/3-in-1;With upper extremity assist Stand to Sit: Patient Percentage: 30% Stand Pivot Transfers: 1: +2 Total assist;From elevated surface Stand Pivot Transfers: Patient Percentage: 20% Details for Transfer Assistance: pt was able to push up minimally and WB through bil LEs; pt required 2+ for safety and to complete transfers due to balance deficits and decreased arousal state; max cues for hand placement and sequencing  Ambulation/Gait Ambulation/Gait Assistance: Not tested (comment) Stairs: No Wheelchair Mobility Wheelchair Mobility: No    Exercises  unable to follow commands for exercises    PT Diagnosis: Difficulty walking;Acute pain  PT Problem List: Decreased activity tolerance;Decreased strength;Decreased range of motion;Decreased balance;Decreased mobility;Decreased knowledge of use of DME;Decreased safety awareness;Pain PT Treatment Interventions: DME instruction;Gait training;Functional mobility training;Stair training;Therapeutic activities;Therapeutic exercise;Balance training;Neuromuscular re-education;Patient/family education     PT Goals(Current goals can be found in the care plan section) Acute Rehab PT Goals Patient Stated Goal: to go home PT Goal Formulation: With patient/family Time For Goal Achievement: 12/09/12 Potential to Achieve Goals: Fair  Visit Information  Last PT Received On: 12/05/2012 Assistance Needed: +2 History of Present Illness: s/p elective R THA       Prior Functioning  Home Living Family/patient expects to be discharged to:: Private residence Living Arrangements: Children Available Help at Discharge: Family;Available 24 hours/day Type of Home:  House Home  Access: Stairs to enter [employer] of Steps: 1 Entrance Stairs-Rails: None Home Layout: One level Home Equipment: Walker - 2 wheels;Bedside commode;Shower seat;Grab bars - toilet;Grab bars - tub/shower;Hand held shower head;Other (comment) (has a Sports administrator ) Additional Comments: handicap acessible bathroom  Prior Function Level of Independence: Independent with assistive device(s) Comments: patient's daughter is disabled so decreased physical assistance available. Communication Communication: No difficulties    Cognition  Cognition Arousal/Alertness: Lethargic;Suspect due to medications Behavior During Therapy: Flat affect Overall Cognitive Status: Difficult to assess Difficult to assess due to: Level of arousal    Extremity/Trunk Assessment Upper Extremity Assessment Upper Extremity Assessment: Defer to OT evaluation Lower Extremity Assessment Lower Extremity Assessment: RLE deficits/detail;Difficult to assess due to impaired cognition RLE Deficits / Details: pt with cues was able to perform R LAQ; grossly 3/5; unable to fully assess strength due to decreased arousal state  Cervical / Trunk Assessment Cervical / Trunk Assessment: Kyphotic   Balance Balance Balance Assessed: Yes Static Sitting Balance Static Sitting - Balance Support: Bilateral upper extremity supported;Feet unsupported Static Sitting - Level of Assistance: 4: Min assist;5: Stand by assistance Static Sitting - Comment/# of Minutes: initally pt was min (A) to maintain upright position at EOB; with tactile cues pt was able to progress to SBA; tolerated sitting EOB ~5 min; pt became more alert at EOB and could respond minimally to questions; pt agreeable to transfer to chair  End of Session PT - End of Session Equipment Utilized During Treatment: Gait belt;Oxygen Activity Tolerance: Patient limited by lethargy;Treatment limited secondary to medical complications (Comment) Patient left: in chair;with call  bell/phone within reach;with family/visitor present Nurse Communication: Mobility status;Other (comment) (pt coughing and demo labored breathing O2 at 989%)  GP     Shelva Majestic [place], [place] [phone] , 2:45 PM

## 2012-12-03 ENCOUNTER — Inpatient Hospital Stay (HOSPITAL_COMMUNITY): Payer: Medicare Other

## 2012-12-03 ENCOUNTER — Other Ambulatory Visit: Payer: Self-pay

## 2012-12-03 DIAGNOSIS — G9340 Encephalopathy, unspecified: Secondary | ICD-10-CM

## 2012-12-03 DIAGNOSIS — E875 Hyperkalemia: Secondary | ICD-10-CM

## 2012-12-03 DIAGNOSIS — R0789 Other chest pain: Secondary | ICD-10-CM

## 2012-12-03 DIAGNOSIS — N179 Acute kidney failure, unspecified: Secondary | ICD-10-CM

## 2012-12-03 DIAGNOSIS — M169 Osteoarthritis of hip, unspecified: Secondary | ICD-10-CM

## 2012-12-03 DIAGNOSIS — N189 Chronic kidney disease, unspecified: Secondary | ICD-10-CM

## 2012-12-03 LAB — TROPONIN I: Troponin I: <0.3 ng/mL (ref <0.30)

## 2012-12-03 LAB — URINALYSIS W MICROSCOPIC + REFLEX CULTURE
Hgb urine dipstick: NEGATIVE
Specific Gravity, Urine: 1.025 (ref 1.005–1.030)
pH: 5 (ref 5.0–8.0)

## 2012-12-03 LAB — PROCALCITONIN: Procalcitonin: 5.19 ng/mL

## 2012-12-03 LAB — POCT I-STAT 4, (NA,K, GLUC, HGB,HCT)
Glucose, Bld: 159 mg/dL — ABNORMAL HIGH (ref 70–99)
HCT: 26 % — ABNORMAL LOW (ref 36.0–46.0)
Potassium: 3.5 mEq/L (ref 3.5–5.1)
Sodium: 138 mEq/L (ref 135–145)

## 2012-12-03 LAB — BASIC METABOLIC PANEL
BUN: 47 mg/dL — ABNORMAL HIGH (ref 6–23)
Calcium: 8 mg/dL — ABNORMAL LOW (ref 8.4–10.5)
GFR calc Af Amer: 19 mL/min — ABNORMAL LOW (ref >90)
GFR calc non Af Amer: 18 mL/min — ABNORMAL LOW (ref >90)
GFR calc non Af Amer: 16 mL/min — ABNORMAL LOW (ref >90)
Glucose, Bld: 143 mg/dL — ABNORMAL HIGH (ref 70–99)
Glucose, Bld: 87 mg/dL (ref 70–99)
Potassium: 5.2 mEq/L — ABNORMAL HIGH (ref 3.5–5.1)
Potassium: 4.8 mEq/L (ref 3.5–5.1)
Sodium: 137 mEq/L (ref 135–145)
Sodium: 135 mEq/L (ref 135–145)

## 2012-12-03 LAB — CBC
Hemoglobin: 9.3 g/dL — ABNORMAL LOW (ref 12.0–15.0)
Hemoglobin: 7.7 g/dL — ABNORMAL LOW (ref 12.0–15.0)
MCH: 28.2 pg (ref 26.0–34.0)
MCHC: 33.3 g/dL (ref 30.0–36.0)
MCHC: 33.8 g/dL (ref 30.0–36.0)
Platelets: 237 10*3/uL (ref 150–400)
RBC: 3.33 MIL/uL — ABNORMAL LOW (ref 3.87–5.11)
RDW: 15.3 % (ref 11.5–15.5)

## 2012-12-03 LAB — HEPATIC FUNCTION PANEL
ALT: 119 U/L — ABNORMAL HIGH (ref 0–35)
AST: 116 U/L — ABNORMAL HIGH (ref 0–37)
Alkaline Phosphatase: 60 U/L (ref 39–117)
Bilirubin, Direct: 0.2 mg/dL (ref 0.0–0.3)
Indirect Bilirubin: 0.3 mg/dL (ref 0.3–0.9)
Total Bilirubin: 0.5 mg/dL (ref 0.3–1.2)

## 2012-12-03 LAB — SODIUM, URINE, RANDOM: Sodium, Ur: 11 mEq/L

## 2012-12-03 MED ORDER — SODIUM CHLORIDE 0.9 % IV SOLN
250.0000 mg | Freq: Four times a day (QID) | INTRAVENOUS | Status: DC
  Administered 2012-12-03 – 2012-12-04 (×4): 250 mg via INTRAVENOUS
  Filled 2012-12-03 (×7): qty 250

## 2012-12-03 MED ORDER — PROMETHAZINE HCL 25 MG/ML IJ SOLN
12.5000 mg | Freq: Once | INTRAMUSCULAR | Status: AC
  Administered 2012-12-03: 12.5 mg via INTRAVENOUS
  Filled 2012-12-03: qty 1

## 2012-12-03 MED ORDER — HYDROMORPHONE HCL PF 1 MG/ML IJ SOLN: 1.0000 mg | INTRAMUSCULAR | Status: DC | PRN

## 2012-12-03 MED ORDER — HYDROMORPHONE HCL PF 1 MG/ML IJ SOLN
0.5000 mg | INTRAMUSCULAR | Status: DC | PRN
  Administered 2012-12-04 (×3): 0.5 mg via INTRAVENOUS
  Filled 2012-12-03 (×4): qty 1

## 2012-12-03 MED ORDER — SODIUM CHLORIDE 0.9 % IV SOLN
INTRAVENOUS | Status: DC
  Administered 2012-12-03 – 2012-12-04 (×3): via INTRAVENOUS

## 2012-12-03 MED ORDER — SODIUM CHLORIDE 0.9 % IV SOLN
INTRAVENOUS | Status: DC
  Administered 2012-12-03: 11:00:00 via INTRAVENOUS

## 2012-12-03 MED ORDER — VANCOMYCIN HCL IN DEXTROSE 750-5 MG/150ML-% IV SOLN
750.0000 mg | INTRAVENOUS | Status: DC
  Administered 2012-12-03: 750 mg via INTRAVENOUS
  Filled 2012-12-03 (×3): qty 150

## 2012-12-03 MED ORDER — BISACODYL 10 MG RE SUPP
10.0000 mg | Freq: Once | RECTAL | Status: AC
  Administered 2012-12-03: 10 mg via RECTAL

## 2012-12-03 MED ORDER — LACTATED RINGERS IV SOLN
INTRAVENOUS | Status: DC
  Administered 2012-12-03: 09:00:00 via INTRAVENOUS

## 2012-12-03 MED ORDER — SODIUM CHLORIDE 0.9 % IV BOLUS (SEPSIS)
1000.0000 mL | Freq: Once | INTRAVENOUS | Status: AC
  Administered 2012-12-03: 1000 mL via INTRAVENOUS

## 2012-12-03 NOTE — Progress Notes (Signed)
Physical Therapy Treatment Patient Details Name: Natalie Kent MRN: [id number] DOB: 08-03-1930 Today's Date: 12/03/2012 Time: 7829-5621 PT Time Calculation (min): 23 min  PT Assessment / Plan / Recommendation  PT Comments   Pt's mobility limited at this time due to abdominal pain & lethargy.  If she does not progress d/c plans may need to be updated to SNF.     Follow Up Recommendations  Home health PT;Supervision/Assistance - 24 hour     Does the patient have the potential to tolerate intense rehabilitation     Barriers to Discharge        Equipment Recommendations       Recommendations for Other Services OT consult  Frequency 7X/week   Progress towards PT Goals Progress towards PT goals: Not progressing toward goals - comment (due to lethargy)  Plan      Precautions / Restrictions Precautions Precautions: Fall;Anterior Hip Precaution Comments: MRSA Restrictions Weight Bearing Restrictions: Yes RLE Weight Bearing: Weight bearing as tolerated   Pertinent Vitals/Pain Denies hip pain but reports abdominal pain.  Pt unable to rate on pain scale     Mobility  Bed Mobility Bed Mobility: Not assessed Details for Bed Mobility Assistance: Pt up in recliner upon arrival Transfers Transfers: Sit to Stand;Stand to Sit Sit to Stand: 1: +2 Total assist;With upper extremity assist;With armrests;From chair/3-in-1 Sit to Stand: Patient Percentage: 50% Stand to Sit: 1: +2 Total assist;Without upper extremity assist;To chair/3-in-1 Stand to Sit: Patient Percentage: 50% Details for Transfer Assistance: max cues for hand placement & technique.  Pt  with minimal to no assist with scooting hips to edge of chair to prepare for transfer.   (A) to achieve standing, balance, & controlled descent.  Pt moaning out in pain-- denies hip pain but states pain is from abdomen area.   Ambulation/Gait Ambulation/Gait Assistance: Not tested (comment) Stairs: No Wheelchair Mobility Wheelchair Mobility:  No      PT Goals (current goals can now be found in the care plan section) Acute Rehab PT Goals Patient Stated Goal: Unable due to lethargy and cogntive status PT Goal Formulation: With patient/family Time For Goal Achievement: 12/09/12 Potential to Achieve Goals: Fair  Visit Information  Last PT Received On: 12/03/12 Assistance Needed: +2 History of Present Illness: s/p elective R THA anterior approach    Subjective Data  Patient Stated Goal: Unable due to lethargy and cogntive status   Cognition  Cognition Arousal/Alertness: Lethargic;Suspect due to medications Behavior During Therapy: Flat affect Overall Cognitive Status: Difficult to assess Difficult to assess due to: Level of arousal    Balance  Static Sitting Balance Static Sitting - Balance Support: Feet supported Static Sitting - Level of Assistance: 1: +1 Total assist (Pt=0%) Static Sitting - Comment/# of Minutes: Pt required total assist for trunk support while sitting in chair without back support.    End of Session PT - End of Session Equipment Utilized During Treatment: Gait belt;Oxygen Activity Tolerance: Patient limited by lethargy Patient left: in chair;with call bell/phone within reach;with family/visitor present Nurse Communication: Mobility status   GP     Lara Mulch 12/03/2012, 9:46 AM    Verdell Face, PTA [phone] 12/03/2012

## 2012-12-03 NOTE — Progress Notes (Signed)
Patient's family is not interested in SNF at this time. Patient's daughter reported that she would discuss SNF placement with her siblings. SW will check with family in the am.  Sabino Niemann, MSW, [phone]

## 2012-12-03 NOTE — Progress Notes (Signed)
Follow up on patient from last night call - now with worsening abdominal pain, decreasing BP, and very little urine output today - patient with foley.  Diffuse abdominal pain - moaning and saying I can't take this anymore - family present and report increasing pain throughout day.  Tender left side with palpatation.  ST 110 - RR 22 BP 92/52 manual - foley patent cloudy amber urine.  Dr. Arbutus Leas paged with update - to bedside.  Labs and xrays ordered - to transfer to SDU.  NS bolus started.  BP responding to fluid - manual 108/52.  Rectal temp 101.1.  Handoff to [employer].

## 2012-12-03 NOTE — Progress Notes (Addendum)
HPI: [name] is a 79 yo WF with PMHx of multiple strokes with stenting of cranial arteries, remote DVT, HTN, asthma, PVD, and sinus arrythmia. Pt admitted to orthopaedics for functional disability/arthritis of right hip and underwent a total right hip arthroplasty on 12/21/2012 by Dr. Yisroel Ramming. Since surgery, she has been lethargic, hypotensive, and developed AKI with hx CKD III. We were asked to see in consult today (see Dr. Don Perking note). Suspicion for sepsis and pt was started on Vanc and Primaxin today. Pt had some chest pain and EKG without acute changes and so far, troponins neg. Given IVF for AKI. Ammonia level normal in re: encephalopathy. CT heada neg. Some hyperkalemia without arrythmias and this is improving with fluid. + abd pain, worsening, and CT abd neg except for severe constipation. RN paged this NP secondary to hypotension and pt not "looking well."  NP to beside for assessment.  S: Per RN, pt lethargic, pale, slight increased WOB, normal O2 sats on 2L O2 per [place] (never desatted), minimal urine output, minimal po intake and continued abd pain in spite of pain meds. And, IV Dilaudid is given with caution secondary to hypotension. Pt admits to SOB, abd pain and feeling unwell. No chest pain at present. Family says she has looked poorly since surgery and worse today. O: Very poor appearing elderly WF lying in bed. Appears somewhat toxic but in no acute distress. She is lethargic. Arouses only for short period of time for questions and has to be stimulated to stay awake long enough to answer. She is oriented. Speech is clear and appropriate when conversating. Skin is pale, non diaphoretic. Card: S1S2 with SEM 4/6 heard over the right and left sternal border. No JVD. Resp effort is minimally increased with RR in high 20s. O2 sats normal. Lungs slightly diminished without congestion noted. BS +. Abd distended. Diffusely tender. No guarding. MOE x 4. No Jelan Batterton edema noted. No focal neuro deficits noted.  A/P:  1. Hypotension-her septic markers were positive today and she was started on empiric Vanc and Primaxin. We have had some BP response with 1000cc NS bolus. Still with minimal UO. Cultures have been sent. Stat CBC ordered now.  2. AKI on CKD-IVF. BMP now. See below. 3. Lethargy-encephalopathy vs AKI vs likely sepsis. Ammonia level normal.  4. SOB/chest pain-redid EKG and is without acute change. Trop x 2 neg with 3rd one pending. Hgb fallen to 7.7. Considered PE but pt without desaturation or chest pain at present. Hx DVT and PVD/stroke, on Plavix. Will discuss with Dr. Conley Rolls.  5. Murmur-family says is new and no one has ever said pt had CHF. 2D Echo in am. (2006 echo on chart with no abnormalities). Concern with IVF/boluses that she may have some CHF. Cardiomegaly on CXR. Watch for fluid overload. Recheck CXR.  6. Hx strokes with intracranial artery stenting-stable, see no acute neuro changes except lethargy and CT head neg. On Plavix.  7. S/p right hip total arthroplasty 7/8. Anemia-TF 1 U PRBCs with H/H after.  8. abd pain secondary to constipation-pt too lethargic for po's at present.  Have discussed case with Dr. Conley Rolls who will also see pt. May need to have PCCM formally consult if pt not improving tonight. Rest of plan per orders.  Jimmye Norman, NP [facility]   Agree with above.  She is easily arousable and answered questions appropriately.  Moves all 4 ex with fluent speech. She likely is pre-renal.  Agree with PRBC and IVF bolus.  Avoid sedatives as much as possible.  Murmur could be from anemia.  Will follow.  Thanks.

## 2012-12-03 NOTE — Progress Notes (Signed)
Patient has been experiencing low b/p throughout the day. Patient continues to have low b/p also with little urine output. Patient c/o mid sternal pain as well as low abdominal pain. Patient given Maalox to relieve possible indigestion. EKG also done at this time, looks normal. Notified MD on call for results of CBC and CMP. BUN and Creatinine elevated. Was concerned with giving Vancomycin with elevated BUN and creatinine. proceeded with antibiotic per PA [facility]. Continued to monitor b/p every hour. Still some fluctuation in b/p but patient is still alert and oriented.   Patient cries out in pain and moans.  Complaining of lower abdominal pain. Bladder scan done, 37 ml seen. Total of 90 ml of urine thus far this shift. Will continue to monitor. Notified PA  again concerning patient's moaning and complain of abdominal pain and nausea. Orders given for phenergan 12.5 mg once and KUB abdominal view. Will continue to monitor. Patient's MD will be notified by Jason Coop, PA concerning the patient's progress throughout the night. P

## 2012-12-03 NOTE — Progress Notes (Signed)
ANTIBIOTIC CONSULT NOTE - INITIAL  Pharmacy Consult for Vancomycin/Primaxin Indication: SIRS/UTI  Allergies  Allergen Reactions  . Solarcaine Aloe Extra (Lidocaine Hcl)   . Septra (Bactrim)   . Cephalexin Rash    Patient Measurements:   Wt: 76.3 kg  Vital Signs: Temp: 98.8 F (37.1 C) (07/09 1455) Temp src: Oral (07/09 1455) BP: 92/58 mmHg (07/09 1455) Pulse Rate: 100 (07/09 1455) Intake/Output from previous day: 07/08 0701 - 07/09 0700 In: 3343.8 [P.O.:350; I.V.:1843.8; Blood:700; IV Piggyback:450] Out: 1415 [Urine:165; Blood:1250] Intake/Output from this shift:    Labs:  Recent Labs  11/28/2012 0922 12/10/2012 2035 12/03/12 0540 12/03/12 1116  WBC  --  16.8* 11.8*  --   HGB 8.8* 8.6* 9.3*  --   PLT  --  223 237  --   LABCREA  --   --   --  169.96  CREATININE  --  1.88* 2.42*  --    The CrCl is unknown because both a height and weight (above a minimum accepted value) are required for this calculation. No results found for this basename: VANCOTROUGH, Leodis Binet, VANCORANDOM, GENTTROUGH, GENTPEAK, GENTRANDOM, TOBRATROUGH, TOBRAPEAK, TOBRARND, AMIKACINPEAK, AMIKACINTROU, AMIKACIN,  in the last 72 hours   Microbiology: Recent Results (from the past 720 hour(s))  SURGICAL PCR SCREEN     Status: Abnormal   Collection Time    11/25/12  2:37 PM      Result Value Range Status   MRSA, PCR POSITIVE (*) NEGATIVE Final   Staphylococcus aureus POSITIVE (*) NEGATIVE Final   Comment:            The Xpert SA Assay (FDA     approved for NASAL specimens     in patients over 38 years of age),     is one component of     a comprehensive surveillance     program.  Test performance has     been validated by [employer] for patients greater     than or equal to 44 year old.     It is not intended     to diagnose infection nor to     guide or monitor treatment.  URINE CULTURE     Status: None   Collection Time    11/25/12  2:37 PM      Result Value Range Status   Specimen Description URINE, CLEAN CATCH   Final   Special Requests NONE   Final   Culture  Setup Time 11/25/2012 15:39   Final   Colony Count 80,000 COLONIES/ML   Final   Culture     Final   Value: GROUP B STREP(S.AGALACTIAE)ISOLATED     Note: TESTING AGAINST S. AGALACTIAE NOT ROUTINELY PERFORMED DUE TO PREDICTABILITY OF AMP/PEN/VAN SUSCEPTIBILITY.   Report Status 11/26/2012 FINAL   Final    Medical History: Past Medical History  Diagnosis Date  . DVT (deep venous thrombosis)   . Asthma   . Arthritis   . Hypertension   . Right rib fracture   . Fibromyalgia   . Mental disorder   . Anxiety   . Depression   . Pneumonia     hx  . Peripheral vascular disease 70's    groin blood clot  . GERD (gastroesophageal reflux disease)   . H/O hiatal hernia   . Anemia     hx  . Dysrhythmia     "irregular" heart rate --- history of sinus arrhythmia  . Stroke 03/2005    Medications:  Scheduled:  . aspirin EC  325 mg Oral BID  . cholecalciferol  1,000 Units Oral Daily  . clopidogrel  75 mg Oral QODAY  . [START ON 01/01/2013] cyanocobalamin  1,000 mcg Intramuscular Q30 days  . diltiazem  240 mg Oral Daily  . docusate sodium  100 mg Oral BID  . ferrous sulfate  325 mg Oral Q breakfast  . mometasone-formoterol  2 puff Inhalation BID  . montelukast  10 mg Oral QHS  . pantoprazole  40 mg Oral Daily  . tiotropium  18 mcg Inhalation Daily  . venlafaxine XR  150 mg Oral q morning - 10a   Assessment: 79 y/o F who had R-THA on 7/8. Since surgery has became more somnolent. WBC 11.8, appears to be in ARF with Scr 2.42<1.88 (baseline around 1.1 looking at previous labs). Tmax 98.8. Fairly inconclusive U/A.   Had Vancomycin 1000 mg IV on 7/8 at 0742 and 2222 for pre/post op doses  7/9 Blood X 2>> 7/9 Urine>>  Goal of Therapy:  Vancomycin trough level 15-20 mcg/ml  Plan:  -Vancomycin 750mg  IV q24h starting tonight at 2200 (may need dose increase if renal function improves) -Primaxin 250mg   IV q6h  -Trend WBC, temp, renal function -F/U urine culture -De-escalate soon if cultures are negative   Thank you for allowing me to take part in this patient's care,  Abran Duke, PharmD Clinical Pharmacist Phone: [phone] Pager: [phone] 12/03/2012 4:41 PM

## 2012-12-03 NOTE — Evaluation (Signed)
Occupational Therapy Evaluation Patient Details Name: Natalie Kent MRN: [id number] DOB: 1930-10-14 Today's Date: 12/03/2012 Time: 1324-4010 OT Time Calculation (min): 22 min  OT Assessment / Plan / Recommendation History of present illness s/p elective R THA anterior approach   Clinical Impression   Pt really limited today due to abdominal pain and lethargy. At current level of function pt would benefit from OT at [facility].    OT Assessment  Patient needs continued OT Services    Follow Up Recommendations  SNF       Equipment Recommendations  None recommended by OT       Frequency  Min 2X/week    Precautions / Restrictions Precautions Precautions: Fall;Anterior Hip Precaution Comments: MRSA Restrictions Weight Bearing Restrictions: Yes RLE Weight Bearing: Weight bearing as tolerated   Pertinent Vitals/Pain Pt reports abdominal pain, but unable to state number on scale from 1-10    ADL  Equipment Used: Gait belt;Rolling walker Transfers/Ambulation Related to ADLs: Total A +2 (pt=50%) sit to stand and stand to sit ADL Comments: Pt currently total A for BADLs due to cognitive status    OT Diagnosis: Generalized weakness;Cognitive deficits;Acute pain  OT Problem List: Decreased activity tolerance;Impaired balance (sitting and/or standing);Decreased cognition;Cardiopulmonary status limiting activity;Impaired UE functional use;Pain OT Treatment Interventions: Self-care/ADL training;Patient/family education;Balance training;Therapeutic activities;DME and/or AE instruction;Cognitive remediation/compensation   OT Goals(Current goals can be found in the care plan section) Acute Rehab OT Goals Patient Stated Goal: Unable due to lethargy and cogntive status OT Goal Formulation: With family Time For Goal Achievement: 12/17/12 Potential to Achieve Goals: Good  Visit Information  Last OT Received On: 12/03/12 Assistance Needed: +2 History of Present Illness: s/p elective R THA  anterior approach       Prior Functioning     Home Living Family/patient expects to be discharged to:: Private residence Living Arrangements: Children Available Help at Discharge: Family;Available 24 hours/day Type of Home: House Home Access: Stairs to enter [employer] of Steps: 1 Entrance Stairs-Rails: None Home Layout: One level Home Equipment: Walker - 2 wheels;Bedside commode;Shower seat;Grab bars - toilet;Grab bars - tub/shower;Hand held shower head;Other (comment) Additional Comments: handicap acessible bathroom  Prior Function Level of Independence: Independent with assistive device(s) Comments: patient's daughter is disabled so decreased physical assistance available. Communication Communication: No difficulties Dominant Hand: Right         Vision/Perception Vision - History Baseline Vision: Wears glasses all the time Patient Visual Report:  (Unable to assess due to cognitive status)   Cognition  Cognition Arousal/Alertness: Lethargic;Suspect due to medications Behavior During Therapy: Flat affect Overall Cognitive Status: Difficult to assess Difficult to assess due to: Level of arousal    Extremity/Trunk Assessment Upper Extremity Assessment Upper Extremity Assessment: Difficult to assess due to impaired cognition     Mobility Bed Mobility Details for Bed Mobility Assistance: Pt up in recliner upon arrival Transfers Transfers: Sit to Stand;Stand to Sit Sit to Stand: 1: +2 Total assist;Without upper extremity assist;From chair/3-in-1 Sit to Stand: Patient Percentage: 50% Stand to Sit: 1: +2 Total assist;Without upper extremity assist;To chair/3-in-1 Stand to Sit: Patient Percentage: 50%        Balance Static Sitting Balance Static Sitting - Balance Support: Feet unsupported Static Sitting - Level of Assistance: 1: +1 Total assist (Pt=0%)   End of Session OT - End of Session Equipment Utilized During Treatment: Gait belt Activity  Tolerance: Patient limited by lethargy;Patient limited by pain Patient left: in chair;with family/visitor present  Evette Georges [phone] 12/03/2012, 9:21 AM

## 2012-12-03 NOTE — Progress Notes (Addendum)
Subjective: 1 Day Post-Op Procedure(s) (LRB): TOTAL HIP ARTHROPLASTY ANTERIOR APPROACH (Right)  Activity level:  OOB to chair Diet tolerance:  poor Voiding:  foley Patient reports pain as moderate.    Objective: Vital signs in last 24 hours: Temp:  [97.3 F (36.3 C)-98.7 F (37.1 C)] 98 F (36.7 C) (07/09 0546) Pulse Rate:  [61-90] 90 (07/09 0546) Resp:  [10-18] 16 (07/09 0546) BP: (70-141)/(39-77) 118/60 mmHg (07/09 0546) SpO2:  [90 %-100 %] 98 % (07/09 0546)  Labs:  Recent Labs  12-05-12 2035 12/03/12 0540  HGB 8.6* 9.3*    Recent Labs  12-05-2012 2035 12/03/12 0540  WBC 16.8* 11.8*  RBC 3.08* 3.33*  HCT 25.8* 27.9*  PLT 223 237    Recent Labs  December 05, 2012 2035 12/03/12 0540  NA 135 137  K 4.7 5.2*  CL 104 105  CO2 23 23  BUN 30* 39*  CREATININE 1.88* 2.42*  GLUCOSE 132* 143*  CALCIUM 8.2* 9.3   No results found for this basename: LABPT, INR,  in the last 72 hours  Physical Exam:  LL equal, calves soft Abdomen painful to palpation Oriented to place and person  Sleepy Good pulses and S and M intact in legs   Assessment/Plan:  1 Day Post-Op Procedure(s) (LRB): TOTAL HIP ARTHROPLASTY ANTERIOR APPROACH (Right) Advance diet Up with therapy DC potassium with elevated level today  Will consult medical team for assistance.  Sure she is probably a bit oversedated but still with poor output despite fluids.  Dr Renne Crigler is LMD    Natalie Kent 12/03/2012, 7:51 AM

## 2012-12-03 NOTE — Consult Note (Signed)
[facility] Medical Consultation  NALINA YEATMAN ZOX:[id number] DOB: 03-11-1931 DOA: 12/13/2012 PCP: Londell Moh, MD   Requesting physician: Dr. Jerl Santos Date of consultation: 12/03/2012 Reason for consultation: Mental status change, decreased or output  Impression/Recommendations Acute encephalopathy -This is multifactorial including AKI, opioids, possible UTI, medications (losartan, HCTZ, meloxicam) -CT brain -check ammonia -Urinalysis with reflux to urine culture Acute on chronic renal failure/CKD stage III -May be hypovolemia versus medication induced -Discontinue losartan and HCTZ -Renal ultrasound -Discontinue lactate Ringers -Start normal saline -Check urine sodium, urine creatinine Hyperkalemia  -Mild -Place patient on telemetry -IV fluids -Discontinue losartan as discussed -Repeat EKG Abdominal pain -Likely due to constipation -Abdominal x-ray shows moderate to severe constipation -Duplex suppository -Continue Colace Chest discomfort -Cycle troponins -Repeat EKG -Chest x-ray  I will followup again tomorrow. Please contact me if I can be of assistance in the meanwhile. Thank you for this consultation.  Chief Complaint: Altered mental status  HPI:  79 year old female with history of hypertension, asthma, depression, remote DVT, presented to the hospital for elective right total hip arthroplasty which was performed on 12/16/2012 by Dr. Marcene Corning. Since the surgery, the patient has had increasing somnolence and also complained of chest discomfort abdominal pain today (06/05/2012). Currently, the patient is encephalopathic and is unable to provide any history. All of this history is obtained from speaking with Dr. Yisroel Ramming and pt's daughter at bedside. The patient has had decreasing urine output since admission to the hospital. Foley catheter was placed which revealed urine output of 1415cc last 24 hours. No reports of vomiting, diarrhea, respiratory  distress. At baseline, the patient is alert and oriented x3 and lives with her daughter. The patient was previously taking meloxicam prior to admission. In addition, she has continued on this and HCTZ since admission. She also received a dose of vancomycin on 12/09/2012 for preop prophylaxis.  Review of Systems:  Unobtainable secondary to patient's mental status   Past Medical History  Diagnosis Date  . DVT (deep venous thrombosis)   . Asthma   . Arthritis   . Hypertension   . Right rib fracture   . Fibromyalgia   . Mental disorder   . Anxiety   . Depression   . Pneumonia     hx  . Peripheral vascular disease 70's    groin blood clot  . GERD (gastroesophageal reflux disease)   . H/O hiatal hernia   . Anemia     hx  . Dysrhythmia     "irregular" heart rate --- history of sinus arrhythmia  . Stroke 03/2005   Past Surgical History  Procedure Laterality Date  . Abdominal hysterectomy    . Cholecystectomy    . Knee surgery Bilateral   . Endovascular stent insertion      left MCA stent 08/2005, 09/2006, intracranial angioplasty 01/2007  . Eye surgery Bilateral     cat  . Varicose vein surgery     Social History:  reports that she has never smoked. She has never used smokeless tobacco. She reports that she does not drink alcohol or use illicit drugs.  History reviewed. No pertinent family history.  Allergies  Allergen Reactions  . Solarcaine Aloe Extra (Lidocaine Hcl)   . Septra (Bactrim)   . Cephalexin Rash     Prior to Admission medications   Medication Sig Start Date End Date Taking? Authorizing Provider  aspirin EC 81 MG tablet Take 81 mg by mouth daily.   Yes Historical Provider, MD  cholecalciferol (VITAMIN D)  1000 UNITS tablet Take 1,000 Units by mouth daily.   Yes Historical Provider, MD  clopidogrel (PLAVIX) 75 MG tablet Take 75 mg by mouth every other day.    Yes Historical Provider, MD  cyanocobalamin (,VITAMIN B-12,) 1000 MCG/ML injection Inject 1,000 mcg  into the muscle every 30 (thirty) days.   Yes Historical Provider, MD  diltiazem (CARDIZEM CD) 240 MG 24 hr capsule Take 240 mg by mouth daily.     Yes Historical Provider, MD  Fluticasone-Salmeterol (ADVAIR) 500-50 MCG/DOSE AEPB Inhale 1 puff into the lungs every 12 (twelve) hours.   Yes Historical Provider, MD  HYDROcodone-acetaminophen (NORCO/VICODIN) 5-325 MG per tablet Take 1 tablet by mouth every 4 (four) hours as needed for pain.   Yes Historical Provider, MD  losartan-hydrochlorothiazide (HYZAAR) 100-25 MG per tablet Take 1 tablet by mouth daily.     Yes Historical Provider, MD  meloxicam (MOBIC) 15 MG tablet Take 15 mg by mouth daily.   Yes Historical Provider, MD  montelukast (SINGULAIR) 10 MG tablet Take 10 mg by mouth at bedtime.   Yes Historical Provider, MD  pantoprazole (PROTONIX) 40 MG tablet Take 40 mg by mouth daily.   Yes Historical Provider, MD  polyethylene glycol (MIRALAX / GLYCOLAX) packet Take 17 g by mouth daily as needed (for constipation).    Yes Historical Provider, MD  SPIRIVA HANDIHALER 18 MCG inhalation capsule Place 18 mcg into inhaler and inhale daily.  11/10/12  Yes Historical Provider, MD  trolamine salicylate (ASPERCREME) 10 % cream Apply 1 application topically as needed (for muscle pain).   Yes Historical Provider, MD  venlafaxine XR (EFFEXOR-XR) 150 MG 24 hr capsule Take 150 mg by mouth every morning.    Yes Historical Provider, MD  albuterol (PROVENTIL HFA;VENTOLIN HFA) 108 (90 BASE) MCG/ACT inhaler Inhale 2 puffs into the lungs every 6 (six) hours as needed for shortness of breath.     Historical Provider, MD    Physical Exam: Filed Vitals:   12/03/12 0400 12/03/12 0448 12/03/12 0546 12/03/12 0839  BP:  111/42 118/60   Pulse:   90   Temp:   98 F (36.7 C)   TempSrc:      Resp: 17  16   SpO2:   98% 97%   General:  Alert to name but confused, NAD, nontoxic,  Head/Eye: No conjunctival hemorrhage, no icterus, [place]/AT, No nystagmus ENT:  No icterus,  No  thrush,  no pharyngeal exudate Neck:  No masses, no lymphadenpathy, CV:  RRR, no rub, no gallop, no S3 Lung:  Basilar crackles, right greater left. No wheezes. Good air movement. Abdomen: soft/NT, +BS, nondistended, no peritoneal signs Ext: No cyanosis, No rashes, No petechiae, No lymphangitis, No edema Neuro: Patient raises arms and legs bilaterally antigravity. Withdraws to protopathic and epicritic stomach. No facial droop. Corneal reflex intact. Patient shrugged shoulders bilaterally. Pharynx rises symmetrically. No tongue deviation. Labs on Admission:  Basic Metabolic Panel:  Recent Labs Lab 2012-12-07 2035 12/03/12 0540  NA 135 137  K 4.7 5.2*  CL 104 105  CO2 23 23  GLUCOSE 132* 143*  BUN 30* 39*  CREATININE 1.88* 2.42*  CALCIUM 8.2* 9.3   Liver Function Tests: No results found for this basename: AST, ALT, ALKPHOS, BILITOT, PROT, ALBUMIN,  in the last 168 hours No results found for this basename: LIPASE, AMYLASE,  in the last 168 hours No results found for this basename: AMMONIA,  in the last 168 hours CBC:  Recent Labs Lab 12/07/12 2035 12/03/12  0540  WBC 16.8* 11.8*  NEUTROABS 14.0*  --   HGB 8.6* 9.3*  HCT 25.8* 27.9*  MCV 83.8 83.8  PLT 223 237   Cardiac Enzymes: No results found for this basename: CKTOTAL, CKMB, CKMBINDEX, TROPONINI,  in the last 168 hours BNP: No components found with this basename: POCBNP,  CBG: No results found for this basename: GLUCAP,  in the last 168 hours  Radiological Exams on Admission: Dg Hip Operative Right  December 27, 2012   *RADIOLOGY REPORT*  Clinical Data: Post anterior approach right total hip replacement  DG OPERATIVE RIGHT HIP  Comparison: None.  Findings:  Two anterior projection images of the lower pelvis and right hip are provided for review.  Images demonstrate the sequela of right sided total hip replacement.  Alignment appears near anatomic on the provided anterior projection.  No definite fracture.  No definite radiopaque  foreign body.  IMPRESSION: Post right total hip replacement without definite evidence of complication.   Original Report Authenticated By: Tacey Ruiz, MD   Dg Abd Portable 1v  12/03/2012   *RADIOLOGY REPORT*  Clinical Data: Right hip arthroplasty.  Abdominal pain  PORTABLE ABDOMEN - 1 VIEW  Comparison: 07/04/2012  Findings: Large amount of stool is present throughout the colon compatible with constipation.  No bowel obstruction.  Lumbar levoscoliosis.  Right hip replacement in satisfactory position.  Gallbladder clips in the right upper quadrant.  IMPRESSION: Moderate to severe constipation.  Negative for bowel obstruction.   Original Report Authenticated By: Janeece Riggers, M.D.    EKG: Independently reviewed. Sinus rhythm, no ST to T wave change  Time spent:  Quierra Silverio [facility] Pager [phone]  If 7PM-7AM, please contact night-coverage [email or web address] Password [facility] 12/03/2012, 10:42 AM

## 2012-12-03 NOTE — Progress Notes (Addendum)
I was called to see the patient this afternoon because of worsening abdominal pain, concerns for possible hypotension, and diaphoresis.  Automated systolic blood pressure 92. Initial troponin was negative. Initial EKG no ST-T wave change. Chest x-ray this morning negative for consolidation with bibasilar scarring. Abdominal x-ray this morning showed constipation. FeNa <1%  T101.1, HR 110- RR20- 98/48 (manual)  500 cc bolus of normal saline was given. BP improved to 108/52, CV:RRR, no rub Lung: basilar rales, no wheeze  Abd: soft/+BS, diffuse tender Ext no edema, no rash  Plan -Blood cultures x2 sets -Start patient on empiric imipenem and vancomycin -CBC, BMP -Lactic acid -Procalcitonin -Increase IV fluids to 100 cc per hour -Transfer patient to step down unit -CT abdomen pelvis without contrast -Patient's daughters were updated at the bedside -Renal ultrasound was canceled in view of full abdominal ultrasound -d/c diltiazem, losartan d/ced earlier today -Dr. Jerl Santos notified of changes  DTat

## 2012-12-03 NOTE — Progress Notes (Signed)
Progress reviewed and agree with change in dc plan; Van Clines, [place] [phone]

## 2012-12-03 NOTE — Progress Notes (Signed)
Physical Therapy Treatment Patient Details Name: Natalie Kent MRN: [id number] DOB: 09-20-1930 Today's Date: 12/03/2012 Time: 4098-1191 PT Time Calculation (min): 35 min  PT Assessment / Plan / Recommendation  PT Comments   Pt answering more questions this session but still very lethargic & mobility is still very limited.  At this time pt would strongly benefit from SNF unless progress is to be made.  D/c plans updated.    Follow Up Recommendations  SNF     Does the patient have the potential to tolerate intense rehabilitation     Barriers to Discharge        Equipment Recommendations       Recommendations for Other Services    Frequency 7X/week   Progress towards PT Goals Progress towards PT goals: Not progressing toward goals - comment  Plan Discharge plan needs to be updated    Precautions / Restrictions Precautions Precautions: Fall;Anterior Hip Precaution Comments: MRSA Restrictions Weight Bearing Restrictions: Yes RLE Weight Bearing: Weight bearing as tolerated   Pertinent Vitals/Pain Abdominal pain & Rt hip pain with movement.      Mobility  Bed Mobility Bed Mobility: Supine to Sit;Sitting - Scoot to Edge of Bed;Sit to Supine Supine to Sit: 1: +2 Total assist;HOB elevated Supine to Sit: Patient Percentage: 0% Sitting - Scoot to Edge of Bed: 1: +2 Total assist Sitting - Scoot to Edge of Bed: Patient Percentage: 0% Sit to Supine: 1: +2 Total assist;HOB flat Sit to Supine: Patient Percentage: 20% Details for Bed Mobility Assistance: Pt required assist for all components of transitional movements.  With sit>supine pt falling to side & requiring assistance to control/guide shoulders/trunk to supine position.   Transfers Transfers: Not assessed Ambulation/Gait Ambulation/Gait Assistance: Not tested (comment)    Exercises Total Joint Exercises Ankle Circles/Pumps: PROM;Both;10 reps Heel Slides: AAROM;Both;10 reps (PROM RLE) Hip ABduction/ADduction: AAROM;PROM;10  reps;Both (PROM RLE) Straight Leg Raises: PROM;Both;10 reps;AAROM (PROM RLE)     PT Goals (current goals can now be found in the care plan section) Acute Rehab PT Goals Time For Goal Achievement: 12/09/12 Potential to Achieve Goals: Fair  Visit Information  Last PT Received On: 12/03/12 Assistance Needed: +2 History of Present Illness: s/p elective R THA anterior approach    Subjective Data      Cognition  Cognition Arousal/Alertness: Lethargic Behavior During Therapy: Flat affect Overall Cognitive Status: Difficult to assess Difficult to assess due to: Level of arousal    Balance  Static Sitting Balance Static Sitting - Balance Support: Feet supported;Bilateral upper extremity supported Static Sitting - Level of Assistance: 1: +1 Total assist Static Sitting - Comment/# of Minutes: Pt with LOB posteriorly & to Lt side.  This therapsist was sitting to pt's Lt & pt leaning heavily into therapist.  At times pt was able to bring trunk to sitting midline but would immediately lean backwards & to Lt.    End of Session PT - End of Session Activity Tolerance: Patient limited by lethargy;Patient limited by pain Patient left: in bed;with call bell/phone within reach;with family/visitor present Nurse Communication: Mobility status   GP     Lara Mulch 12/03/2012, 2:40 PM   Verdell Face, PTA [phone] 12/03/2012

## 2012-12-04 ENCOUNTER — Inpatient Hospital Stay (HOSPITAL_COMMUNITY): Payer: Medicare Other

## 2012-12-04 ENCOUNTER — Inpatient Hospital Stay: Admit: 2012-12-04 | Payer: Self-pay | Admitting: Orthopaedic Surgery

## 2012-12-04 ENCOUNTER — Encounter (HOSPITAL_COMMUNITY): Payer: Self-pay | Admitting: Orthopaedic Surgery

## 2012-12-04 DIAGNOSIS — J9600 Acute respiratory failure, unspecified whether with hypoxia or hypercapnia: Secondary | ICD-10-CM

## 2012-12-04 DIAGNOSIS — N189 Chronic kidney disease, unspecified: Secondary | ICD-10-CM

## 2012-12-04 DIAGNOSIS — R109 Unspecified abdominal pain: Secondary | ICD-10-CM | POA: Diagnosis not present

## 2012-12-04 DIAGNOSIS — G9340 Encephalopathy, unspecified: Secondary | ICD-10-CM

## 2012-12-04 DIAGNOSIS — R6520 Severe sepsis without septic shock: Secondary | ICD-10-CM | POA: Diagnosis not present

## 2012-12-04 DIAGNOSIS — A419 Sepsis, unspecified organism: Secondary | ICD-10-CM

## 2012-12-04 DIAGNOSIS — N179 Acute kidney failure, unspecified: Secondary | ICD-10-CM

## 2012-12-04 DIAGNOSIS — I959 Hypotension, unspecified: Secondary | ICD-10-CM | POA: Clinically undetermined

## 2012-12-04 DIAGNOSIS — I369 Nonrheumatic tricuspid valve disorder, unspecified: Secondary | ICD-10-CM

## 2012-12-04 LAB — BASIC METABOLIC PANEL
BUN: 60 mg/dL — ABNORMAL HIGH (ref 6–23)
CO2: 22 mEq/L (ref 19–32)
Calcium: 8.3 mg/dL — ABNORMAL LOW (ref 8.4–10.5)
Chloride: 111 mEq/L (ref 96–112)
Creatinine, Ser: 2.69 mg/dL — ABNORMAL HIGH (ref 0.50–1.10)
GFR calc Af Amer: 19 mL/min — ABNORMAL LOW (ref >90)
GFR calc non Af Amer: 16 mL/min — ABNORMAL LOW (ref >90)
GFR calc non Af Amer: 16 mL/min — ABNORMAL LOW (ref >90)
Glucose, Bld: 99 mg/dL (ref 70–99)
Potassium: 4.9 mEq/L (ref 3.5–5.1)
Sodium: 141 mEq/L (ref 135–145)

## 2012-12-04 LAB — CBC
HCT: 23.4 % — ABNORMAL LOW (ref 36.0–46.0)
Hemoglobin: 8.1 g/dL — ABNORMAL LOW (ref 12.0–15.0)
MCH: 27.7 pg (ref 26.0–34.0)
MCHC: 33 g/dL (ref 30.0–36.0)
MCHC: 34.6 g/dL (ref 30.0–36.0)
MCV: 83.9 fL (ref 78.0–100.0)
Platelets: 178 10*3/uL (ref 150–400)
RBC: 2.78 MIL/uL — ABNORMAL LOW (ref 3.87–5.11)
WBC: 7.1 10*3/uL (ref 4.0–10.5)

## 2012-12-04 LAB — CARBOXYHEMOGLOBIN
Carboxyhemoglobin: 1.7 % — ABNORMAL HIGH (ref 0.5–1.5)
Methemoglobin: 0.7 % (ref 0.0–1.5)
O2 Saturation: 84.1 %

## 2012-12-04 LAB — POCT I-STAT 3, ART BLOOD GAS (G3+)
Acid-base deficit: 11 mmol/L — ABNORMAL HIGH (ref 0.0–2.0)
O2 Saturation: 91 %
O2 Saturation: 91 %
O2 Saturation: 96 %
TCO2: 17 mmol/L (ref 0–100)
TCO2: 18 mmol/L (ref 0–100)
pCO2 arterial: 31.7 mmHg — ABNORMAL LOW (ref 35.0–45.0)
pCO2 arterial: 33.5 mmHg — ABNORMAL LOW (ref 35.0–45.0)
pCO2 arterial: 44.5 mmHg (ref 35.0–45.0)
pO2, Arterial: 88 mmHg (ref 80.0–100.0)

## 2012-12-04 LAB — BLOOD GAS, ARTERIAL
Bicarbonate: 17.8 mEq/L — ABNORMAL LOW (ref 20.0–24.0)
O2 Saturation: 89.6 %
pCO2 arterial: 33.6 mmHg — ABNORMAL LOW (ref 35.0–45.0)
pO2, Arterial: 59.2 mmHg — ABNORMAL LOW (ref 80.0–100.0)

## 2012-12-04 LAB — TROPONIN I: Troponin I: <0.3 ng/mL (ref <0.30)

## 2012-12-04 LAB — LACTIC ACID, PLASMA: Lactic Acid, Venous: 2.4 mmol/L — ABNORMAL HIGH (ref 0.5–2.2)

## 2012-12-04 LAB — URINE CULTURE: Culture: NO GROWTH

## 2012-12-04 SURGERY — ARTHROPLASTY, HIP, TOTAL, ANTERIOR APPROACH
Anesthesia: Choice | Laterality: Right

## 2012-12-04 MED ORDER — MIDAZOLAM HCL 2 MG/2ML IJ SOLN
1.0000 mg | INTRAMUSCULAR | Status: DC | PRN
  Administered 2012-12-04 – 2012-12-05 (×4): 2 mg via INTRAVENOUS
  Administered 2012-12-05: 1 mg via INTRAVENOUS
  Administered 2012-12-07 – 2012-12-08 (×5): 2 mg via INTRAVENOUS
  Filled 2012-12-04 (×10): qty 2

## 2012-12-04 MED ORDER — SODIUM CHLORIDE 0.9 % IV BOLUS (SEPSIS)
250.0000 mL | Freq: Once | INTRAVENOUS | Status: AC
  Administered 2012-12-04: 250 mL via INTRAVENOUS

## 2012-12-04 MED ORDER — DOCUSATE SODIUM 100 MG PO CAPS
200.0000 mg | ORAL_CAPSULE | Freq: Two times a day (BID) | ORAL | Status: DC
  Filled 2012-12-04 (×2): qty 2

## 2012-12-04 MED ORDER — ETOMIDATE 2 MG/ML IV SOLN
INTRAVENOUS | Status: AC
  Filled 2012-12-04: qty 20

## 2012-12-04 MED ORDER — SODIUM CHLORIDE 0.9 % IV BOLUS (SEPSIS)
500.0000 mL | Freq: Once | INTRAVENOUS | Status: AC
  Administered 2012-12-04: 21:00:00 via INTRAVENOUS

## 2012-12-04 MED ORDER — SODIUM CHLORIDE 0.9 % IV SOLN
250.0000 mg | Freq: Two times a day (BID) | INTRAVENOUS | Status: DC
  Administered 2012-12-05: 250 mg via INTRAVENOUS
  Filled 2012-12-04 (×2): qty 250

## 2012-12-04 MED ORDER — NOREPINEPHRINE BITARTRATE 1 MG/ML IJ SOLN
2.0000 ug/min | Freq: Once | INTRAVENOUS | Status: AC
  Administered 2012-12-04: 5 ug/min via INTRAVENOUS
  Filled 2012-12-04: qty 16

## 2012-12-04 MED ORDER — SODIUM CHLORIDE 0.9 % IV SOLN: INTRAVENOUS | Status: DC

## 2012-12-04 MED ORDER — PHENYLEPHRINE HCL 10 MG/ML IJ SOLN
20.0000 ug/min | INTRAVENOUS | Status: DC
  Filled 2012-12-04 (×2): qty 1

## 2012-12-04 MED ORDER — MIDAZOLAM HCL 10 MG/2ML IJ SOLN: 2.0000 mg | Freq: Once | INTRAMUSCULAR | Status: AC

## 2012-12-04 MED ORDER — NOREPINEPHRINE BITARTRATE 1 MG/ML IJ SOLN
2.0000 ug/min | INTRAMUSCULAR | Status: DC
  Filled 2012-12-04 (×2): qty 16

## 2012-12-04 MED ORDER — PHENYLEPHRINE HCL 10 MG/ML IJ SOLN: 30.0000 ug/min | INTRAVENOUS | Status: DC

## 2012-12-04 MED ORDER — SUCCINYLCHOLINE CHLORIDE 20 MG/ML IJ SOLN
INTRAMUSCULAR | Status: AC
  Filled 2012-12-04: qty 1

## 2012-12-04 MED ORDER — NOREPINEPHRINE BITARTRATE 1 MG/ML IJ SOLN
2.0000 ug/min | INTRAVENOUS | Status: DC
  Filled 2012-12-04: qty 4

## 2012-12-04 MED ORDER — NOREPINEPHRINE BITARTRATE 1 MG/ML IJ SOLN
2.0000 ug/min | Freq: Once | INTRAVENOUS | Status: DC
  Filled 2012-12-04: qty 16

## 2012-12-04 MED ORDER — SODIUM BICARBONATE 8.4 % IV SOLN
INTRAVENOUS | Status: AC
  Administered 2012-12-04: 50 meq
  Filled 2012-12-04: qty 100

## 2012-12-04 MED ORDER — VASOPRESSIN 20 UNIT/ML IJ SOLN
0.0300 [IU]/min | INTRAVENOUS | Status: DC
  Administered 2012-12-04: 0.03 [IU]/min via INTRAVENOUS
  Filled 2012-12-04: qty 2.5

## 2012-12-04 MED ORDER — ROCURONIUM BROMIDE 50 MG/5ML IV SOLN
INTRAVENOUS | Status: AC
  Administered 2012-12-04: 70 mg
  Filled 2012-12-04: qty 2

## 2012-12-04 MED ORDER — SODIUM CHLORIDE 0.9 % IV BOLUS (SEPSIS)
500.0000 mL | Freq: Once | INTRAVENOUS | Status: AC
  Administered 2012-12-04: 500 mL via INTRAVENOUS

## 2012-12-04 MED ORDER — SODIUM CHLORIDE 0.9 % IV BOLUS (SEPSIS)
1000.0000 mL | INTRAVENOUS | Status: DC | PRN
  Administered 2012-12-04: 1000 mL via INTRAVENOUS

## 2012-12-04 MED ORDER — ETOMIDATE 2 MG/ML IV SOLN
20.0000 mg | Freq: Once | INTRAVENOUS | Status: AC
  Administered 2012-12-04: 20 mg via INTRAVENOUS

## 2012-12-04 MED ORDER — MIDAZOLAM HCL 2 MG/2ML IJ SOLN
INTRAMUSCULAR | Status: AC
  Administered 2012-12-04: 2 mg
  Filled 2012-12-04: qty 4

## 2012-12-04 MED ORDER — FLEET ENEMA 7-19 GM/118ML RE ENEM
1.0000 | ENEMA | Freq: Three times a day (TID) | RECTAL | Status: AC
  Administered 2012-12-04 (×2): 1 via RECTAL
  Administered 2012-12-05: 12:00:00 via RECTAL
  Filled 2012-12-04 (×6): qty 1

## 2012-12-04 MED ORDER — VANCOMYCIN HCL IN DEXTROSE 1-5 GM/200ML-% IV SOLN
1000.0000 mg | INTRAVENOUS | Status: DC
  Filled 2012-12-04: qty 200

## 2012-12-04 MED ORDER — PANTOPRAZOLE SODIUM 40 MG IV SOLR
40.0000 mg | Freq: Every day | INTRAVENOUS | Status: DC
  Administered 2012-12-04 – 2012-12-11 (×8): 40 mg via INTRAVENOUS
  Filled 2012-12-04 (×10): qty 40

## 2012-12-04 MED ORDER — FENTANYL CITRATE 0.05 MG/ML IJ SOLN
25.0000 ug | INTRAMUSCULAR | Status: DC | PRN
  Administered 2012-12-05 (×4): 50 ug via INTRAVENOUS
  Filled 2012-12-04 (×4): qty 2

## 2012-12-04 MED ORDER — HYDROCORTISONE SOD SUCCINATE 100 MG IJ SOLR
50.0000 mg | Freq: Four times a day (QID) | INTRAMUSCULAR | Status: DC
  Administered 2012-12-04 – 2012-12-05 (×3): 50 mg via INTRAVENOUS
  Administered 2012-12-05: 12:00:00 via INTRAVENOUS
  Administered 2012-12-05 – 2012-12-06 (×3): 50 mg via INTRAVENOUS
  Filled 2012-12-04 (×11): qty 1

## 2012-12-04 MED ORDER — FENTANYL CITRATE 0.05 MG/ML IJ SOLN
INTRAMUSCULAR | Status: AC
  Filled 2012-12-04: qty 2

## 2012-12-04 MED ORDER — LIDOCAINE HCL (CARDIAC) 20 MG/ML IV SOLN
INTRAVENOUS | Status: AC
  Filled 2012-12-04: qty 5

## 2012-12-04 MED ORDER — DEXTROSE-NACL 5-0.9 % IV SOLN
INTRAVENOUS | Status: DC
  Administered 2012-12-05: 50 mL/h via INTRAVENOUS

## 2012-12-04 NOTE — Consult Note (Signed)
PULMONARY  / CRITICAL CARE MEDICINE  Name: Natalie Kent MRN: [id number] DOB: Sep 21, 1930    ADMISSION DATE:  12/25/2012 CONSULTATION DATE:  7/10  REFERRING MD :  Ortho  PRIMARY SERVICE: PCCM  CHIEF COMPLAINT:  sepsis  BRIEF PATIENT DESCRIPTION: 79yo female with hx DVT, asthma, HTN admitted 7/8 for elective R total hip.  Post op developed ?sepsis, hypotension, abd pain with lactate 4.5 & AKI.  PCCM consulted.   SIGNIFICANT EVENTS / STUDIES:  7/8 R total hip  2D echo 7/10>>>hyperdynamic LV, nml EF, gr 1 diastolic dysfunction, findings s/o HOCM physiology 7/10 CT abd>>>Recent postoperative changes in the right hip and surrounding softtissues. Small bilateral pleural effusions with atelectasis or consolidation in the lung bases. Stool filled colon suggesting constipation. Anterior abdominal wall hernia containing fat with fat stranding suggesting possible fat necrosis.   LINES / TUBES: L IJ CVL 7/10>>>  CULTURES: Urine 7/10>>> BCx2 7/10>>>  ANTIBIOTICS: Vancomycin 7/10>>> Primaxin 7/10>>>  HISTORY OF PRESENT ILLNESS:  79yo female with hx HTN, asthma, remote DVT admitted 7/8 for R total hip.  Post op with increasing somnolence, hypotension, abd pain.  7/10 developed worsening hypotension, acute renal failure and encephalopathy though r/t sepsis of unknown etiology and PCCM consulted.  At time of consult pt is altered, very little UOP, SBP 70-80, tender abd, tachypneic. This am lactate 4.5.  No reports of vomiting, diarrhea.    PAST MEDICAL HISTORY :  Past Medical History  Diagnosis Date  . DVT (deep venous thrombosis)   . Asthma   . Arthritis   . Hypertension   . Right rib fracture   . Fibromyalgia   . Mental disorder   . Anxiety   . Depression   . Pneumonia     hx  . Peripheral vascular disease 70's    groin blood clot  . GERD (gastroesophageal reflux disease)   . H/O hiatal hernia   . Anemia     hx  . Dysrhythmia     "irregular" heart rate --- history of sinus  arrhythmia  . Stroke 03/2005   Past Surgical History  Procedure Laterality Date  . Abdominal hysterectomy    . Cholecystectomy    . Knee surgery Bilateral   . Endovascular stent insertion      left MCA stent 08/2005, 09/2006, intracranial angioplasty 01/2007  . Eye surgery Bilateral     cat  . Varicose vein surgery    . Total hip arthroplasty Right 12/21/2012    Procedure: TOTAL HIP ARTHROPLASTY ANTERIOR APPROACH;  Surgeon: Velna Ochs, MD;  Location: [facility] OR;  Service: Orthopedics;  Laterality: Right;  DEPUY   Prior to Admission medications   Medication Sig Start Date End Date Taking? Authorizing Provider  aspirin EC 81 MG tablet Take 81 mg by mouth daily.   Yes Historical Provider, MD  cholecalciferol (VITAMIN D) 1000 UNITS tablet Take 1,000 Units by mouth daily.   Yes Historical Provider, MD  clopidogrel (PLAVIX) 75 MG tablet Take 75 mg by mouth every other day.    Yes Historical Provider, MD  cyanocobalamin (,VITAMIN B-12,) 1000 MCG/ML injection Inject 1,000 mcg into the muscle every 30 (thirty) days.   Yes Historical Provider, MD  diltiazem (CARDIZEM CD) 240 MG 24 hr capsule Take 240 mg by mouth daily.     Yes Historical Provider, MD  Fluticasone-Salmeterol (ADVAIR) 500-50 MCG/DOSE AEPB Inhale 1 puff into the lungs every 12 (twelve) hours.   Yes Historical Provider, MD  HYDROcodone-acetaminophen (NORCO/VICODIN) 5-325 MG per tablet  Take 1 tablet by mouth every 4 (four) hours as needed for pain.   Yes Historical Provider, MD  losartan-hydrochlorothiazide (HYZAAR) 100-25 MG per tablet Take 1 tablet by mouth daily.     Yes Historical Provider, MD  meloxicam (MOBIC) 15 MG tablet Take 15 mg by mouth daily.   Yes Historical Provider, MD  montelukast (SINGULAIR) 10 MG tablet Take 10 mg by mouth at bedtime.   Yes Historical Provider, MD  pantoprazole (PROTONIX) 40 MG tablet Take 40 mg by mouth daily.   Yes Historical Provider, MD  polyethylene glycol (MIRALAX / GLYCOLAX) packet Take 17 g by  mouth daily as needed (for constipation).    Yes Historical Provider, MD  SPIRIVA HANDIHALER 18 MCG inhalation capsule Place 18 mcg into inhaler and inhale daily.  11/10/12  Yes Historical Provider, MD  trolamine salicylate (ASPERCREME) 10 % cream Apply 1 application topically as needed (for muscle pain).   Yes Historical Provider, MD  venlafaxine XR (EFFEXOR-XR) 150 MG 24 hr capsule Take 150 mg by mouth every morning.    Yes Historical Provider, MD  albuterol (PROVENTIL HFA;VENTOLIN HFA) 108 (90 BASE) MCG/ACT inhaler Inhale 2 puffs into the lungs every 6 (six) hours as needed for shortness of breath.     Historical Provider, MD   Allergies  Allergen Reactions  . Solarcaine Aloe Extra (Lidocaine Hcl)   . Septra (Bactrim)   . Cephalexin Rash    FAMILY HISTORY:  History reviewed. No pertinent family history. SOCIAL HISTORY:  reports that she has never smoked. She has never used smokeless tobacco. She reports that she does not drink alcohol or use illicit drugs.  REVIEW OF SYSTEMS:  Unable other than HPI.   VITAL SIGNS: Temp:  [98 F (36.7 C)-101.7 F (38.7 C)] 101.7 F (38.7 C) (07/10 1500) Pulse Rate:  [92-110] 110 (07/10 1500) Resp:  [21-32] 31 (07/10 1500) BP: (73-113)/(36-70) 73/46 mmHg (07/10 1500) SpO2:  [93 %-98 %] 93 % (07/10 1500) Weight:  [167 lb 8.8 oz (76 kg)] 167 lb 8.8 oz (76 kg) (07/09 1805) HEMODYNAMICS:   VENTILATOR SETTINGS:   INTAKE / OUTPUT: Intake/Output     07/09 0701 - 07/10 0700 07/10 0701 - 07/11 0700   P.O.     I.V. (mL/kg) 700 (9.2) 700 (9.2)   Blood 700    IV Piggyback 350 100   Total Intake(mL/kg) 1750 (23) 800 (10.5)   Urine (mL/kg/hr) 250 (0.1)    Blood     Total Output 250     Net +1500 +800          PHYSICAL EXAMINATION: General:  Chronically ill appearing female, acutely ill Neuro:  Altered, lethargic at times, moaning, does not follow commands HEENT:  Mm dry, no JVD Cardiovascular:  s1s2 tachy Lungs:  resps even, labored,  tachypneic, diminished, few scattered crackles Abdomen:  Tender diffusely, distended Musculoskeletal:  Cool, pale, no edema   LABS:  Recent Labs Lab Dec 11, 2012 0611  12/03/12 0540 12/03/12 1119 12/03/12 1649 12/03/12 2242 12/04/12 0030 12/04/12 0520 12/04/12 1200  HGB  --   < > 9.3*  --  7.7*  --  7.4* 9.4*  --   WBC  --   < > 11.8*  --  10.2  --  7.2  --   --   PLT  --   < > 237  --  209  --  178  --   --   NA  --   < > 137  --  135  --  134*  --   --   K  --   < > 5.2*  --  4.8  --  5.1  --   --   CL  --   < > 105  --  106  --  106  --   --   CO2  --   < > 23  --  19  --  22  --   --   GLUCOSE  --   < > 143*  --  87  --  99  --   --   BUN  --   < > 39*  --  47*  --  51*  --   --   CREATININE  --   < > 2.42*  --  2.64*  --  2.69*  --   --   CALCIUM  --   < > 9.3  --  8.0*  --  8.3*  --   --   AST  --   --   --  116*  --   --   --   --   --   ALT  --   --   --  119*  --   --   --   --   --   ALKPHOS  --   --   --  60  --   --   --   --   --   BILITOT  --   --   --  0.5  --   --   --   --   --   PROT  --   --   --  4.4*  --   --   --   --   --   ALBUMIN  --   --   --  2.4*  --   --   --   --   --   APTT 61*  --   --   --   --   --   --   --   --   LATICACIDVEN  --   --   --   --  4.5*  --   --   --   --   TROPONINI  --   --   --  <0.30 <0.30 <0.30  --   --   --   PROCALCITON  --   --   --   --  5.19  --   --   --   --   PHART  --   --   --   --   --   --   --   --  7.343*  PCO2ART  --   --   --   --   --   --   --   --  33.6*  PO2ART  --   --   --   --   --   --   --   --  59.2*  < > = values in this interval not displayed. No results found for this basename: GLUCAP,  in the last 168 hours  CXR: Ct Abdomen Pelvis Wo Contrast  12/03/2012   *RADIOLOGY REPORT*  Clinical Data: Generalized abdominal pain.  CT ABDOMEN AND PELVIS WITHOUT CONTRAST  Technique:  Multidetector CT imaging of the abdomen and pelvis was performed following the standard protocol without intravenous contrast.   Comparison: 07/04/2012  Findings: Minimal bilateral pleural effusions with basilar  atelectasis or consolidation in both lung bases.  Coronary artery calcification and aortic valve calcification.  Mild cardiac enlargement.  Large esophageal hiatal hernia.  Surgical absence of the gallbladder.  The unenhanced appearance of the liver, spleen, pancreas, adrenal glands, and retroperitoneal lymph nodes is unremarkable.  The inferior vena cava is flattened which may suggest hypovolemia.  Calcified and tortuous aorta without aneurysm.  The kidneys are atrophic without hydronephrosis. Calcification is present in the right renal hilum which could represent a nonobstructing stone or vascular calcifications.  The stomach is decompressed.  Small bowel are not abnormally distended. Diffusely stool filled colon with mild distension suggesting constipation.  No free air or free fluid in the abdomen.  There is an inferior midline anterior abdominal wall hernia containing fat. Herniated fat demonstrates stranding which may represent fat necrosis due to incarceration of the fat.  Pelvis:  Visualization of the low pelvis is limited due to streak artifact arising from right hip prosthesis.  The bladder appears to be decompressed with Foley catheter.  Uterus is surgically absent. No abnormal adnexal masses.  The appendix appears to be surgically absent.  No free or loculated pelvic fluid collections.  Stool filled rectosigmoid colon without evidence of diverticulitis. There is infiltration in the subcutaneous fat over the right hip with subcutaneous gas collections and skin clips. Mild hematoma in the right iliac this muscle.  This likely represents recent hip surgery change.  No definite abscess.  Scoliosis and degenerative change in the lumbar spine.  No destructive bone lesions are appreciated.  IMPRESSION: Recent postoperative changes in the right hip and surrounding soft tissues.  Small bilateral pleural effusions with atelectasis  or consolidation in the lung bases.  Stool filled colon suggesting constipation.  Anterior abdominal wall hernia containing fat with fat stranding suggesting possible fat necrosis.   Original Report Authenticated By: Burman Nieves, M.D.   Ct Head Wo Contrast  12/03/2012   *RADIOLOGY REPORT*  Clinical Data: 79 year old female with acute encephalopathy.  CT HEAD WITHOUT CONTRAST  Technique:  Contiguous axial images were obtained from the base of the skull through the vertex without contrast.  Comparison: 06/30/2012 and earlier.  Findings: Visualized paranasal sinuses and mastoids are clear.  No acute osseous abnormality identified.  Intermittent mild motion artifact.  No acute orbit or scalp soft tissue findings.  Calcified atherosclerosis at the skull base.  There is a left MCA M1 segment stent re-identified.  Stable cerebral volume.  Chronic left MCA encephalomalacia.  Stable ventricles with ex vacuo change to the left lateral ventricle. No midline shift, mass effect, or evidence of mass lesion.  No evidence of cortically based acute infarction identified.  No acute intracranial hemorrhage identified.  No suspicious intracranial vascular hyperdensity.  IMPRESSION: No acute intracranial abnormality. Stable noncontrast CT appearance of the brain with chronic left MCA infarct and left MCA M1 segment stent.   Original Report Authenticated By: Erskine Speed, M.D.   US Abdomen Complete  12/03/2012   *RADIOLOGY REPORT*  Clinical Data:  Elevated LFTs, acute on chronic renal failure  ABDOMINAL ULTRASOUND COMPLETE  Comparison:  CT abdomen/pelvis 07/04/2012  Findings:  Gallbladder:  The gallbladder is surgically absent.  Common Bile Duct:  Within normal limits in caliber.  Liver: Heterogeneous hepatic parenchyma without definite echogenicity.  The liver appears slightly hypoechoic prominent echogenicity of the portal triads.  IVC:  Poorly seen secondary to obscuring bowel gas.  Pancreas:  Not well seen secondary to  obscuring bowel gas.  Spleen:  Sonographically unremarkable.  Normal in size 6.6 cm.  Right kidney:  Mild renal cortical atrophy.  No hydronephrosis. Extrarenal pelvis noted incidentally. Kidney measures 9.3 cm in length.  Left kidney:  Mild renal cortical atrophy.  No hydronephrosis.  The kidney measures 12.3 cm in length.  Abdominal Aorta:  Not well seen secondary to obscuring bowel gas.  IMPRESSION:  1.  No acute abnormality.  No hydronephrosis. 2.  Slightly hypoechoic hepatic parenchyma with increased echogenicity of the portal triads.  While nonspecific, similar findings can be seen with acute hepatitis. 3.  Mild bilateral renal cortical atrophy. 4.  Surgical changes of cholecystectomy.   Original Report Authenticated By: Malachy Moan, M.D.   Dg Chest Port 1 View  12/04/2012   *RADIOLOGY REPORT*  Clinical Data: Short of breath, sepsis  PORTABLE CHEST - 1 VIEW  Comparison: Prior chest x-ray 12/03/2012  Findings: Stable cardiomegaly.  Probable hiatal hernia. Atherosclerotic calcifications in the transverse aorta.  Slightly lower inspiratory volumes with increased bibasilar opacities. Lower volumes results in crowding of the pulmonary vasculature. There may be slightly increased vascular congestion but no overt edema.  No pneumothorax.  IMPRESSION: Lower inspiratory volumes with increased bibasilar opacities and slightly increased pulmonary vascular congestion.   Original Report Authenticated By: Malachy Moan, M.D.   Dg Chest Port 1 View  12/03/2012   *RADIOLOGY REPORT*  Clinical Data: Postop hip replacement, hypoxemia  PORTABLE CHEST - 1 VIEW  Comparison: Chest x-ray of 11/25/2012  Findings: There is bibasilar atelectasis, with more opacity at the left lung base also suggesting the presence of left pleural effusion.  A two-view chest x-ray would be helpful.  Cardiomegaly is stable.  No acute bony abnormality is seen with degenerative change noted in both shoulders.  IMPRESSION: Diminished aeration  with bibasilar atelectasis.  Suspect left pleural effusion as well.  Consider two-view chest x-ray.   Original Report Authenticated By: Dwyane Dee, M.D.   Dg Abd Portable 1v  12/03/2012   *RADIOLOGY REPORT*  Clinical Data: Right hip arthroplasty.  Abdominal pain  PORTABLE ABDOMEN - 1 VIEW  Comparison: 07/04/2012  Findings: Large amount of stool is present throughout the colon compatible with constipation.  No bowel obstruction.  Lumbar levoscoliosis.  Right hip replacement in satisfactory position.  Gallbladder clips in the right upper quadrant.  IMPRESSION: Moderate to severe constipation.  Negative for bowel obstruction.   Original Report Authenticated By: Janeece Riggers, M.D.     ASSESSMENT / PLAN:  PULMONARY Acute resp failure - in setting sepsis, abd distension  P:   O2 as need  BD  resp status tenuous - monitor for need to intubate  Caution with volume resuscitation  F/u abg  F/u CXR   CARDIOVASCULAR Shock/hypotension  Severe sepsis  Echo s/o HOCM physiology P:  Lactate >4 this am  Recheck lactate  3L total given  Cont gentle volume  Start vasopressors  Cortisol  Stress steroids  Monitor CVP   RENAL Acute renal failure - in setting sepsis, hypovolemia +/- meloxicam, hctz  Metabolic acidosis  P:   Volume per CVP Hold anti-htn F/u chem  No hydro on renal u/s  GASTROINTESTINAL abd pain  P:   CT abd ess benign  Monitor  ?abd source sepsis - nothing obvious on CT ?fat necrosis   HEMATOLOGIC Anemia  P:  F/u cbc  Hold further PRBC for now (s/p 1 unit PRBC 7/9)  INFECTIOUS Sepsis - unclear source, doubt hip, ? Bowel source P:   Pan culture  Vanc, imipenem as above  Monitor culture data   ENDOCRINE DM P:   SSI  NEUROLOGIC AMS - multifactorial r/t sepsis and acute renal failure  P:   Supportive care  Avoid sedating medications - dc dilaudid & use fentanyl instead in renal failure rx sepsis, renal failure   GLOBAL - detailed discussion with son &  daughter , discussed plan & prognosis  WHITEHEART,KATHRYN, NP 12/04/2012  5:18 PM Pager: [phone] or [phone]  *Care during the described time interval was provided by me and/or other providers on the critical care team. I have reviewed this patient's available data, including medical history, events of note, physical examination and test results as part of my evaluation.  I have personally obtained a history, examined the patient, evaluated laboratory and imaging results, formulated the assessment and plan and placed orders. CRITICAL CARE: The patient is critically ill with multiple organ systems failure and requires high complexity decision making for assessment and support, frequent evaluation and titration of therapies, application of advanced monitoring technologies and extensive interpretation of multiple databases. Critical Care Time devoted to patient care services described in this note is 60 minutes.    Cyril Mourning MD. Tonny Bollman. [facility] Pager [phone] If no response call [phone]

## 2012-12-04 NOTE — Progress Notes (Signed)
Update: Since bolus, pt's BP has been over 90 systolic. CXR read is pending. Hgb is up to 9.4 after i Unit PRBCs.  Eula Listen, NP [facility]

## 2012-12-04 NOTE — Evaluation (Signed)
Physical Therapy Evaluation Patient Details Name: Natalie Kent MRN: [id number] DOB: May 09, 1931 Today's Date: 12/04/2012 Time: 3086-5784 PT Time Calculation (min): 42 min  PT Assessment / Plan / Recommendation History of Present Illness  s/p elective R THA anterior approach, pt now with septic shock, UTI, acute kidney injury  Clinical Impression  Pt continues to be very lethargic and moans throughout session. Pt's BP increased sitting EOB therefore pt was transferred to recliner however once in recliner pt's BP began to decrease.  Therefore pt was returned to supine.  RN aware of BP.    PT Assessment       Follow Up Recommendations  SNF    Equipment Recommendations  None recommended by PT    Frequency 7X/week    Precautions / Restrictions Precautions Precautions: Fall;Anterior Hip Restrictions Weight Bearing Restrictions: Yes RLE Weight Bearing: Weight bearing as tolerated   Pertinent Vitals/Pain Unable to rate but continues to moan throughout session.          Mobility  Bed Mobility Bed Mobility: Supine to Sit;Sitting - Scoot to Edge of Bed;Sit to Supine Supine to Sit: 1: +2 Total assist;HOB elevated Supine to Sit: Patient Percentage: 10% Sitting - Scoot to Edge of Bed: 1: +2 Total assist Sitting - Scoot to Edge of Bed: Patient Percentage: 10% Sit to Supine: 1: +2 Total assist;HOB flat Sit to Supine: Patient Percentage: 10% Details for Bed Mobility Assistance: +2 (A) with all mobility due to lethargy.  Pt resisting movements at time to attempt to return to supine Transfers Transfers: Sit to Stand;Stand to Sit;Stand Pivot Transfers Sit to Stand: 1: +2 Total assist;With upper extremity assist;With armrests;From chair/3-in-1 Sit to Stand: Patient Percentage: 50% Stand to Sit: 1: +2 Total assist;Without upper extremity assist;To chair/3-in-1 Stand to Sit: Patient Percentage: 20% Stand Pivot Transfers: 1: +2 Total assist;From elevated surface Stand Pivot Transfers:  Patient Percentage: 20% Details for Transfer Assistance: +2 (A) to initiate transfer and cues for technique.  Pt performed stand pivot x 2 due to return to bed and supine secondary to low BP in recliner. Ambulation/Gait Ambulation/Gait Assistance: Not tested (comment)    Exercises     PT Diagnosis:    PT Problem List:   PT Treatment Interventions:       PT Goals(Current goals can be found in the care plan section) Acute Rehab PT Goals Patient Stated Goal: Unable due to lethargy and cogntive status PT Goal Formulation: With patient/family Time For Goal Achievement: 12/09/12 Potential to Achieve Goals: Fair  Visit Information  Last PT Received On: 12/04/12 Assistance Needed: +2 History of Present Illness: s/p elective R THA anterior approach, pt now with septic shock, UTI, acute kidney injury       Prior Functioning       Cognition  Cognition Arousal/Alertness: Lethargic Behavior During Therapy: Flat affect Overall Cognitive Status: Difficult to assess Difficult to assess due to: Level of arousal    Extremity/Trunk Assessment     Balance Balance Balance Assessed: Yes Static Sitting Balance Static Sitting - Balance Support: Feet supported;Bilateral upper extremity supported Static Sitting - Level of Assistance: 2: Max assist;4: Min assist Static Sitting - Comment/# of Minutes: Pt with occasional min (A) to maintain upright posture.  Pt with posterior lean and resisting upright posture at times.  Pt tends to lean to left or right side in sitting.   End of Session PT - End of Session Equipment Utilized During Treatment: Gait belt;Oxygen Activity Tolerance: Patient limited by lethargy;Patient limited by pain Patient  left: in bed;with call bell/phone within reach;with family/visitor present Nurse Communication: Mobility status  GP     Yanky Vanderburg 12/04/2012, 12:46 PM

## 2012-12-04 NOTE — Procedures (Signed)
Central Venous Catheter Insertion Procedure Note SUNNIE ODDEN [id number] [birth year]  Procedure: Insertion of Central Venous Catheter Indications: Assessment of intravascular volume and Drug and/or fluid administration  Procedure Details Consent: Risks of procedure as well as the alternatives and risks of each were explained to the (patient/caregiver).  Consent for procedure obtained. Time Out: Verified patient identification, verified procedure, site/side was marked, verified correct patient position, special equipment/implants available, medications/allergies/relevent history reviewed, required imaging and test results available.  Performed  Maximum sterile technique was used including antiseptics, cap, gloves, gown, hand hygiene, mask and sheet. Skin prep: Chlorhexidine; local anesthetic administered A antimicrobial bonded/coated triple lumen catheter was placed in the left internal jugular vein using the Seldinger technique.  Evaluation Blood flow good Complications: No apparent complications Patient did tolerate procedure well. Chest X-ray ordered to verify placement.  CXR: pending.  Performed using ultrasound guidance under direct MD supervision.   WHITEHEART,KATHRYN, NP  12/04/2012, 5:02 PM  Ultrasound used for site verification, live visualisation of needle entry & guidewire prior to dilation  Adrine Hayworth V.

## 2012-12-04 NOTE — Progress Notes (Signed)
Utilization Review Completed. 12/04/2012

## 2012-12-04 NOTE — Progress Notes (Signed)
ANTIBIOTIC CONSULT NOTE - FOLLOW UP  Pharmacy Consult for Vancomycin + Primaxin Indication: rule out sepsis  Allergies  Allergen Reactions  . Solarcaine Aloe Extra (Lidocaine Hcl)   . Septra (Bactrim)   . Cephalexin Rash    Patient Measurements: Height: 5\' 3"  (160 cm) Weight: 167 lb 8.8 oz (76 kg) IBW/kg (Calculated) : 52.4  Vital Signs: Temp: 99.6 F (37.6 C) (07/10 0800) Temp src: Axillary (07/10 0800) BP: 101/70 mmHg (07/10 1200) Pulse Rate: 106 (07/10 1200) Intake/Output from previous day: 07/09 0701 - 07/10 0700 In: 1750 [I.V.:700; Blood:700; IV Piggyback:350] Out: 250 [Urine:250] Intake/Output from this shift: Total I/O In: 800 [I.V.:700; IV Piggyback:100] Out: -   Labs:  Recent Labs  12/03/12 0540 12/03/12 1116 12/03/12 1649 12/04/12 0030 12/04/12 0520  WBC 11.8*  --  10.2 7.2  --   HGB 9.3*  --  7.7* 7.4* 9.4*  PLT 237  --  209 178  --   LABCREA  --  169.96  --   --   --   CREATININE 2.42*  --  2.64* 2.69*  --    Estimated Creatinine Clearance: 16.5 ml/min (by C-G formula based on Cr of 2.69). No results found for this basename: VANCOTROUGH, Leodis Binet, VANCORANDOM, GENTTROUGH, GENTPEAK, GENTRANDOM, TOBRATROUGH, TOBRAPEAK, TOBRARND, AMIKACINPEAK, AMIKACINTROU, AMIKACIN,  in the last 72 hours   Microbiology: Recent Results (from the past 720 hour(s))  SURGICAL PCR SCREEN     Status: Abnormal   Collection Time    11/25/12  2:37 PM      Result Value Range Status   MRSA, PCR POSITIVE (*) NEGATIVE Final   Staphylococcus aureus POSITIVE (*) NEGATIVE Final   Comment:            The Xpert SA Assay (FDA     approved for NASAL specimens     in patients over 54 years of age),     is one component of     a comprehensive surveillance     program.  Test performance has     been validated by [employer] for patients greater     than or equal to 27 year old.     It is not intended     to diagnose infection nor to     guide or monitor treatment.  URINE  CULTURE     Status: None   Collection Time    11/25/12  2:37 PM      Result Value Range Status   Specimen Description URINE, CLEAN CATCH   Final   Special Requests NONE   Final   Culture  Setup Time 11/25/2012 15:39   Final   Colony Count 80,000 COLONIES/ML   Final   Culture     Final   Value: GROUP B STREP(S.AGALACTIAE)ISOLATED     Note: TESTING AGAINST S. AGALACTIAE NOT ROUTINELY PERFORMED DUE TO PREDICTABILITY OF AMP/PEN/VAN SUSCEPTIBILITY.   Report Status 11/26/2012 FINAL   Final    Anti-infectives   Start     Dose/Rate Route Frequency Ordered Stop   12/03/12 2200  vancomycin (VANCOCIN) IVPB 750 mg/150 ml premix     750 mg 150 mL/hr over 60 Minutes Intravenous Every 24 hours 12/03/12 1642     12/03/12 1800  imipenem-cilastatin (PRIMAXIN) 250 mg in sodium chloride 0.9 % 100 mL IVPB     250 mg 200 mL/hr over 30 Minutes Intravenous 4 times per day 12/03/12 1642     11/25/2012 2000  vancomycin (VANCOCIN) IVPB  1000 mg/200 mL premix     1,000 mg 200 mL/hr over 60 Minutes Intravenous Every 12 hours 12/07/2012 1139 12/24/2012 2322   12/03/2012 0600  vancomycin (VANCOCIN) IVPB 1000 mg/200 mL premix     1,000 mg 200 mL/hr over 60 Minutes Intravenous On call to O.R. 12/01/12 1441  0742      Assessment: 79 y.o. F who continues on Vancomycin + Primaxin for empiric r/o sepsis coverage. BCx/UCx are still pending. Pt still hypotensive and afebrile however WBC has trended back down to wnl. Renal function has continued to worsen -- requiring antibiotic dose adjustments today.  Goal of Therapy:  Vancomycin trough level 15-20 mcg/ml Proper antibiotics for infection/cultures adjusted for renal/hepatic function   Plan:  1. Reduce Vancomycin to 1g IV every 48 hours 2. Reduce Primaxin to 250 mg IV ever 12 hours 3. Will continue to follow renal function, culture results, LOT, and antibiotic de-escalation plans   Georgina Pillion, PharmD, BCPS Clinical Pharmacist Pager: [phone] 12/04/2012  2:05 PM

## 2012-12-04 NOTE — Procedures (Signed)
Intubation Procedure Note Natalie Kent [id number] [birth year]  Procedure: Intubation Indications: Respiratory insufficiency  Procedure Details Consent: Unable to obtain consent because of emergent medical necessity. Time Out: Verified patient identification, verified procedure, site/side was marked, verified correct patient position, special equipment/implants available, medications/allergies/relevent history reviewed, required imaging and test results available.  Performed  Drugs Etomidate 20mg , Rocuronium 70mg  DL x 1 with MAC 4 blade Grade 1 view 7.5 tube passed through cords under direct visualization Placement confirmed with bilateral breath sounds, positive EtCO2 change and smoke in tube   Evaluation Hemodynamic Status: BP stable throughout; O2 sats: stable throughout Patient's Current Condition: stable Complications: No apparent complications Patient did tolerate procedure well. Chest X-ray ordered to verify placement.  CXR: pending.   MCQUAID, DOUGLAS 12/04/2012

## 2012-12-04 NOTE — Progress Notes (Signed)
[facility] P[facility]M  Called to bedside emergently for respiratory failure, worsening shock.  Intubated by me, see note.  Exam  Filed Vitals:   12/04/12 1945 12/04/12 1953 12/04/12 2107 12/04/12 2155  BP: 95/48  109/47 103/44  Pulse: 143  133 120  Temp:      TempSrc:      Resp: 33 29 20 28   Height:   5\' 6"  (1.676 m)   Weight:      SpO2: 92% 100% 95% 96%   Gen: agonal respirations PULM: Coarse breath sounds CV: tach, regular Ab: distended, soft Ext: warm, trace edema  CXR: Left lung infiltrate and effusion  Impression: 1) Acute respiratory failure due to pneumonia, septic shock 2) Septic shock from pneumonia 3) HCAP 4) Parapneumonic effusion 5) Ileus  Plan: 1) KUB/OG tube 2) full vent support 3) add vasopressin 4) A-line 5) resp culture 6) increased RR and PEEP  CC time by me 45 minutes. Family updated at bedside at length.  Yolonda Kida PCCM Pager: [phone] Cell: [phone] If no response, call [phone]

## 2012-12-04 NOTE — Consult Note (Addendum)
[facility] Consult Note [place] TEAM 1 - Stepdown/ICU TEAM   FARTUN PARADISO ZOX:[id number] DOB: 1930-12-22 DOA: Dec 22, 2012 PCP: Londell Moh, MD  Attending physician: Dr. Dalldorf/orthopedics  Brief narrative: 79 year old female with history of hypertension, asthma, depression, remote DVT, presented to the hospital for elective right total hip arthroplasty which was performed on 12/14/2012 by Dr. Marcene Corning. Since the surgery, the patient has had increasing somnolence and also complained of chest discomfort and abdominal pain. The patient was encephalopathic and unable to provide any history. All of the history was obtained from speaking with Dr. Yisroel Ramming and pt's daughter at bedside. The patient has had decreasing urine output since admission to the hospital. Foley catheter was placed which revealed urine output of 1415 cc over the last 24 hours. No reports of vomiting, diarrhea, respiratory distress. At baseline, the patient is alert and oriented x3 and lives with her daughter. The patient was previously taking meloxicam prior to admission. In addition, she has continued on this and HCTZ since admission. She also received a dose of vancomycin on 11/27/2012 for preop prophylaxis.  After the initial consultation was performed patient was noted to have severe increasing abdominal pain with hypotension and very little urine output despite Foley catheter. Her blood pressure was very soft snoring on hypotension and she had a low-grade rectal temperature 101.1. She was subsequently transferred to the step down unit. She did require multiple fluid boluses overnight for hypotension and has subsequently remained very lethargic. Concerns or she may be developing sepsis her lactic acid was elevated at 4.5 and Procalcitonin was elevated at 5.19 but this in the setting of acute renal failure. Empiric broad-spectrum antibiotics were initiated and pan cultures were  obtained.   Assessment/Plan: Acute encephalopathy  -This is multifactorial including AKI, opioids, possible UTI, medications (losartan, HCTZ, meloxicam)  -Sepsis also in the differential-see below -CT brain without any acute changes -Ammonia level normal -Urinalysis abnormal and daughter endorses recent OP tx for UTI (strep agalactiae from cx 7/1) -urine culture pending this admit -Still very lethargic - ABG reveals met acidosis and hypoxia -NPO  Hypotension/dehydration/Hyperkalemia - ?? Sepsis -cont IVF but increase rate to 150/hr since normal LVEFand give NS 500 cc bolus today- given a second bolus of 250 due to MAP < 55 -anti-HTN meds on hold -PCT 5.19 c/w likely bacterial infection but this in setting of ARF -cont empiric anbx's. -Lactic acid 4.5 and likely due to dehydration- will repeat now and in am if not improved may need to consult surgery ?? Etiology fat necrosis  ABL anemia/post op/ symptomatic -hgb decr.to 7.4 overnite and was given 1 unit PRBC's  -FU Hgb was 9.4 -pre op hgb 11  Acute on chronic renal failure/CKD stage III  -May be hypovolemia versus medication induced especially given obstructive component (>1000 cc out after foley placed) -Losartan and HCTZ were dc'd -Renal ultrasound without hydronephrosis -cont IVF- -Urine sodium 11 c/w dehydration  Abdominal pain/umbiluical hernia with fat necrosis  -has focal supra umbilical abdominal pain which is severe- there is an area of palpable fullness which is not hard and CT shows no bowel involvement.  -Discussed informally with general surgeon who says will likely need at a minimum elective repair but needs to stabilize medically- confirms this condition is very painful -Abdominal x-ray shows moderate to severe constipation -cont TID enema's and prn Dulcolax supp.  Chest discomfort  -Cardiac enzymes negative x 3 -EKG non ischemic -Chest x-ray mild increase in pulmonary vasc. congestion (?? Early ARDS)  DVT  prophylaxis: SCDs Code Status: Full Family Communication: Spoke with patient's daughter at bedside Disposition Plan: Remain in step down Isolation: Contact isolation for MRSA PCR positive status Nutritional Status: Acute protein calorie malnutrition related to altered mentation, increased abdominal pain and constipation  Consultants: [facility]  Procedures: RIGHT TOTAL HIP ARTHROPLASTY ANTERIOR APPROACH 7/8  Antibiotics: Primaxin 7/9 >>> Vancomycin 7/9 >>>  HPI/Subjective: Patient very lethargic and only arouses with palpation of abdomen noting very severe focal supraumbilical pain with guarding but no rebounding. Patient unable to contribute to exam and history otherwise due to lethargy.   Objective: Blood pressure 97/43, pulse 102, temperature 99.6 F (37.6 C), temperature source Axillary, resp. rate 24, height 5\' 3"  (1.6 m), weight 76 kg (167 lb 8.8 oz), SpO2 95.00%.  Intake/Output Summary (Last 24 hours) at 12/04/12 1109 Last data filed at 12/04/12 0813  Gross per 24 hour  Intake   1750 ml  Output    250 ml  Net   1500 ml     Exam: General: No acute respiratory distress Lungs: Coarse to auscultation bilaterally without wheezes or crackles, 2 L with sat 95 %- tachypnea improving Cardiovascular: Regular rate and rhythm without murmur gallop or rub normal S1 and S2, no peripheral edema or JVD-systolic blood pressure soft ranging from a low of 79 to a high of 97 with MAP less than 65 Abdomen: Focally tender supraumbilical, nondistended, soft, bowel sounds positive, no rebound, no ascites, there is a fullness in the supraumbilical area that is palpable but is not hard Musculoskeletal: No significant cyanosis, clubbing of bilateral lower extremities-postoperative and ecchymosis without hematoma right hip Neurological: Lethargic, very limited spontaneous movement but does not appear to have focal deficits and mainly awakens to pain only  Scheduled Meds: Scheduled  Meds: . aspirin EC  325 mg Oral BID  . cholecalciferol  1,000 Units Oral Daily  . clopidogrel  75 mg Oral QODAY  . [START ON 01/01/2013] cyanocobalamin  1,000 mcg Intramuscular Q30 days  . docusate sodium  200 mg Oral BID  . imipenem-cilastatin  250 mg Intravenous Q6H  . mometasone-formoterol  2 puff Inhalation BID  . montelukast  10 mg Oral QHS  . pantoprazole  40 mg Oral Daily  . sodium phosphate  1 enema Rectal TID  . tiotropium  18 mcg Inhalation Daily  . vancomycin  750 mg Intravenous Q24H  . venlafaxine XR  150 mg Oral q morning - 10a   Continuous Infusions: . sodium chloride 100 mL/hr at 12/03/12 1820    **Reviewed in detail by the Attending Physician   Data Reviewed: Basic Metabolic Panel:  Recent Labs Lab 12/01/2012 0922 12/18/2012 2035 12/03/12 0540 12/03/12 1649 12/04/12 0030  NA 138 135 137 135 134*  K 3.5 4.7 5.2* 4.8 5.1  CL  --  104 105 106 106  CO2  --  23 23 19 22   GLUCOSE 159* 132* 143* 87 99  BUN  --  30* 39* 47* 51*  CREATININE  --  1.88* 2.42* 2.64* 2.69*  CALCIUM  --  8.2* 9.3 8.0* 8.3*   Liver Function Tests:  Recent Labs Lab 12/03/12 1119  AST 116*  ALT 119*  ALKPHOS 60  BILITOT 0.5  PROT 4.4*  ALBUMIN 2.4*   No results found for this basename: LIPASE, AMYLASE,  in the last 168 hours  Recent Labs Lab 12/03/12 1119  AMMONIA 17   CBC:  Recent Labs Lab 11/30/2012 2035 12/03/12 0540 12/03/12 1649 12/04/12 0030 12/04/12 0520  WBC 16.8* 11.8* 10.2 7.2  --   NEUTROABS 14.0*  --   --   --   --   HGB 8.6* 9.3* 7.7* 7.4* 9.4*  HCT 25.8* 27.9* 22.8* 22.4* 27.1*  MCV 83.8 83.8 83.5 83.9  --   PLT 223 237 209 178  --    Cardiac Enzymes:  Recent Labs Lab 12/03/12 1119 12/03/12 1649 12/03/12 2242  TROPONINI <0.30 <0.30 <0.30   BNP (last 3 results) No results found for this basename: PROBNP,  in the last 8760 hours CBG: No results found for this basename: GLUCAP,  in the last 168 hours  Recent Results (from the past 240  hour(s))  SURGICAL PCR SCREEN     Status: Abnormal   Collection Time    11/25/12  2:37 PM      Result Value Range Status   MRSA, PCR POSITIVE (*) NEGATIVE Final   Staphylococcus aureus POSITIVE (*) NEGATIVE Final   Comment:            The Xpert SA Assay (FDA     approved for NASAL specimens     in patients over 62 years of age),     is one component of     a comprehensive surveillance     program.  Test performance has     been validated by [employer] for patients greater     than or equal to 93 year old.     It is not intended     to diagnose infection nor to     guide or monitor treatment.  URINE CULTURE     Status: None   Collection Time    11/25/12  2:37 PM      Result Value Range Status   Specimen Description URINE, CLEAN CATCH   Final   Special Requests NONE   Final   Culture  Setup Time 11/25/2012 15:39   Final   Colony Count 80,000 COLONIES/ML   Final   Culture     Final   Value: GROUP B STREP(S.AGALACTIAE)ISOLATED     Note: TESTING AGAINST S. AGALACTIAE NOT ROUTINELY PERFORMED DUE TO PREDICTABILITY OF AMP/PEN/VAN SUSCEPTIBILITY.   Report Status 11/26/2012 FINAL   Final     Studies:  Recent x-ray studies have been reviewed in detail by the Attending Physician    Junious Silk, ANP [facility] Office  [phone] Pager [phone]  **If unable to reach the above provider after paging please contact the Flow Manager @ [phone]  On-Call/Text Page:      [email or web address]      password [email or web address]  If 7PM-7AM, please contact night-coverage [email or web address] Password [email or web address] 12/04/2012, 11:09 AM   LOS: 2 days   I have examined the patient, reviewed the chart and modified the above note which I agree with.   Ramzi Brathwaite,MD [phone] 12/04/2012, 3:19 PM

## 2012-12-04 NOTE — Progress Notes (Signed)
  Echocardiogram 2D Echocardiogram has been performed.  Arvil Chaco 12/04/2012, 3:43 PM

## 2012-12-04 NOTE — Progress Notes (Signed)
Subjective: 2 Days Post-Op Procedure(s) (LRB): TOTAL HIP ARTHROPLASTY ANTERIOR APPROACH (Right) Patient seen in the step down unit. Somewhat responsive. Can answer questions. Activity level:  Currently in bed. When the condition improves may be weightbearing as tolerated with therapy. Diet tolerance:  Light Voiding:  Foley catheter with decreased urine output Patient reports pain as 3 on 0-10 scale.    Objective: Vital signs in last 24 hours: Temp:  [98 F (36.7 C)-101.1 F (38.4 C)] 99.6 F (37.6 C) (07/10 0800) Pulse Rate:  [92-110] 102 (07/10 0800) Resp:  [14-31] 24 (07/10 0800) BP: (79-113)/(36-70) 97/43 mmHg (07/10 0800) SpO2:  [93 %-98 %] 95 % (07/10 1034) Weight:  [76 kg (167 lb 8.8 oz)] 76 kg (167 lb 8.8 oz) (07/09 1805)  Labs:  Recent Labs  12-11-12 2035 12/03/12 0540 12/03/12 1649 12/04/12 0030 12/04/12 0520  HGB 8.6* 9.3* 7.7* 7.4* 9.4*    Recent Labs  12/03/12 1649 12/04/12 0030 12/04/12 0520  WBC 10.2 7.2  --   RBC 2.73* 2.67*  --   HCT 22.8* 22.4* 27.1*  PLT 209 178  --     Recent Labs  12/03/12 1649 12/04/12 0030  NA 135 134*  K 4.8 5.1  CL 106 106  CO2 19 22  BUN 47* 51*  CREATININE 2.64* 2.69*  GLUCOSE 87 99  CALCIUM 8.0* 8.3*   No results found for this basename: LABPT, INR,  in the last 72 hours  Physical Exam:  Neurologically intact Neurovascular intact Sensation intact distally Intact pulses distally Dorsiflexion/Plantar flexion intact Incision: moderate drainage No cellulitis present Compartment soft  Assessment/Plan:  2 Days Post-Op Procedure(s) (LRB): TOTAL HIP ARTHROPLASTY ANTERIOR APPROACH (Right) Currently is on vancomycin for potential sepsis Decreased kidney function-AKI Hypotension Plan: Very grateful for medicine consult and help. Continue ASA 325 twice a day for DVT prophylaxis. X4 weeks. SCDs Will have dressing change right hip today. When patient's condition improves she may be out of bed weightbearing as  tolerated with therapy.     Natalie Kent R 12/04/2012, 10:58 AM

## 2012-12-05 ENCOUNTER — Inpatient Hospital Stay (HOSPITAL_COMMUNITY): Payer: Medicare Other

## 2012-12-05 LAB — TYPE AND SCREEN
Antibody Screen: NEGATIVE
Unit division: 0
Unit division: 0

## 2012-12-05 LAB — POCT I-STAT 3, ART BLOOD GAS (G3+)
Acid-base deficit: 7 mmol/L — ABNORMAL HIGH (ref 0.0–2.0)
Bicarbonate: 17.8 mEq/L — ABNORMAL LOW (ref 20.0–24.0)
Bicarbonate: 18.2 mEq/L — ABNORMAL LOW (ref 20.0–24.0)
O2 Saturation: 100 %
Patient temperature: 98.8
Patient temperature: 99
TCO2: 19 mmol/L (ref 0–100)
TCO2: 19 mmol/L (ref 0–100)
pCO2 arterial: 29.3 mmHg — ABNORMAL LOW (ref 35.0–45.0)
pH, Arterial: 7.375 (ref 7.350–7.450)
pH, Arterial: 7.403 (ref 7.350–7.450)

## 2012-12-05 LAB — CARBOXYHEMOGLOBIN
Carboxyhemoglobin: 1.2 % (ref 0.5–1.5)
Methemoglobin: 1.3 % (ref 0.0–1.5)
O2 Saturation: 97.8 %
Total hemoglobin: 7.5 g/dL — ABNORMAL LOW (ref 12.0–16.0)

## 2012-12-05 LAB — CBC
MCH: 29.2 pg (ref 26.0–34.0)
Platelets: 153 10*3/uL (ref 150–400)
RBC: 2.81 MIL/uL — ABNORMAL LOW (ref 3.87–5.11)
RDW: 15.8 % — ABNORMAL HIGH (ref 11.5–15.5)
WBC: 9.2 10*3/uL (ref 4.0–10.5)

## 2012-12-05 LAB — BASIC METABOLIC PANEL
Calcium: 8 mg/dL — ABNORMAL LOW (ref 8.4–10.5)
GFR calc non Af Amer: 19 mL/min — ABNORMAL LOW (ref >90)
Glucose, Bld: 126 mg/dL — ABNORMAL HIGH (ref 70–99)
Sodium: 142 mEq/L (ref 135–145)

## 2012-12-05 LAB — CORTISOL: Cortisol, Plasma: 30.4 ug/dL

## 2012-12-05 LAB — GLUCOSE, CAPILLARY
Glucose-Capillary: 101 mg/dL — ABNORMAL HIGH (ref 70–99)
Glucose-Capillary: 110 mg/dL — ABNORMAL HIGH (ref 70–99)

## 2012-12-05 LAB — PROCALCITONIN: Procalcitonin: 36.32 ng/mL

## 2012-12-05 LAB — PHOSPHORUS: Phosphorus: 7.4 mg/dL — ABNORMAL HIGH (ref 2.3–4.6)

## 2012-12-05 LAB — LIPASE, BLOOD: Lipase: 7 U/L — ABNORMAL LOW (ref 11–59)

## 2012-12-05 MED ORDER — DEXTROSE 50 % IV SOLN
INTRAVENOUS | Status: AC
  Administered 2012-12-05: 25 mL
  Filled 2012-12-05: qty 50

## 2012-12-05 MED ORDER — SODIUM CHLORIDE 0.9 % IV SOLN
250.0000 mg | Freq: Four times a day (QID) | INTRAVENOUS | Status: DC
  Administered 2012-12-05 – 2012-12-10 (×21): 250 mg via INTRAVENOUS
  Filled 2012-12-05 (×22): qty 250

## 2012-12-05 MED ORDER — ENOXAPARIN SODIUM 30 MG/0.3ML ~~LOC~~ SOLN
30.0000 mg | SUBCUTANEOUS | Status: DC
  Administered 2012-12-05 – 2012-12-10 (×6): 30 mg via SUBCUTANEOUS
  Filled 2012-12-05 (×6): qty 0.3

## 2012-12-05 MED ORDER — VITAL AF 1.2 CAL PO LIQD
1000.0000 mL | ORAL | Status: DC
  Administered 2012-12-05 – 2012-12-06 (×2): 1000 mL
  Filled 2012-12-05 (×8): qty 1000

## 2012-12-05 MED ORDER — ASPIRIN EC 81 MG PO TBEC
81.0000 mg | DELAYED_RELEASE_TABLET | Freq: Every day | ORAL | Status: DC
  Administered 2012-12-06 – 2012-12-07 (×2): 81 mg via ORAL
  Filled 2012-12-05 (×2): qty 1

## 2012-12-05 MED ORDER — FENTANYL CITRATE 0.05 MG/ML IJ SOLN
25.0000 ug/h | INTRAMUSCULAR | Status: DC
  Administered 2012-12-05: 25 ug/h via INTRAVENOUS
  Administered 2012-12-06 – 2012-12-07 (×2): 150 ug/h via INTRAVENOUS
  Administered 2012-12-08: 100 ug/h via INTRAVENOUS
  Filled 2012-12-05 (×4): qty 50

## 2012-12-05 MED ORDER — METOPROLOL TARTRATE 1 MG/ML IV SOLN
2.5000 mg | INTRAVENOUS | Status: DC | PRN
  Administered 2012-12-05 – 2012-12-09 (×2): 5 mg via INTRAVENOUS
  Filled 2012-12-05 (×3): qty 5

## 2012-12-05 MED ORDER — CHLORHEXIDINE GLUCONATE 0.12 % MT SOLN
15.0000 mL | Freq: Two times a day (BID) | OROMUCOSAL | Status: DC
  Administered 2012-12-05 – 2012-12-15 (×19): 15 mL via OROMUCOSAL
  Filled 2012-12-05 (×17): qty 15

## 2012-12-05 MED ORDER — SODIUM CHLORIDE 0.9 % IV SOLN
INTRAVENOUS | Status: DC
  Administered 2012-12-05 – 2012-12-06 (×4): via INTRAVENOUS

## 2012-12-05 MED ORDER — SODIUM CHLORIDE 0.9 % IV BOLUS (SEPSIS)
1000.0000 mL | Freq: Every day | INTRAVENOUS | Status: DC | PRN
  Administered 2012-12-05: 1000 mL via INTRAVENOUS

## 2012-12-05 MED ORDER — VANCOMYCIN HCL IN DEXTROSE 1-5 GM/200ML-% IV SOLN
1000.0000 mg | INTRAVENOUS | Status: DC
  Administered 2012-12-05 – 2012-12-07 (×3): 1000 mg via INTRAVENOUS
  Filled 2012-12-05 (×4): qty 200

## 2012-12-05 MED ORDER — FENTANYL BOLUS VIA INFUSION
25.0000 ug | Freq: Four times a day (QID) | INTRAVENOUS | Status: DC | PRN
  Administered 2012-12-06: 100 ug via INTRAVENOUS
  Administered 2012-12-06: 50 ug via INTRAVENOUS
  Administered 2012-12-07: 100 ug via INTRAVENOUS
  Filled 2012-12-05: qty 100

## 2012-12-05 NOTE — Procedures (Signed)
Mini BAL Procedure Note Natalie Kent [id number] [birth year]  Procedure: Mini Bronchial Alveolar Lavage  Procedure Details: In preparation for procedure, Patient hyper-oxygenated with 100 % FiO2 and Saline given via ETT (60 ml) Sterile Technique used:gloves, gown and mask Amount of Saline administered: 60 (ml) Specimen amount collected: 8 (ml)  Evaluation: BP 120/32  Pulse 95  Temp(Src) 98.6 F (37 C) (Core (Comment))  Resp 28  Ht 5\' 6"  (1.676 m)  Wt 194 lb 10.7 oz (88.3 kg)  BMI 31.43 kg/m2  SpO2 100% O2 sats: stable throughout and currently acceptable Breath Sounds: Clear and Diminished Patient's Current Condition: stable Complications: No apparent complications Patient did tolerate procedure well.   Tawny Hopping F 12/05/2012, 3:06 PM

## 2012-12-05 NOTE — Consult Note (Signed)
PULMONARY  / CRITICAL CARE MEDICINE  Name: Natalie Kent MRN: [id number] DOB: [birth year]    ADMISSION DATE:  Dec 31, 2012 CONSULTATION DATE:  7/10  REFERRING MD :  Ortho  PRIMARY SERVICE: PCCM  CHIEF COMPLAINT:  sepsis    brief:  79yo female with hx HTN, asthma, remote DVT admitted 7/8 for R total hip.  Post op with increasing somnolence, hypotension, abd pain.  7/10 developed worsening hypotension, acute renal failure and encephalopathy though r/t sepsis of unknown etiology and PCCM consulted.  At time of consult pt is altered, very little UOP, SBP 70-80, tender abd, tachypneic. This am lactate 4.5.  No reports of vomiting, diarrhea.      SIGNIFICANT EVENTS / STUDIES:  7/8 R total hip  2D echo 7/10>>>hyperdynamic LV, nml EF, gr 1 diastolic dysfunction, findings s/o HOCM physiology 7/10 CT abd>>>Recent postoperative changes in the right hip and surrounding softtissues. Small bilateral pleural effusions with atelectasis or consolidation in the lung bases. Stool filled colon suggesting constipation. Anterior abdominal wall hernia containing fat with fat stranding suggesting possible fat necrosis.   LINES / TUBES: L IJ CVL 7/10>>>  CULTURES: Urine 7/10>>> BCx2 7/10>>> Results for orders placed during the hospital encounter of 12-31-12  URINE CULTURE     Status: None   Collection Time    12/03/12 11:16 AM      Result Value Range Status   Specimen Description URINE, RANDOM   Final   Special Requests NONE   Final   Culture  Setup Time 12/03/2012 17:13   Final   Colony Count NO GROWTH   Final   Culture NO GROWTH   Final   Report Status 12/04/2012 FINAL   Final  CULTURE, BLOOD (ROUTINE X 2)     Status: None   Collection Time    12/03/12  4:40 PM      Result Value Range Status   Specimen Description BLOOD LEFT ARM   Final   Special Requests BOTTLES DRAWN AEROBIC ONLY 5CC   Final   Culture  Setup Time 12/04/2012 00:10   Final   Culture     Final   Value:        BLOOD CULTURE  RECEIVED NO GROWTH TO DATE CULTURE WILL BE HELD FOR 5 DAYS BEFORE ISSUING A FINAL NEGATIVE REPORT   Report Status PENDING   Incomplete  CULTURE, BLOOD (ROUTINE X 2)     Status: None   Collection Time    12/03/12  4:45 PM      Result Value Range Status   Specimen Description BLOOD LEFT HAND   Final   Special Requests BOTTLES DRAWN AEROBIC ONLY 5CC   Final   Culture  Setup Time 12/04/2012 00:11   Final   Culture     Final   Value:        BLOOD CULTURE RECEIVED NO GROWTH TO DATE CULTURE WILL BE HELD FOR 5 DAYS BEFORE ISSUING A FINAL NEGATIVE REPORT   Report Status PENDING   Incomplete     ANTIBIOTICS: Vancomycin 7/10>>> Primaxin 7/10>>>   SUBJECTIVE/OVERNIGHT/INTERVAL HX    VITAL SIGNS: Temp:  [97.2 F (36.2 C)-101.7 F (38.7 C)] 97.2 F (36.2 C) (07/11 0600) Pulse Rate:  [29-146] 96 (07/11 0753) Resp:  [16-40] 28 (07/11 0753) BP: (67-147)/(34-74) 122/50 mmHg (07/11 0753) SpO2:  [92 %-100 %] 100 % (07/11 0753) Arterial Line BP: (95-147)/(44-89) 131/51 mmHg (07/11 0700) FiO2 (%):  [80 %-100 %] 80 % (07/11 0753) Weight:  [88.3 kg (194 lb  10.7 oz)] 88.3 kg (194 lb 10.7 oz) (07/10 1800) HEMODYNAMICS: CVP:  [9 mmHg-16 mmHg] 14 mmHg VENTILATOR SETTINGS: Vent Mode:  [-] PRVC FiO2 (%):  [80 %-100 %] 80 % Set Rate:  [20 bmp-28 bmp] 28 bmp Vt Set:  [480 mL] 480 mL PEEP:  [5 cmH20-10 cmH20] 10 cmH20 Plateau Pressure:  [23 cmH20-28 cmH20] 24 cmH20 INTAKE / OUTPUT: Intake/Output     07/10 0701 - 07/11 0700 07/11 0701 - 07/12 0700   I.V. (mL/kg) 2456.9 (27.8)    Blood     IV Piggyback 600    Total Intake(mL/kg) 3056.9 (34.6)    Urine (mL/kg/hr) 312 (0.1)    Other 150 (0.1)    Total Output 462     Net +2594.9            PHYSICAL EXAMINATION: General:  Chronically ill appearing female, acutely ill Neuro:  Altered, lethargic at times, moaning, does not follow commands HEENT:  Mm dry, no JVD Cardiovascular:  s1s2 tachy Lungs:  resps even, labored, tachypneic, diminished,  few scattered crackles Abdomen:  Tender diffusely, distended Musculoskeletal:  Cool, pale, no edema   LABS: See  individual assessment and plan  Imaging x48 hours  CXR: Ct Abdomen Pelvis Wo Contrast  12/03/2012   *RADIOLOGY REPORT*  Clinical Data: Generalized abdominal pain.  CT ABDOMEN AND PELVIS WITHOUT CONTRAST  Technique:  Multidetector CT imaging of the abdomen and pelvis was performed following the standard protocol without intravenous contrast.  Comparison: 07/04/2012  Findings: Minimal bilateral pleural effusions with basilar atelectasis or consolidation in both lung bases.  Coronary artery calcification and aortic valve calcification.  Mild cardiac enlargement.  Large esophageal hiatal hernia.  Surgical absence of the gallbladder.  The unenhanced appearance of the liver, spleen, pancreas, adrenal glands, and retroperitoneal lymph nodes is unremarkable.  The inferior vena cava is flattened which may suggest hypovolemia.  Calcified and tortuous aorta without aneurysm.  The kidneys are atrophic without hydronephrosis. Calcification is present in the right renal hilum which could represent a nonobstructing stone or vascular calcifications.  The stomach is decompressed.  Small bowel are not abnormally distended. Diffusely stool filled colon with mild distension suggesting constipation.  No free air or free fluid in the abdomen.  There is an inferior midline anterior abdominal wall hernia containing fat. Herniated fat demonstrates stranding which may represent fat necrosis due to incarceration of the fat.  Pelvis:  Visualization of the low pelvis is limited due to streak artifact arising from right hip prosthesis.  The bladder appears to be decompressed with Foley catheter.  Uterus is surgically absent. No abnormal adnexal masses.  The appendix appears to be surgically absent.  No free or loculated pelvic fluid collections.  Stool filled rectosigmoid colon without evidence of diverticulitis. There is  infiltration in the subcutaneous fat over the right hip with subcutaneous gas collections and skin clips. Mild hematoma in the right iliac this muscle.  This likely represents recent hip surgery change.  No definite abscess.  Scoliosis and degenerative change in the lumbar spine.  No destructive bone lesions are appreciated.  IMPRESSION: Recent postoperative changes in the right hip and surrounding soft tissues.  Small bilateral pleural effusions with atelectasis or consolidation in the lung bases.  Stool filled colon suggesting constipation.  Anterior abdominal wall hernia containing fat with fat stranding suggesting possible fat necrosis.   Original Report Authenticated By: Burman Nieves, M.D.   Ct Head Wo Contrast  12/03/2012   *RADIOLOGY REPORT*  Clinical Data: 79 year old  female with acute encephalopathy.  CT HEAD WITHOUT CONTRAST  Technique:  Contiguous axial images were obtained from the base of the skull through the vertex without contrast.  Comparison: 06/30/2012 and earlier.  Findings: Visualized paranasal sinuses and mastoids are clear.  No acute osseous abnormality identified.  Intermittent mild motion artifact.  No acute orbit or scalp soft tissue findings.  Calcified atherosclerosis at the skull base.  There is a left MCA M1 segment stent re-identified.  Stable cerebral volume.  Chronic left MCA encephalomalacia.  Stable ventricles with ex vacuo change to the left lateral ventricle. No midline shift, mass effect, or evidence of mass lesion.  No evidence of cortically based acute infarction identified.  No acute intracranial hemorrhage identified.  No suspicious intracranial vascular hyperdensity.  IMPRESSION: No acute intracranial abnormality. Stable noncontrast CT appearance of the brain with chronic left MCA infarct and left MCA M1 segment stent.   Original Report Authenticated By: Erskine Speed, M.D.   US Abdomen Complete  12/03/2012   *RADIOLOGY REPORT*  Clinical Data:  Elevated LFTs, acute on  chronic renal failure  ABDOMINAL ULTRASOUND COMPLETE  Comparison:  CT abdomen/pelvis 07/04/2012  Findings:  Gallbladder:  The gallbladder is surgically absent.  Common Bile Duct:  Within normal limits in caliber.  Liver: Heterogeneous hepatic parenchyma without definite echogenicity.  The liver appears slightly hypoechoic prominent echogenicity of the portal triads.  IVC:  Poorly seen secondary to obscuring bowel gas.  Pancreas:  Not well seen secondary to obscuring bowel gas.  Spleen:  Sonographically unremarkable.  Normal in size 6.6 cm.  Right kidney:  Mild renal cortical atrophy.  No hydronephrosis. Extrarenal pelvis noted incidentally. Kidney measures 9.3 cm in length.  Left kidney:  Mild renal cortical atrophy.  No hydronephrosis.  The kidney measures 12.3 cm in length.  Abdominal Aorta:  Not well seen secondary to obscuring bowel gas.  IMPRESSION:  1.  No acute abnormality.  No hydronephrosis. 2.  Slightly hypoechoic hepatic parenchyma with increased echogenicity of the portal triads.  While nonspecific, similar findings can be seen with acute hepatitis. 3.  Mild bilateral renal cortical atrophy. 4.  Surgical changes of cholecystectomy.   Original Report Authenticated By: Malachy Moan, M.D.   Portable Chest Xray In Am  12/05/2012   *RADIOLOGY REPORT*  Clinical Data: Intubation, confirm endotracheal tube placement  PORTABLE CHEST - 1 VIEW  Comparison: Portable exam 0647 hours compared to 12/04/2012  Findings: Tip of endotracheal tube projects 2.9 cm above carina. Severely rotated to the left. Nasogastric tube coiled the stomach. Tip of left jugular central venous catheter projects over SVC. Upper normal heart size. Atherosclerotic calcification aorta. Persistent right basilar consolidation. Atelectasis and effusion in left lower lobe, cannot exclude left lower lobe infiltrate as well.  IMPRESSION: Persistent right basilar consolidation. Pleural effusion and atelectasis at left lung base, cannot exclude  left lower lobe infiltrate.   Original Report Authenticated By: Ulyses Southward, M.D.   Dg Chest Port 1 View  12/04/2012   *RADIOLOGY REPORT*  Clinical Data: Intubation.  PORTABLE CHEST - 1 VIEW  Comparison: 12/04/2012  Findings: Endotracheal tube is approximately 3 cm above the carina. Left central line is unchanged.  NG tube coils in the stomach.  Cardiomegaly with bilateral lower lobe airspace opacities and effusions, increasing on the right since prior study.  IMPRESSION: Endotracheal tube 3 cm above the carina.  Bilateral effusions with bibasilar atelectasis or infiltrates.  Cardiomegaly.   Original Report Authenticated By: Charlett Nose, M.D.   Dg Chest  Portable 1 View  12/04/2012   *RADIOLOGY REPORT*  Clinical Data: Central line placement, asthma.  PORTABLE CHEST - 1 VIEW  Comparison: 12/04/2012  Findings: Left central line is in place with the tip in the SVC. No pneumothorax.  Mild cardiomegaly.  Improving aeration in the right base.  Continued left lower lobe atelectasis or infiltrate with small left effusion.  No acute bony abnormality.  IMPRESSION: Left central line tip in the SVC.  No pneumothorax.  Continued left basilar atelectasis or infiltrate with left effusion.   Original Report Authenticated By: Charlett Nose, M.D.   Dg Chest Port 1 View  12/04/2012   *RADIOLOGY REPORT*  Clinical Data: Short of breath, sepsis  PORTABLE CHEST - 1 VIEW  Comparison: Prior chest x-ray 12/03/2012  Findings: Stable cardiomegaly.  Probable hiatal hernia. Atherosclerotic calcifications in the transverse aorta.  Slightly lower inspiratory volumes with increased bibasilar opacities. Lower volumes results in crowding of the pulmonary vasculature. There may be slightly increased vascular congestion but no overt edema.  No pneumothorax.  IMPRESSION: Lower inspiratory volumes with increased bibasilar opacities and slightly increased pulmonary vascular congestion.   Original Report Authenticated By: Malachy Moan, M.D.   Dg  Chest Port 1 View  12/03/2012   *RADIOLOGY REPORT*  Clinical Data: Postop hip replacement, hypoxemia  PORTABLE CHEST - 1 VIEW  Comparison: Chest x-ray of 11/25/2012  Findings: There is bibasilar atelectasis, with more opacity at the left lung base also suggesting the presence of left pleural effusion.  A two-view chest x-ray would be helpful.  Cardiomegaly is stable.  No acute bony abnormality is seen with degenerative change noted in both shoulders.  IMPRESSION: Diminished aeration with bibasilar atelectasis.  Suspect left pleural effusion as well.  Consider two-view chest x-ray.   Original Report Authenticated By: Dwyane Dee, M.D.   Dg Abd Portable 1v  12/04/2012   *RADIOLOGY REPORT*  Clinical Data: OG tube placement.  Distended abdomen.  PORTABLE ABDOMEN - 1 VIEW  Comparison: CT 12/03/2012  Findings: NG tube coils in the stomach.  The tip is in the body of the stomach.  Large stool burden throughout the colon.  IMPRESSION: NG tube coils in the stomach.   Original Report Authenticated By: Charlett Nose, M.D.     ASSESSMENT / PLAN:  PULMONARY  Recent Labs Lab 12/04/12 1200 12/04/12 1813 12/04/12 2010 12/04/12 2150 12/04/12 2348 12/05/12 0213  PHART 7.343*  --  7.273* 7.194* 7.291*  --   PCO2ART 33.6*  --  31.7* 44.5 33.5*  --   PO2ART 59.2*  --  67.0* 74.0* 88.0  --   HCO3 17.8*  --  14.6* 17.2* 16.1*  --   TCO2 18.8  --  16 18 17   --   O2SAT 89.6 84.1 91.0 91.0 96.0 97.8    Acute resp failure - in setting sepsis, abd distension   12/05/12 - on 80% oxygen and 10 of PEEP and saturating 97%   P:   Mechanical ventilator support  CARDIOVASCULAR   Recent Labs Lab 12/03/12 1119 12/03/12 1649 12/03/12 2242  TROPONINI <0.30 <0.30 <0.30     No results found for this basename: PROBNP,  in the last 168 hours  Shock/hypotension  Severe sepsis  Echo s/o HOCM physiology  12/05/2012: Just came off pressors. Was in septic shock protocol P:  Stress steroids  Finish up septic shock  protocol Monitor CVP ; goal greater than 10  RENAL  Recent Labs Lab 12/03/12 0540 12/03/12 1649 12/04/12 0030 12/04/12 1741 12/05/12 0350  NA 137 135 134* 141 142  K 5.2* 4.8 5.1 4.9 4.5  CL 105 106 106 111 111  CO2 23 19 22  17* 18*  GLUCOSE 143* 87 99 59* 126*  BUN 39* 47* 51* 60* 59*  CREATININE 2.42* 2.64* 2.69* 2.65* 2.29*  CALCIUM 9.3 8.0* 8.3* 8.4 8.0*  MG  --   --   --   --  3.2*  PHOS  --   --   --   --  7.4*   Intake/Output     07/10 0701 - 07/11 0700 07/11 0701 - 07/12 0700   I.V. (mL/kg) 2456.9 (27.8)    Blood     IV Piggyback 600    Total Intake(mL/kg) 3056.9 (34.6)    Urine (mL/kg/hr) 312 (0.1)    Other 150 (0.1)    Total Output 462     Net +2594.9            Acute renal failure - in setting sepsis, hypovolemia +/- meloxicam, hctz . Normal renal ultrasound Metabolic acidosis   12/05/2012: Improving renal failure and metabolic acidosis P:   Avoid nephrotoxic agents and maintain hemodynamics  GASTROINTESTINAL  Recent Labs Lab 12/03/12 1119  AST 116*  ALT 119*  ALKPHOS 60  BILITOT 0.5  PROT 4.4*  ALBUMIN 2.4*   No results found for this basename: INR,  in the last 168 hours  abd pain . Left upper quadrant possibly due to pneumonia. CT abdomen is benign other than stool impaction P:   Check  lipase and CK Start tube feeds  HEMATOLOGIC  Recent Labs Lab 12/04/12 0030 12/04/12 0520 12/04/12 1741 12/05/12 0350  HGB 7.4* 9.4* 8.1* 8.2*  HCT 22.4* 27.1* 23.4* 23.8*  WBC 7.2  --  7.1 9.2  PLT 178  --  151 153   No results found for this basename: INR,  in the last 168 hours  Anemia postoperative and anemia critical illness. Status post 1 unit packed red blood cells on 12/03/2012 P:  F/u cbc  DVT prophylaxis with Lovenox especially in post hip surgery situation Packed red blood cell for hemoglobin less than 7 g percent unless actively bleeding   INFECTIOUS  Recent Labs Lab 12/03/12 0540 12/03/12 1649 12/04/12 0030  12/04/12 1741 12/05/12 0350  WBC 11.8* 10.2 7.2 7.1 9.2    Recent Labs Lab 12/03/12 1649 12/04/12 1741 12/05/12 0353  LATICACIDVEN 4.5* 2.4* 2.3*  PROCALCITON 5.19  --  36.32    Septic shock likely due to left lower lobe pneumonia. Bowel etiology lower in the differential. Left upper quadrant pain probably referred pain of the left lower lobe pneumonia P:   Pan culture  Vanc, imipenem as above  Monitor culture data   ENDOCRINE CBG (last 3)   Recent Labs  12/05/12 0023 12/05/12 0402 12/05/12 0802  GLUCAP 110* 118* 115*     DM P:   ICU hyperglycemia protocol  NEUROLOGIC AMS - multifactorial r/t sepsis and acute renal failure   P:   Supportive care  Sedation protocol with fentanyl infusion and Versed as needed   GLOBAL -  12/04/12: detailed discussion with son & daughter , discussed plan & prognosis  12/05/2012: Discussed with 2 sons and 3 daughters at bedside. Diagnose of pneumonia and septic shock renal failure explained. Acute critical illness and competitions of chronic critical illness explained. Cautious optimism expressed. They are extremely frustrated with physician communication prior to ICU transfer. Critical care service needs to update them on a daily basis. I will  have office of patient experience contact them    The patient is critically ill with multiple organ systems failure and requires high complexity decision making for assessment and support, frequent evaluation and titration of therapies, application of advanced monitoring technologies and extensive interpretation of multiple databases.   Critical Care Time devoted to patient care services described in this note is  60  Minutes.  Dr. Kalman Shan, M.D., [facility].C.P Pulmonary and Critical Care Medicine Staff Physician [facility] Pager: [phone], If no answer or between  15:00h - 7:00h: call [phone]  12/05/2012 9:14 AM

## 2012-12-05 NOTE — Progress Notes (Signed)
Pressure stable now off pressors. Afeb.  Potentially starting vent wean tonight.  Seems somewhat better thanks to critical care team.  Spoke with three family members who expressed understandable anger about situation and how Natalie Kent has done.  I did see some family members when I went by for postop check on Tuesday evening and tried to call her daughter with CT result Tuesday night but got no answer.  Also saw some others on rounds Wednesday morning when I called the medical consult and then some others Thursday in stepdown when it was determined critical care should be involved.  I suppose I certainly could have done more to keep them all informed and I apologized.  Will continue to follow progress obviously.  Fine to sit up OOB if that becomes an issue.

## 2012-12-05 NOTE — Progress Notes (Signed)
Weaned fio2 to 80% in effort to wean oxygen if pt tol.

## 2012-12-05 NOTE — Progress Notes (Signed)
INITIAL NUTRITION ASSESSMENT  DOCUMENTATION CODES Per approved criteria  -Obesity Unspecified   INTERVENTION: 1.  Enteral nutrition; initiate Vital 1.2 @ 10 mL/hr continuous.  Advance by 10 mL q 24 hrs to 50 mL/hr goal to provide 1440 kcal, 90g protein, 973 mL free water.  NUTRITION DIAGNOSIS: Inadequate oral intake related to inability to eat as evidenced by vent, NPO.   Monitor:  1.  Enteral nutrition; Enteral nutrition to provide 60-70% of estimated calorie needs (22-25 kcals/kg ideal body weight) and 100% of estimated protein needs, based on ASPEN guidelines for permissive underfeeding in critically ill obese individuals. 2.  Wt/wt change; monitor trends  Reason for Assessment: consult; enteral initiation and management  79 y.o. female  Admitting Dx: DJD (degenerative joint disease) of hip  ASSESSMENT: Pt presented for hip surgery.  Experienced worsening abdominal pain and septic shock r/t to pneumonia.  Now with respiratory failure. Patient is currently intubated on ventilator support.  MV: 13.5 L/min  Temp:Temp (24hrs), Avg:99.1 F (37.3 C), Min:97.2 F (36.2 C), Max:101.7 F (38.7 C)  Pt with umbilical hernia and fat necrosis.  No bowel involvement per MD note.  Pt with suspected constipation and possible ileus also contributing to abdominal pain.  RD will plan for initiation of trickle feeds of semi-elemental formula with slow advancements to promote tolerance.   Height: Ht Readings from Last 1 Encounters:  12/04/12 5\' 6"  (1.676 m)    Weight: Wt Readings from Last 1 Encounters:  12/04/12 194 lb 10.7 oz (88.3 kg)    Ideal Body Weight: 130 lbs  % Ideal Body Weight: 149%  Wt Readings from Last 10 Encounters:  12/04/12 194 lb 10.7 oz (88.3 kg)  12/04/12 194 lb 10.7 oz (88.3 kg)  11/25/12 168 lb 4.8 oz (76.34 kg)  09/14/12 175 lb (79.379 kg)  06/29/12 179 lb (81.194 kg)  03/18/12 175 lb (79.379 kg)  03/10/12 175 lb 12.8 oz (79.742 kg)  03/06/12 177 lb (80.287  kg)  01/23/11 193 lb (87.544 kg)    Usual Body Weight: 175-180 per chart review  % Usual Body Weight: 110%  BMI:  Body mass index is 31.43 kg/(m^2).  Estimated Nutritional Needs: Kcal: 1300-1480 Protein: >/=118g Fluid: ~1.8 L/day  Skin: intact  Diet Order: NPO  EDUCATION NEEDS: -Education not appropriate at this time   Intake/Output Summary (Last 24 hours) at 12/05/12 1033 Last data filed at 12/05/12 0700  Gross per 24 hour  Intake 2556.88 ml  Output    462 ml  Net 2094.88 ml    Last BM: 7/10  Labs:   Recent Labs Lab 12/04/12 0030 12/04/12 1741 12/05/12 0350  NA 134* 141 142  K 5.1 4.9 4.5  CL 106 111 111  CO2 22 17* 18*  BUN 51* 60* 59*  CREATININE 2.69* 2.65* 2.29*  CALCIUM 8.3* 8.4 8.0*  MG  --   --  3.2*  PHOS  --   --  7.4*  GLUCOSE 99 59* 126*    CBG (last 3)   Recent Labs  12/05/12 0023 12/05/12 0402 12/05/12 0802  GLUCAP 110* 118* 115*    Scheduled Meds: . chlorhexidine  15 mL Mouth/Throat BID  . [START ON 01/01/2013] cyanocobalamin  1,000 mcg Intramuscular Q30 days  . hydrocortisone sod succinate (SOLU-CORTEF) inj  50 mg Intravenous Q6H  . imipenem-cilastatin  250 mg Intravenous Q12H  . pantoprazole (PROTONIX) IV  40 mg Intravenous Daily  . sodium phosphate  1 enema Rectal TID  . vancomycin  1,000 mg  Intravenous Q48H    Continuous Infusions: . sodium chloride    . fentaNYL infusion INTRAVENOUS    . norepinephrine (LEVOPHED) Adult infusion Stopped (12/05/12 0500)  . phenylephrine (NEO-SYNEPHRINE) Adult infusion    . vasopressin (PITRESSIN) infusion - *FOR SHOCK* 0.03 Units/min (12/04/12 2142)    Past Medical History  Diagnosis Date  . DVT (deep venous thrombosis)   . Asthma   . Arthritis   . Hypertension   . Right rib fracture   . Fibromyalgia   . Mental disorder   . Anxiety   . Depression   . Pneumonia     hx  . Peripheral vascular disease 70's    groin blood clot  . GERD (gastroesophageal reflux disease)   . H/O  hiatal hernia   . Anemia     hx  . Dysrhythmia     "irregular" heart rate --- history of sinus arrhythmia  . Stroke 03/2005    Past Surgical History  Procedure Laterality Date  . Abdominal hysterectomy    . Cholecystectomy    . Knee surgery Bilateral   . Endovascular stent insertion      left MCA stent 08/2005, 09/2006, intracranial angioplasty 01/2007  . Eye surgery Bilateral     cat  . Varicose vein surgery    . Total hip arthroplasty Right 01-01-2013    Procedure: TOTAL HIP ARTHROPLASTY ANTERIOR APPROACH;  Surgeon: Velna Ochs, MD;  Location: [facility] OR;  Service: Orthopedics;  Laterality: Right;  DEPUY    Loyce Dys, MS RD LDN Clinical Inpatient Dietitian Pager: [phone] Weekend/After hours pager: [phone]

## 2012-12-05 NOTE — Progress Notes (Signed)
CRITICAL VALUE ALERT  Critical value received: Troponin = 0.37 Date of notification: 12/05/12 Time of notification:  2240 Critical value read back:Yes  Nurse who received alert: Brett Fairy, RN MD notified (1st page):  CCM - Dr Deterding Time of first page: 2242 MD notified (2nd page):  Time of second page:  Responding MD:  Dr. Darrick Penna Time MD responded: 2242

## 2012-12-05 NOTE — Progress Notes (Signed)
Subjective: 3 Days Post-Op Procedure(s) (LRB): TOTAL HIP ARTHROPLASTY ANTERIOR APPROACH (Right) Patient moved to the intensive care unit. She is on the ventilator as well. Being treated with antibiotics for pneumonia and sepsis. Activity level:  Bed rest Diet tolerance:  Not Voiding:  Foley Patient reports pain as mild.    Objective: Vital signs in last 24 hours: Temp:  [97.2 F (36.2 C)-101.7 F (38.7 C)] 97.2 F (36.2 C) (07/11 0600) Pulse Rate:  [29-146] 96 (07/11 0753) Resp:  [16-40] 28 (07/11 0753) BP: (67-147)/(34-74) 122/50 mmHg (07/11 0753) SpO2:  [92 %-100 %] 100 % (07/11 0753) Arterial Line BP: (95-147)/(44-89) 131/51 mmHg (07/11 0700) FiO2 (%):  [80 %-100 %] 80 % (07/11 0753) Weight:  [88.3 kg (194 lb 10.7 oz)] 88.3 kg (194 lb 10.7 oz) (07/10 1800)  Labs:  Recent Labs  12/03/12 1649 12/04/12 0030 12/04/12 0520 12/04/12 1741 12/05/12 0350  HGB 7.7* 7.4* 9.4* 8.1* 8.2*    Recent Labs  12/04/12 1741 12/05/12 0350  WBC 7.1 9.2  RBC 2.78* 2.81*  HCT 23.4* 23.8*  PLT 151 153    Recent Labs  12/04/12 1741 12/05/12 0350  NA 141 142  K 4.9 4.5  CL 111 111  CO2 17* 18*  BUN 60* 59*  CREATININE 2.65* 2.29*  GLUCOSE 59* 126*  CALCIUM 8.4 8.0*   No results found for this basename: LABPT, INR,  in the last 72 hours  Physical Exam:  Neurologically intact Neurovascular intact Intact pulses distally Incision: dressing C/D/I No cellulitis present Compartment soft No apparent pain with motion of right hip and no obvious infection  Assessment/Plan:  3 Days Post-Op Procedure(s) (LRB): TOTAL HIP ARTHROPLASTY ANTERIOR APPROACH (Right) Thanks greatly for medical consult and treatment. SCDs Currently on antibiotics for sepsis/pneumonia Spoke with her family about her condition yesterday.    Armand Preast R 12/05/2012, 8:15 AM

## 2012-12-05 NOTE — Procedures (Signed)
Arterial Line Insertion Procedure Note Natalie Kent [id number] [birth year]  Procedure: Insertion of Arterial Line Indications: BP monitoring, frequent ABG's  Procedure Details Consent: Risks of procedure as well as the alternatives and risks of each were explained to the (patient/caregiver).  Consent for procedure obtained. Time Out: Verified patient identification, verified procedure, site/side was marked, verified correct patient position, special equipment/implants available, medications/allergies/relevent history reviewed, required imaging and test results available.  Performed  Maximum sterile technique was used including antiseptics, cap, gloves, gown, hand hygiene, mask and sheet. Skin prep: Chlorhexidine; local anesthetic administered A single lumen arterial catheter was placed in the R radial artery using the Seldinger technique.   Evaluation Blood flow good Complications: No apparent complications Patient did tolerate procedure well.  Dung Prien 12/05/2012, 12:24 AM

## 2012-12-05 NOTE — Progress Notes (Signed)
Weaned fio2 per ABG results. 

## 2012-12-05 NOTE — Progress Notes (Signed)
PT Cancellation Note  Patient Details Name: Natalie Kent MRN: [id number] DOB: [birth year]   Cancelled Treatment:    Reason Eval/Treat Not Completed: Medical issues which prohibited therapy.  Pt now intubated due to respiratory distress, PNA and septic shock.  Pt currently on bedrest.  Will hold PT today and continue to follow.    Albertus Chiarelli 12/05/2012, 9:34 AM  Jake Shark, PT DPT [phone]

## 2012-12-05 NOTE — Progress Notes (Signed)
eLink Physician-Brief Progress Note Patient Name: Natalie Kent DOB: 21-Jan-1931 MRN: [id number]  Date of Service  12/05/2012   HPI/Events of Note  Patient with metabolic acidosis 7.29/33/88/16 - currently on vent settings of RR28 and peep of 10.  Also with some hypoglycemia with elevated LFTs.   eICU Interventions  Plan: Continue with current vent settings.  F/U ABG in AM Change IVFs to D5NS at 50 cc/hr Continue to monitor blood sugar    Intervention Category Major Interventions: Acid-Base disturbance - evaluation and management  Awad Gladd 12/05/2012, 12:00 AM

## 2012-12-05 NOTE — Progress Notes (Signed)
BAL sample walked down to the lab.

## 2012-12-05 NOTE — Progress Notes (Signed)
CONSULT NOTE - FOLLOW UP  Pharmacy Consult for Vancomycin + Primaxin, lovenox Indication: rule out sepsis, DVT prophylaxis  Allergies  Allergen Reactions  . Solarcaine Aloe Extra (Lidocaine Hcl)   . Septra (Bactrim)   . Cephalexin Rash    Patient Measurements: Height: 5\' 6"  (167.6 cm) Weight: 194 lb 10.7 oz (88.3 kg) IBW/kg (Calculated) : 59.3  Vital Signs: Temp: 97.2 F (36.2 C) (07/11 0600) Temp src: Core (Comment) (07/11 0600) BP: 122/50 mmHg (07/11 0753) Pulse Rate: 96 (07/11 0753) Intake/Output from previous day: 07/10 0701 - 07/11 0700 In: 3056.9 [I.V.:2456.9; IV Piggyback:600] Out: 462 [Urine:312] Intake/Output from this shift:    Labs:  Recent Labs  12/03/12 0540 12/03/12 1116  12/04/12 0030 12/04/12 0520 12/04/12 1741 12/05/12 0350  WBC 11.8*  --   < > 7.2  --  7.1 9.2  HGB 9.3*  --   < > 7.4* 9.4* 8.1* 8.2*  PLT 237  --   < > 178  --  151 153  LABCREA  --  169.96  --   --   --   --   --   CREATININE 2.42*  --   < > 2.69*  --  2.65* 2.29*  < > = values in this interval not displayed. Estimated Creatinine Clearance: 22.3 ml/min (by C-G formula based on Cr of 2.29). No results found for this basename: VANCOTROUGH, VANCOPEAK, VANCORANDOM, GENTTROUGH, GENTPEAK, GENTRANDOM, TOBRATROUGH, TOBRAPEAK, TOBRARND, AMIKACINPEAK, AMIKACINTROU, AMIKACIN,  in the last 72 hours     Assessment: 79 y.o. F who continues on Vancomycin + Primaxin for empiric r/o sepsis coverage. SCr= 2.29 (CrCl ~20) and trend down (SCr= 1.35 on 11/25/12) and patient now off pressors.  Vanc 7/9 >> Primaxin 7/9 >>  7/9 BCx >> ngtd 7/9 UCx >> neg  Patient also noted POD #3 s/p R THA and pharmacy has been asked to dose lovenox for DVT prophylaxis. Renal function is improving. Patient noted with post-op anemia and was transfusion 12/03/08. Hg today= 8.2  Goal of Therapy:  Vancomycin trough level 15-20 mcg/ml Proper antibiotics for infection/cultures adjusted for renal/hepatic function    Plan:  -Change imipenem to 250mg  IV q6h -Change vancomycin to 1000mg  IV q24h -lovenox 30mg  [place] q24h -Will watch renal function closely  Harland German, Pharm D 12/05/2012 10:35 AM

## 2012-12-05 NOTE — Progress Notes (Signed)
eLink Physician-Brief Progress Note Patient Name: Natalie Kent DOB: 1930/07/24 MRN: [id number]  Date of Service  12/05/2012   HPI/Events of Note  Patient with mild elevation in trop in the setting of new onset AF with no RVR.  HD stable.   eICU Interventions  Plan: Trend trop ECHO ordered earlier ASA Consider standing BB dosing   Intervention Category Intermediate Interventions: Best-practice therapies (e.g. DVT, beta blocker, etc.)  DETERDING,ELIZABETH 12/05/2012, 10:44 PM

## 2012-12-05 NOTE — Progress Notes (Signed)
eLink Physician-Brief Progress Note Patient Name: Natalie Kent DOB: Sep 01, 1930 MRN: [id number]  Date of Service  12/05/2012   HPI/Events of Note  Call from nurse reporting new onset AF with rates in the low 100s.  Patient is HD stable.  No reported h/o of AF other than sinus arrythmia.  Recent ECHO on 7/10 shows preserved  EF with no wall motion abnormality.  Patient asymtomatic   eICU Interventions  Plan: Monitor PRN BB for HR control Cycle trop   Intervention Category Intermediate Interventions: Arrhythmia - evaluation and management  DETERDING,ELIZABETH 12/05/2012, 9:36 PM

## 2012-12-05 NOTE — Progress Notes (Signed)
OT Cancellation Note  Patient Details Name: Natalie Kent MRN: [id number] DOB: 04/02/1931   Cancelled Treatment:    Reason Eval/Treat Not Completed: Medical issues which prohibited therapy  [facility] Tomiko Schoon, OTR/L  [phone] 12/05/2012 12/05/2012, 3:17 PM

## 2012-12-06 ENCOUNTER — Inpatient Hospital Stay (HOSPITAL_COMMUNITY): Payer: Medicare Other

## 2012-12-06 DIAGNOSIS — G9340 Encephalopathy, unspecified: Secondary | ICD-10-CM

## 2012-12-06 DIAGNOSIS — J189 Pneumonia, unspecified organism: Secondary | ICD-10-CM

## 2012-12-06 DIAGNOSIS — R109 Unspecified abdominal pain: Secondary | ICD-10-CM

## 2012-12-06 DIAGNOSIS — N179 Acute kidney failure, unspecified: Secondary | ICD-10-CM

## 2012-12-06 DIAGNOSIS — J9600 Acute respiratory failure, unspecified whether with hypoxia or hypercapnia: Secondary | ICD-10-CM

## 2012-12-06 LAB — GLUCOSE, CAPILLARY
Glucose-Capillary: 106 mg/dL — ABNORMAL HIGH (ref 70–99)
Glucose-Capillary: 125 mg/dL — ABNORMAL HIGH (ref 70–99)
Glucose-Capillary: 114 mg/dL — ABNORMAL HIGH (ref 70–99)
Glucose-Capillary: 136 mg/dL — ABNORMAL HIGH (ref 70–99)

## 2012-12-06 LAB — BASIC METABOLIC PANEL
BUN: 64 mg/dL — ABNORMAL HIGH (ref 6–23)
CO2: 18 mEq/L — ABNORMAL LOW (ref 19–32)
Calcium: 8.1 mg/dL — ABNORMAL LOW (ref 8.4–10.5)
GFR calc non Af Amer: 23 mL/min — ABNORMAL LOW (ref >90)
Glucose, Bld: 125 mg/dL — ABNORMAL HIGH (ref 70–99)
Sodium: 144 mEq/L (ref 135–145)

## 2012-12-06 LAB — CBC
HCT: 22.8 % — ABNORMAL LOW (ref 36.0–46.0)
Hemoglobin: 7.7 g/dL — ABNORMAL LOW (ref 12.0–15.0)
MCH: 28.2 pg (ref 26.0–34.0)
MCHC: 33.8 g/dL (ref 30.0–36.0)
MCV: 83.5 fL (ref 78.0–100.0)
RBC: 2.73 MIL/uL — ABNORMAL LOW (ref 3.87–5.11)

## 2012-12-06 LAB — TROPONIN I: Troponin I: <0.3 ng/mL (ref <0.30)

## 2012-12-06 MED ORDER — SENNOSIDES-DOCUSATE SODIUM 8.6-50 MG PO TABS
1.0000 | ORAL_TABLET | Freq: Two times a day (BID) | ORAL | Status: DC
  Administered 2012-12-06 – 2012-12-08 (×3): 1 via ORAL
  Filled 2012-12-06 (×5): qty 1

## 2012-12-06 MED ORDER — DEXMEDETOMIDINE HCL IN NACL 200 MCG/50ML IV SOLN
0.4000 ug/kg/h | INTRAVENOUS | Status: DC
  Administered 2012-12-06: 0.4 ug/kg/h via INTRAVENOUS
  Filled 2012-12-06: qty 50

## 2012-12-06 MED ORDER — LACTULOSE 10 GM/15ML PO SOLN
10.0000 g | Freq: Three times a day (TID) | ORAL | Status: DC
  Administered 2012-12-06 – 2012-12-08 (×7): 10 g via ORAL
  Filled 2012-12-06 (×13): qty 15

## 2012-12-06 MED ORDER — FUROSEMIDE 10 MG/ML IJ SOLN
40.0000 mg | Freq: Two times a day (BID) | INTRAMUSCULAR | Status: DC
  Administered 2012-12-06 – 2012-12-08 (×5): 40 mg via INTRAVENOUS
  Filled 2012-12-06 (×5): qty 4

## 2012-12-06 MED ORDER — FUROSEMIDE 10 MG/ML IJ SOLN
INTRAMUSCULAR | Status: AC
  Filled 2012-12-06: qty 4

## 2012-12-06 NOTE — Progress Notes (Signed)
PULMONARY  / CRITICAL CARE MEDICINE  Name: Natalie Kent MRN: [id number] DOB: 1930/08/28    ADMISSION DATE:  12/13/2012 CONSULTATION DATE:  7/10  REFERRING MD :  Ortho  PRIMARY SERVICE: PCCM  CHIEF COMPLAINT:  sepsis    brief:  79yo female with hx HTN, asthma, remote DVT admitted 7/8 for R total hip.  Post op with increasing somnolence, hypotension, abd pain.  7/10 developed worsening hypotension, acute renal failure and encephalopathy though r/t sepsis of unknown etiology and PCCM consulted.  At time of consult pt is altered, very little UOP, SBP 70-80, tender abd, tachypneic. This am lactate 4.5.  No reports of vomiting, diarrhea.      SIGNIFICANT EVENTS / STUDIES:  7/8 R total hip  2D echo 7/10>>>hyperdynamic LV, nml EF, gr 1 diastolic dysfunction, findings s/o HOCM physiology 7/10 CT abd>>>Recent postoperative changes in the right hip and surrounding softtissues. Small bilateral pleural effusions with atelectasis or consolidation in the lung bases. Stool filled colon suggesting constipation. Anterior abdominal wall hernia containing fat with fat stranding suggesting possible fat necrosis.   LINES / TUBES: L IJ CVL 7/10>>>  CULTURES: Urine 7/10>>> BCx2 7/10>>> Results for orders placed during the hospital encounter of 12/08/2012  URINE CULTURE     Status: None   Collection Time    12/03/12 11:16 AM      Result Value Range Status   Specimen Description URINE, RANDOM   Final   Special Requests NONE   Final   Culture  Setup Time 12/03/2012 17:13   Final   Colony Count NO GROWTH   Final   Culture NO GROWTH   Final   Report Status 12/04/2012 FINAL   Final  CULTURE, BLOOD (ROUTINE X 2)     Status: None   Collection Time    12/03/12  4:40 PM      Result Value Range Status   Specimen Description BLOOD LEFT ARM   Final   Special Requests BOTTLES DRAWN AEROBIC ONLY 5CC   Final   Culture  Setup Time 12/04/2012 00:10   Final   Culture     Final   Value:        BLOOD CULTURE  RECEIVED NO GROWTH TO DATE CULTURE WILL BE HELD FOR 5 DAYS BEFORE ISSUING A FINAL NEGATIVE REPORT   Report Status PENDING   Incomplete  CULTURE, BLOOD (ROUTINE X 2)     Status: None   Collection Time    12/03/12  4:45 PM      Result Value Range Status   Specimen Description BLOOD LEFT HAND   Final   Special Requests BOTTLES DRAWN AEROBIC ONLY 5CC   Final   Culture  Setup Time 12/04/2012 00:11   Final   Culture     Final   Value:        BLOOD CULTURE RECEIVED NO GROWTH TO DATE CULTURE WILL BE HELD FOR 5 DAYS BEFORE ISSUING A FINAL NEGATIVE REPORT   Report Status PENDING   Incomplete     ANTIBIOTICS: Vancomycin 7/10>>> Primaxin 7/10>>>   SUBJECTIVE/OVERNIGHT/INTERVAL HX  12/06/12: Off pressors and 24 hours. Improving ventilator parameters. Content tube feeds. Improved renal function. However, appears to be in abdominal pain and requiring fentanyl infusion. Pain resulting in agitation. Spontaneous breathing trial  started. For A. fib overnight  VITAL SIGNS: Temp:  [97.5 F (36.4 C)-99 F (37.2 C)] 97.7 F (36.5 C) (07/12 0400) Pulse Rate:  [31-116] 87 (07/12 0600) Resp:  [17-29] 28 (07/12 0600) BP: (  90-146)/(32-58) 146/58 mmHg (07/12 0348) SpO2:  [84 %-100 %] 100 % (07/12 0600) Arterial Line BP: (86-164)/(26-61) 133/58 mmHg (07/12 0600) FiO2 (%):  [40 %-80 %] 40 % (07/12 0803) Weight:  [94.4 kg (208 lb 1.8 oz)] 94.4 kg (208 lb 1.8 oz) (07/12 0434) HEMODYNAMICS: CVP:  [4 mmHg-17 mmHg] 17 mmHg VENTILATOR SETTINGS: Vent Mode:  [-] PRVC FiO2 (%):  [40 %-80 %] 40 % Set Rate:  [28 bmp] 28 bmp Vt Set:  [480 mL] 480 mL PEEP:  [5 cmH20-10 cmH20] 5 cmH20 Plateau Pressure:  [19 cmH20-26 cmH20] 26 cmH20 INTAKE / OUTPUT: Intake/Output     07/11 0701 - 07/12 0700 07/12 0701 - 07/13 0700   I.V. (mL/kg) 2170.8 (23)    NG/GT 450    IV Piggyback 1400    Total Intake(mL/kg) 4020.8 (42.6)    Urine (mL/kg/hr) 532 (0.2)    Other     Total Output 532     Net +3488.8             PHYSICAL EXAMINATION: General:  Chronically ill appearing female, acutely ill Neuro: RASS patient's course was to on a fentanyl infusion. She is grimacing due to pain HEENT:  Mm dry, no JVD Cardiovascular:  s1s2 tachy Lungs:  resps even, labored, tachypneic, diminished, few scattered crackles Abdomen:  Soft, tender in the left upper quadrant as before  Musculoskeletal:  Cool, , no edema   LABS: See  individual assessment and plan  Imaging x48 hours  CXR: Dg Chest Port 1 View  12/05/2012   *RADIOLOGY REPORT*  Clinical Data: Post thoracentesis  PORTABLE CHEST - 1 VIEW  Comparison: Portable chest x-ray of 12/05/2012.  Findings:  The tip of the endotracheal tube is approximally 2.3 cm above the carina.  There does appear to have been reduction in the volume of the right pleural effusion.  The side of the thoracentesis was not given.  Left pleural effusion remains with bibasilar atelectasis present.  No pneumothorax is seen.  A left central venous line tip overlies the mid SVC.  IMPRESSION:  1.  Apparent reduction in volume of the right pleural effusion.  No pneumothorax post thoracentesis. 2.  Left pleural effusion and bibasilar atelectasis remain. 3.  Tip of endotracheal tube 2.3 cm above the carina.   Original Report Authenticated By: Dwyane Dee, M.D.   Portable Chest Xray In Am  12/05/2012   *RADIOLOGY REPORT*  Clinical Data: Intubation, confirm endotracheal tube placement  PORTABLE CHEST - 1 VIEW  Comparison: Portable exam 0647 hours compared to 12/04/2012  Findings: Tip of endotracheal tube projects 2.9 cm above carina. Severely rotated to the left. Nasogastric tube coiled the stomach. Tip of left jugular central venous catheter projects over SVC. Upper normal heart size. Atherosclerotic calcification aorta. Persistent right basilar consolidation. Atelectasis and effusion in left lower lobe, cannot exclude left lower lobe infiltrate as well.  IMPRESSION: Persistent right basilar consolidation.  Pleural effusion and atelectasis at left lung base, cannot exclude left lower lobe infiltrate.   Original Report Authenticated By: Ulyses Southward, M.D.   Dg Chest Port 1 View  12/04/2012   *RADIOLOGY REPORT*  Clinical Data: Intubation.  PORTABLE CHEST - 1 VIEW  Comparison: 12/04/2012  Findings: Endotracheal tube is approximately 3 cm above the carina. Left central line is unchanged.  NG tube coils in the stomach.  Cardiomegaly with bilateral lower lobe airspace opacities and effusions, increasing on the right since prior study.  IMPRESSION: Endotracheal tube 3 cm above the carina.  Bilateral  effusions with bibasilar atelectasis or infiltrates.  Cardiomegaly.   Original Report Authenticated By: Charlett Nose, M.D.   Dg Chest Portable 1 View  12/04/2012   *RADIOLOGY REPORT*  Clinical Data: Central line placement, asthma.  PORTABLE CHEST - 1 VIEW  Comparison: 12/04/2012  Findings: Left central line is in place with the tip in the SVC. No pneumothorax.  Mild cardiomegaly.  Improving aeration in the right base.  Continued left lower lobe atelectasis or infiltrate with small left effusion.  No acute bony abnormality.  IMPRESSION: Left central line tip in the SVC.  No pneumothorax.  Continued left basilar atelectasis or infiltrate with left effusion.   Original Report Authenticated By: Charlett Nose, M.D.   Dg Abd Portable 1v  12/04/2012   *RADIOLOGY REPORT*  Clinical Data: OG tube placement.  Distended abdomen.  PORTABLE ABDOMEN - 1 VIEW  Comparison: CT 12/03/2012  Findings: NG tube coils in the stomach.  The tip is in the body of the stomach.  Large stool burden throughout the colon.  IMPRESSION: NG tube coils in the stomach.   Original Report Authenticated By: Charlett Nose, M.D.     ASSESSMENT / PLAN:  PULMONARY  Recent Labs Lab 12/04/12 2010 12/04/12 2150 12/04/12 2348 12/05/12 0213 12/05/12 1144 12/05/12 2337  PHART 7.273* 7.194* 7.291*  --  7.375 7.403  PCO2ART 31.7* 44.5 33.5*  --  30.4* 29.3*   PO2ART 67.0* 74.0* 88.0  --  169.0* 169.0*  HCO3 14.6* 17.2* 16.1*  --  17.8* 18.2*  TCO2 16 18 17   --  19 19  O2SAT 91.0 91.0 96.0 97.8 100.0 100.0    Acute resp failure - in setting sepsis, abd distension   12/06/2012: Meets spontaneous breathing trial criteria and just started on it. However does not meet extubation criteria due to pain and agitation  P:   Mechanical ventilator support Spontaneous breathing trial as tolerated  CARDIOVASCULAR    Recent Labs Lab 12/03/12 1119 12/03/12 1649 12/03/12 2242 12/05/12 2137 12/06/12 0400  TROPONINI <0.30 <0.30 <0.30 0.37* 0.35*      Recent Labs Lab 12/05/12 0918  PROBNP 7718.0*    Shock/hypotension  Severe sepsis  Echo s/o HOCM physiology on 12/04/2012  12/06/2012: Off pressors 24 hours. New onset atrial fibrillation overnight possibly due to diastolic dysfunction. Mild troponin leak. High BNP suggesting diastolic dysfunction.   P:  Discontinue Stress steroids and septic shock protocol Goal CVP greater than 10 Mean arterial pressure goal greater than 65 Start Lasix and Dependent on course might need cardiology consultation and rate control  RENAL  Recent Labs Lab 12/03/12 1649 12/04/12 0030 12/04/12 1741 12/05/12 0350 12/06/12 0400  NA 135 134* 141 142 144  K 4.8 5.1 4.9 4.5 3.5  CL 106 106 111 111 114*  CO2 19 22 17* 18* 18*  GLUCOSE 87 99 59* 126* 125*  BUN 47* 51* 60* 59* 64*  CREATININE 2.64* 2.69* 2.65* 2.29* 1.98*  CALCIUM 8.0* 8.3* 8.4 8.0* 8.1*  MG  --   --   --  3.2*  --   PHOS  --   --   --  7.4*  --    Intake/Output     07/11 0701 - 07/12 0700 07/12 0701 - 07/13 0700   I.V. (mL/kg) 2170.8 (23)    NG/GT 450    IV Piggyback 1400    Total Intake(mL/kg) 4020.8 (42.6)    Urine (mL/kg/hr) 532 (0.2)    Other     Total Output 532  Net +3488.8            Acute renal failure - in setting sepsis, hypovolemia +/- meloxicam, hctz . Normal renal ultrasound Metabolic acidosis   07/1122014:  Improving renal failure and metabolic acidosis P:   Avoid nephrotoxic agents and maintain hemodynamics  GASTROINTESTINAL  Recent Labs Lab 12/03/12 1119 12/05/12 0921  AST 116*  --   ALT 119*  --   ALKPHOS 60  --   BILITOT 0.5  --   PROT 4.4*  --   ALBUMIN 2.4*  --   INR  --  1.58*    Recent Labs Lab 12/05/12 0921  INR 1.58*    12/06/2012 : abd pain. Left upper quadrant tenderness still persists  CT abdomen is benign other than stool impaction. Differential diagnosis now stool impaction and not pneumonia. Tolerating tube feeds x24 hours. Normal lipase but CPK elevated at 495   P:   Continue  tube feeds Recheck CK Start senna docusate and  lactulose  HEMATOLOGIC  Recent Labs Lab 12/04/12 1741 12/05/12 0350 12/06/12 0400  HGB 8.1* 8.2* 7.7*  HCT 23.4* 23.8* 22.8*  WBC 7.1 9.2 8.9  PLT 151 153 111*    Recent Labs Lab 12/05/12 0921  INR 1.58*    Anemia postoperative and anemia critical illness. Status post 1 unit packed red blood cells on 12/03/2012 P:  F/u cbc  DVT prophylaxis with Lovenox especially in post hip surgery situation Packed red blood cell for hemoglobin less than 7 g percent unless actively bleeding   INFECTIOUS  Recent Labs Lab 12/03/12 1649 12/04/12 0030 12/04/12 1741 12/05/12 0350 12/06/12 0400  WBC 10.2 7.2 7.1 9.2 8.9    Recent Labs Lab 12/03/12 1649 12/04/12 1741 12/05/12 0353 12/06/12 0400  LATICACIDVEN 4.5* 2.4* 2.3* 1.3  PROCALCITON 5.19  --  36.32  --     Septic shock likely due to left lower lobe pneumonia. Bowel etiology lower in the differential. Left upper quadrant pain probably referred pain of the left lower lobe pneumonia  12/06/2012: Afebrile and normal white count and off pressors    P:   Pan culture  Vanc, imipenem as above  Monitor culture data and sepsis biomarkers; recheck pro-CAL  ENDOCRINE CBG (last 3)   Recent Labs  12/05/12 2352 12/06/12 0355 12/06/12 0854  GLUCAP 106* 125* 125*      DM P:   ICU hyperglycemia protocol  NEUROLOGIC AMS - multifactorial r/t sepsis and acute renal failure  12/06/2012: Appears to be in pain while on fentanyl infusion and agitated  P:   Supportive care  Sedation protocol with fentanyl infusion and Versed as needed  Consider Precedex if agitation continues  GLOBAL -  12/04/12: detailed discussion with son & daughter , discussed plan & prognosis  12/05/2012: Discussed with 2 sons and 3 daughters at bedside. Diagnose of pneumonia and septic shock renal failure explained. Acute critical illness and competitions of chronic critical illness explained. Cautious optimism expressed. They are extremely frustrated with physician communication prior to ICU transfer. Critical care service needs to update them on a daily basis. I will have office of patient experience contact them  12/06/2012: Children x 3 updated the bedside with nurse at meeting    The patient is critically ill with multiple organ systems failure and requires high complexity decision making for assessment and support, frequent evaluation and titration of therapies, application of advanced monitoring technologies and extensive interpretation of multiple databases.   Critical Care Time devoted to patient care services  described in this note is  38  Minutes  Dr. Kalman Shan, M.D., [facility].C.P Pulmonary and Critical Care Medicine Staff Physician [facility] Pager: [phone], If no answer or between  15:00h - 7:00h: call [phone]  12/06/2012 9:44 AM

## 2012-12-06 NOTE — Progress Notes (Signed)
Subjective: 4 Days Post-Op Procedure(s) (LRB): TOTAL HIP ARTHROPLASTY ANTERIOR APPROACH (Right) Sepsis. Hypotension. 2 renal failure Activity level:  Currently is on the ventilator at bedrest. Diet tolerance:  not Voiding: Foley catheter Patient reports pain as 1 on 0-10 scale.    Objective: Vital signs in last 24 hours: Temp:  [97.5 F (36.4 C)-99 F (37.2 C)] 97.7 F (36.5 C) (07/12 0400) Pulse Rate:  [49-116] 87 (07/12 0600) Resp:  [17-29] 28 (07/12 0600) BP: (90-146)/(32-58) 146/58 mmHg (07/12 0348) SpO2:  [84 %-100 %] 100 % (07/12 0600) Arterial Line BP: (86-164)/(26-61) 133/58 mmHg (07/12 0600) FiO2 (%):  [40 %-80 %] 40 % (07/12 0945) Weight:  [94.4 kg (208 lb 1.8 oz)] 94.4 kg (208 lb 1.8 oz) (07/12 0434)  Labs:  Recent Labs  12/04/12 0030 12/04/12 0520 12/04/12 1741 12/05/12 0350 12/06/12 0400  HGB 7.4* 9.4* 8.1* 8.2* 7.7*    Recent Labs  12/05/12 0350 12/06/12 0400  WBC 9.2 8.9  RBC 2.81* 2.73*  HCT 23.8* 22.8*  PLT 153 111*    Recent Labs  12/05/12 0350 12/06/12 0400  NA 142 144  K 4.5 3.5  CL 111 114*  CO2 18* 18*  BUN 59* 64*  CREATININE 2.29* 1.98*  GLUCOSE 126* 125*  CALCIUM 8.0* 8.1*    Recent Labs  12/05/12 0921  INR 1.58*    Physical Exam:  Intact pulses distally Incision: scant drainage No cellulitis present Compartment soft Dressing to be changed as needed.  Assessment/Plan:  4 Days Post-Op Procedure(s) (LRB): TOTAL HIP ARTHROPLASTY ANTERIOR APPROACH (Right) Thera continues on the ventilator. Continues with antibiotics. May change right hip dressing as needed. Appreciate medical consult. Spoke with her family members last night and this morning      Marnesha Gagen R 12/06/2012, 10:58 AM

## 2012-12-06 NOTE — Procedures (Signed)
Name:  JELISA Fosston MRN:  [id number] DOB:  08/06/1930  PROCEDURE NOTE  Procedure:  Ultrasound-guided central venous catheter placement.  Indications:  Need for intravenous access and hemodynamic monitoring.  Consent:  Consent signed and in the chart.  Anesthesia:  A total of 10 mL of 1% Lidocaine was used for local infiltration anesthesia.  Procedure summary:  Appropriate equipment was assembled.  The patient was identified as Immunologist and safety timeout was performed. The patient was placed in Trendelenburg position.  Sterile technique was used. The patient's right neck region was prepped using chlorhexidine / alcohol scrub and the field was draped in usual sterile fashion with full body drape. The right internal jugular vein and the right carotid artery were identified by ultrasound, the patency was evaluated and images were documented. After the adequate anesthesia was achieved, the vein was cannulated with the introducer needle under sonographic guidance without difficulty. A guide wire was advanced through the introducer needle, which was then withdrawn. A small skin incision was made at the point of wire entry, the dilator was inserted over the guide wire and appropriate dilation was obtained. The dilator was removed and a [place] triple-lumen catheter was advanced over the guide wire, which was then removed.  All ports were aspirated and flushed with normal saline without difficulty. The catheter was secured into place with sutures at 15 cm. Antibiotic patch was placed and sterile dressing was applied. Post-procedure chest x-ray was ordered.  Complications:  No immediate complications were noted.  Hemodynamic parameters and oxygenation remained stable throughout the procedure.  Estimated blood loss:  Less then 5 mL.  Leontine Locket, M.D. Pulmonary and Critical Care Medicine [facility]  12/06/2012, 9:00 PM

## 2012-12-06 NOTE — Progress Notes (Signed)
[facility] ADULT ICU REPLACEMENT PROTOCOL FOR AM LAB REPLACEMENT ONLY  The patient does not apply for the [facility] Adult ICU Electrolyte Replacment Protocol based on the criteria listed below:   3. Is BUN < 60 mg/dL? no  Patient's BUN today is 64  Aeisha Minarik P 12/06/2012 5:11 AM

## 2012-12-06 NOTE — Progress Notes (Signed)
Physical Therapy Treatment Patient Details Name: Natalie Kent MRN: [id number] DOB: February 04, 1931 Today's Date: 12/06/2012 Time: 5784-6962 PT Time Calculation (min): 16 min  PT Assessment / Plan / Recommendation  PT Comments   Pt now on vent due to sepsis.  However pt able to open eyes when spoken too.  Attempted pt to follow commands however too lethargic at this time.  Performed PROM on bilateral LE to keep joints loose especially on right LE due to R THA.   Follow Up Recommendations  CIR     Equipment Recommendations  None recommended by PT    Frequency 7X/week   Progress towards PT Goals Progress towards PT goals: Not progressing toward goals - comment (due to now vented due to sepsis)  Plan Current plan remains appropriate    Precautions / Restrictions Precautions Precautions: Fall;Anterior Hip Restrictions Weight Bearing Restrictions: Yes RLE Weight Bearing: Weight bearing as tolerated   Pertinent Vitals/Pain Unable to rate    Mobility  Bed Mobility Bed Mobility: Not assessed Transfers Transfers: Not assessed Ambulation/Gait Ambulation/Gait Assistance: Not tested (comment)    Exercises Total Joint Exercises Ankle Circles/Pumps: PROM;Both;20 reps Heel Slides: PROM;Both;20 reps;Supine Hip ABduction/ADduction: PROM;Both;20 reps   PT Diagnosis:    PT Problem List:   PT Treatment Interventions:     PT Goals (current goals can now be found in the care plan section) Acute Rehab PT Goals Patient Stated Goal: Unable due to lethargy and cogntive status PT Goal Formulation: With patient/family Time For Goal Achievement: 12/09/12 Potential to Achieve Goals: Fair  Visit Information  Last PT Received On: 12/06/12 Assistance Needed: +2 History of Present Illness: s/p elective R THA anterior approach, pt now with septic shock, UTI, acute kidney injury    Subjective Data  Subjective: nonverbal on vent Patient Stated Goal: Unable due to lethargy and cogntive status    Cognition  Cognition Arousal/Alertness: Lethargic Overall Cognitive Status: Difficult to assess Difficult to assess due to: Level of arousal (Pt sedated on vent at this time)    Balance     End of Session PT - End of Session Equipment Utilized During Treatment: Other (comment) (Vent) Activity Tolerance: Patient limited by lethargy Patient left: in bed;with call bell/phone within reach;with family/visitor present Nurse Communication: Mobility status   GP     Rodriquez Thorner 12/06/2012, 1:17 PM Jake Shark, PT DPT [phone]

## 2012-12-06 NOTE — Progress Notes (Signed)
Placed on PSV 10/5 40% for one hour per MD's request.

## 2012-12-07 ENCOUNTER — Inpatient Hospital Stay (HOSPITAL_COMMUNITY): Payer: Medicare Other

## 2012-12-07 LAB — GLUCOSE, CAPILLARY
Glucose-Capillary: 105 mg/dL — ABNORMAL HIGH (ref 70–99)
Glucose-Capillary: 82 mg/dL (ref 70–99)
Glucose-Capillary: 89 mg/dL (ref 70–99)
Glucose-Capillary: 98 mg/dL (ref 70–99)

## 2012-12-07 LAB — CBC
HCT: 23.5 % — ABNORMAL LOW (ref 36.0–46.0)
HCT: 21.3 % — ABNORMAL LOW (ref 36.0–46.0)
MCH: 29.1 pg (ref 26.0–34.0)
MCHC: 34.5 g/dL (ref 30.0–36.0)
MCV: 84.9 fL (ref 78.0–100.0)
Platelets: 117 10*3/uL — ABNORMAL LOW (ref 150–400)
RBC: 2.51 MIL/uL — ABNORMAL LOW (ref 3.87–5.11)
RDW: 16.4 % — ABNORMAL HIGH (ref 11.5–15.5)
RDW: 16.7 % — ABNORMAL HIGH (ref 11.5–15.5)

## 2012-12-07 LAB — COMPREHENSIVE METABOLIC PANEL
ALT: 90 U/L — ABNORMAL HIGH (ref 0–35)
AST: 93 U/L — ABNORMAL HIGH (ref 0–37)
Albumin: 1.5 g/dL — ABNORMAL LOW (ref 3.5–5.2)
Alkaline Phosphatase: 112 U/L (ref 39–117)
Chloride: 117 mEq/L — ABNORMAL HIGH (ref 96–112)
Potassium: 3.6 mEq/L (ref 3.5–5.1)
Sodium: 148 mEq/L — ABNORMAL HIGH (ref 135–145)
Total Bilirubin: 0.7 mg/dL (ref 0.3–1.2)
Total Protein: 4.4 g/dL — ABNORMAL LOW (ref 6.0–8.3)

## 2012-12-07 LAB — PROCALCITONIN: Procalcitonin: 16.52 ng/mL

## 2012-12-07 LAB — PROTIME-INR
INR: 1.2 (ref 0.00–1.49)
Prothrombin Time: 14.9 seconds (ref 11.6–15.2)

## 2012-12-07 LAB — CULTURE, BAL-QUANTITATIVE W GRAM STAIN

## 2012-12-07 LAB — BASIC METABOLIC PANEL
Chloride: 116 mEq/L — ABNORMAL HIGH (ref 96–112)
GFR calc Af Amer: 29 mL/min — ABNORMAL LOW (ref >90)
Potassium: 3.2 mEq/L — ABNORMAL LOW (ref 3.5–5.1)

## 2012-12-07 LAB — CK: Total CK: 233 U/L — ABNORMAL HIGH (ref 7–177)

## 2012-12-07 LAB — PHOSPHORUS: Phosphorus: 6.7 mg/dL — ABNORMAL HIGH (ref 2.3–4.6)

## 2012-12-07 MED ORDER — HALOPERIDOL LACTATE 5 MG/ML IJ SOLN
5.0000 mg | Freq: Four times a day (QID) | INTRAMUSCULAR | Status: DC
  Administered 2012-12-07 – 2012-12-08 (×2): 5 mg via INTRAVENOUS
  Filled 2012-12-07 (×7): qty 1

## 2012-12-07 MED ORDER — POTASSIUM CHLORIDE 20 MEQ/15ML (10%) PO LIQD
40.0000 meq | Freq: Once | ORAL | Status: AC
  Administered 2012-12-07: 40 meq
  Filled 2012-12-07: qty 30

## 2012-12-07 MED ORDER — DEXTROSE 50 % IV SOLN
25.0000 mL | Freq: Once | INTRAVENOUS | Status: AC | PRN
  Administered 2012-12-07: 25 mL via INTRAVENOUS
  Filled 2012-12-07: qty 50

## 2012-12-07 MED ORDER — SODIUM CHLORIDE 0.9 % IV SOLN
INTRAVENOUS | Status: DC
  Administered 2012-12-07: 01:00:00 via INTRAVENOUS

## 2012-12-07 MED ORDER — POTASSIUM CHLORIDE 20 MEQ/15ML (10%) PO LIQD
ORAL | Status: AC
  Administered 2012-12-07: 20 meq
  Filled 2012-12-07: qty 30

## 2012-12-07 MED ORDER — PHENYLEPHRINE HCL 10 MG/ML IJ SOLN
30.0000 ug/min | INTRAVENOUS | Status: DC
  Administered 2012-12-08: 30 ug/min via INTRAVENOUS
  Filled 2012-12-07: qty 1

## 2012-12-07 MED ORDER — SODIUM CHLORIDE 0.9 % IV SOLN
INTRAVENOUS | Status: DC
  Administered 2012-12-09: 10 mL/h via INTRAVENOUS

## 2012-12-07 NOTE — Progress Notes (Signed)
PULMONARY  / CRITICAL CARE MEDICINE  Name: Natalie Kent MRN: [id number] DOB: 24-Jul-1930    ADMISSION DATE:  12/01/2012 CONSULTATION DATE:  7/10  REFERRING MD :  Ortho  PRIMARY SERVICE: PCCM  CHIEF COMPLAINT:  sepsis    brief:  79yo female with hx HTN, asthma, remote DVT admitted 7/8 for R total hip.  Post op with increasing somnolence, hypotension, abd pain.  7/10 developed worsening hypotension, acute renal failure and encephalopathy though r/t sepsis of unknown etiology and PCCM consulted.  At time of consult pt is altered, very little UOP, SBP 70-80, tender abd, tachypneic. This am lactate 4.5.  No reports of vomiting, diarrhea.      LINES / TUBES: L IJ CVL 7/10>>>  CULTURES:\ MRSA PCR 11/25/12 - Positive MSSA PCR 11/25/12 - Positive Urine 11/25/12 - strep agalactiae ........... Urine 7/10>>>neg BCx2 7/10>>> neg as of 12/07/12      ANTIBIOTICS: Vancomycin 7/10>>> Primaxin 7/10>>>    SIGNIFICANT EVENTS / STUDIES:  7/8 R total hip  2D echo 7/10>>>hyperdynamic LV, nml EF, gr 1 diastolic dysfunction, findings s/o HOCM physiology 7/10 CT abd>>>Recent postoperative changes in the right hip and surrounding softtissues. Small bilateral pleural effusions with atelectasis or consolidation in the lung bases. Stool filled colon suggesting constipation. Anterior abdominal wall hernia containing fat with fat stranding suggesting possible fat necrosis.    12/06/12: Off pressors and 24 hours. Improving ventilator parameters. Content tube feeds. Improved renal function. However, appears to be in abdominal pain and requiring fentanyl infusion. Pain resulting in agitation. Spontaneous breathing trial  started. For A. fib overnight   SUBJECTIVE/OVERNIGHT/INTERVAL HX 12/07/2012: Still off pressors. Significant agitation. Failed Precedex due to hypotension. Despite lactulose and docusate still constipated   VITAL SIGNS: Temp:  [97.9 F (36.6 C)-99 F (37.2 C)] 99 F (37.2 C) (07/13  0800) Pulse Rate:  [25-140] 104 (07/13 0855) Resp:  [15-25] 19 (07/13 0900) BP: (63-155)/(38-92) 155/52 mmHg (07/13 0900) SpO2:  [92 %-100 %] 100 % (07/13 0855) Arterial Line BP: (173-188)/(62-65) 176/65 mmHg (07/12 1500) FiO2 (%):  [30 %-40 %] 30 % (07/13 0855) Weight:  [94.9 kg (209 lb 3.5 oz)] 94.9 kg (209 lb 3.5 oz) (07/13 0400) HEMODYNAMICS: CVP:  [9 mmHg-13 mmHg] 9 mmHg VENTILATOR SETTINGS: Vent Mode:  [-] PSV;CPAP FiO2 (%):  [30 %-40 %] 30 % Set Rate:  [20 bmp] 20 bmp Vt Set:  [480 mL] 480 mL PEEP:  [5 cmH20] 5 cmH20 Pressure Support:  [8 cmH20] 8 cmH20 Plateau Pressure:  [17 cmH20-23 cmH20] 18 cmH20 INTAKE / OUTPUT: Intake/Output     07/12 0701 - 07/13 0700 07/13 0701 - 07/14 0700   I.V. (mL/kg) 1673.7 (17.6) 105 (1.1)   NG/GT 1445    IV Piggyback 700    Total Intake(mL/kg) 3818.7 (40.2) 105 (1.1)   Urine (mL/kg/hr) 1970 (0.9) 220 (0.5)   Emesis/NG output 450 (0.2)    Total Output 2420 220   Net +1398.7 -115        Emesis Occurrence 2 x      PHYSICAL EXAMINATION: General:  Chronically ill appearing female, acutely ill Neuro: Agitated on fentanyl infusion. Not awake. Moving all fours. RASS sedation score +2 HEENT:  Mm dry, no JVD Cardiovascular:  s1s2 tachy Lungs:  resps even, labored, tachypneic, diminished, few scattered crackles Abdomen:  Soft, not tenderr in the left upper quadrant  Musculoskeletal:  Cool, , no edema   LABS: See  individual assessment and plan  Imaging x48 hours  CXR: Dg Chest [facility]  1 View  12/07/2012   *RADIOLOGY REPORT*  Clinical Data: Check endotracheal tube.  PORTABLE CHEST - 1 VIEW  Comparison: 12/06/2012  Findings: Appliances are unchanged in position.  Shallow inspiration.  Cardiac enlargement without pulmonary vascular congestion.  Consolidation in the left base with probable small left pleural effusion.  No significant change.  No pneumothorax.  IMPRESSION: Appliances appear stable in position.  Cardiac enlargement. Consolidation and  probable small effusion in the left base.   Original Report Authenticated By: Burman Nieves, M.D.   Dg Chest Port 1 View  12/06/2012   *RADIOLOGY REPORT*  Clinical Data: Central line placement.  PORTABLE CHEST - 1 VIEW  Comparison: Earlier today.  Findings: Interval right jugular catheter with its tip in the inferior aspect of the superior vena cava near the cavoatrial junction.  No pneumothorax.  Endotracheal tube in satisfactory position.  Nasogastric tube extending into the stomach.  Mildly improved aeration at the right lung base and in the left perihilar region.  Stable dense left basilar airspace consolidation.  IMPRESSION:  1.  Right jugular catheter tip in the inferior aspect of the superior vena cava without pneumothorax. 2.  Decreased bilateral atelectasis. 3.  Stable dense left basilar atelectasis or pneumonia.   Original Report Authenticated By: Beckie Salts, M.D.   Dg Chest Port 1 View  12/06/2012   *RADIOLOGY REPORT*  Clinical Data: Central line placement.  Acute respiratory failure on ventilator.  PORTABLE CHEST - 1 VIEW  Comparison: 12/05/2012  Findings: Had a left internal jugular center venous catheter is seen with the tip now pulled back into the left internal jugular vein overlying the medial clavicle.  Endotracheal tube and nasogastric tube remain in appropriate position.  Left greater than right lower lung infiltrates are again seen with a small to moderate left pleural effusion.  These findings are stable.  Heart size is unchanged.  IMPRESSION:  1.  Left jugular center venous catheter tip has been pulled back, now overlying the inferior left internal jugular vein. 2.  No significant change in the right greater than left lower lung infiltrates and left pleural effusion.   Original Report Authenticated By: Myles Rosenthal, M.D.     ASSESSMENT / PLAN:  PULMONARY  Recent Labs Lab 12/04/12 2010 12/04/12 2150 12/04/12 2348 12/05/12 0213 12/05/12 1144 12/05/12 2337  PHART 7.273*  7.194* 7.291*  --  7.375 7.403  PCO2ART 31.7* 44.5 33.5*  --  30.4* 29.3*  PO2ART 67.0* 74.0* 88.0  --  169.0* 169.0*  HCO3 14.6* 17.2* 16.1*  --  17.8* 18.2*  TCO2 16 18 17   --  19 19  O2SAT 91.0 91.0 96.0 97.8 100.0 100.0    Acute resp failure - in setting sepsis, abd distension   12/07/2012: doing pressure support trial but not a candidate for extubation due to agitated delirium  P:   Mechanical ventilator support Spontaneous breathing trial as tolerated  CARDIOVASCULAR    Recent Labs Lab 12/03/12 1649 12/03/12 2242 12/05/12 2137 12/06/12 0400 12/06/12 0918  TROPONINI <0.30 <0.30 0.37* 0.35* <0.30      Recent Labs Lab 12/05/12 0918  PROBNP 7718.0*    Shock/hypotension  Severe sepsis  Echo s/o HOCM physiology on 12/04/2012  12/07/2012: Off pressors x48h. New onset atrial fibrillation x 24h possibly due to diastolic dysfunction. Mild troponin leak. High BNP suggesting diastolic dysfunction. Off steroids x 24h   P:  Goal CVP greater than 10 Mean arterial pressure goal greater than 65 Start Lasix and Dependent on course might need  cardiology consultation and rate control Lovenox at dvt prophyl;axis Get echo  RENAL  Recent Labs Lab 12/04/12 0030 12/04/12 1741 12/05/12 0350 12/06/12 0400 12/07/12 0500  NA 134* 141 142 144 146*  K 5.1 4.9 4.5 3.5 3.2*  CL 106 111 111 114* 116*  CO2 22 17* 18* 18* 19  GLUCOSE 99 59* 126* 125* 91  BUN 51* 60* 59* 64* 72*  CREATININE 2.69* 2.65* 2.29* 1.98* 1.86*  CALCIUM 8.3* 8.4 8.0* 8.1* 8.4  MG  --   --  3.2*  --  2.8*  PHOS  --   --  7.4*  --  6.7*   Intake/Output     07/12 0701 - 07/13 0700 07/13 0701 - 07/14 0700   I.V. (mL/kg) 1673.7 (17.6) 105 (1.1)   NG/GT 1445    IV Piggyback 700    Total Intake(mL/kg) 3818.7 (40.2) 105 (1.1)   Urine (mL/kg/hr) 1970 (0.9) 220 (0.5)   Emesis/NG output 450 (0.2)    Total Output 2420 220   Net +1398.7 -115        Emesis Occurrence 2 x      Acute renal failure - in  setting sepsis, hypovolemia +/- meloxicam, hctz . Normal renal ultrasound Metabolic acidosis   12/07/2012: Improving renal failure  P:   Avoid nephrotoxic agents and maintain hemodynamics  GASTROINTESTINAL  Recent Labs Lab 12/03/12 1119 12/05/12 0921  AST 116*  --   ALT 119*  --   ALKPHOS 60  --   BILITOT 0.5  --   PROT 4.4*  --   ALBUMIN 2.4*  --   INR  --  1.58*    Recent Labs Lab 12/05/12 0921  INR 1.58*  A: LUQ pain and tenderness - ? Impacted stool v LLL PNA. CT abd on date of tx to icu showed impacted stool. Normal lipase 12/05/12  12/07/2012 : abd pain. Left upper quadrant tenderness resolved but still consipated. CK improved to 233. Vomited today and tube feeds on hold  P:   Check KUB, if no ileus start reglan  Hold  tube feeds Recheck CK Continue  senna docusate but increase lactulose  HEMATOLOGIC  Recent Labs Lab 12/05/12 0350 12/06/12 0400 12/07/12 0500  HGB 8.2* 7.7* 8.1*  HCT 23.8* 22.8* 23.5*  WBC 9.2 8.9 8.8  PLT 153 111* 103*    Recent Labs Lab 12/05/12 0921  INR 1.58*    Anemia postoperative and anemia critical illness. Status post 1 unit packed red blood cells on 12/03/2012 P:  F/u cbc  DVT prophylaxis with Lovenox especially in post hip surgery situation Packed red blood cell for hemoglobin less than 7 g percent unless actively bleeding   INFECTIOUS  Recent Labs Lab 12/04/12 0030 12/04/12 1741 12/05/12 0350 12/06/12 0400 12/07/12 0500  WBC 7.2 7.1 9.2 8.9 8.8    Recent Labs Lab 12/04/12 1741 12/05/12 0353 12/06/12 0400 12/06/12 0918 12/07/12 0500  LATICACIDVEN 2.4* 2.3* 1.3  --   --   PROCALCITON  --  36.32  --  22.42 16.52    Septic shock likely due to left lower lobe pneumonia. Bowel etiology lower in the differential. Left upper quadrant pain probably referred pain of the left lower lobe pneumonia  12/07/2012: Afebrile and normal white count and off pressors    P:   Pan culture  Vanc, imipenem as above   Monitor culture data and sepsis biomarkers; recheck pro-CAL  ENDOCRINE CBG (last 3)   Recent Labs  12/06/12 1633 12/07/12 0016 12/07/12  0834  GLUCAP 136* 98 82     DM P:   ICU hyperglycemia protocol  NEUROLOGIC AMS - multifactorial r/t sepsis and acute renal failure   12/07/12: Agitated delirium. Failed precedex due to low bp. Needing fent inufsion and prn versed  P:   Supportive care  STart haldol Sedation protocol with fentanyl infusion and Versed as needed  Consider Precedex if agitation continues; might need neosynephrine support  GLOBAL -  12/04/12: detailed discussion with son & daughter , discussed plan & prognosis  12/05/2012: Discussed with 2 sons and 3 daughters at bedside. Diagnose of pneumonia and septic shock renal failure explained. Acute critical illness and competitions of chronic critical illness explained. Cautious optimism expressed. They are extremely frustrated with physician communication prior to ICU transfer. Critical care service needs to update them on a daily basis. I will have office of patient experience contact them  12/06/2012: Children x 3 updated the bedside with nurse at meeting   71/3/14" Family update at bedside    The patient is critically ill with multiple organ systems failure and requires high complexity decision making for assessment and support, frequent evaluation and titration of therapies, application of advanced monitoring technologies and extensive interpretation of multiple databases.   Critical Care Time devoted to patient care services described in this note is  60  Minutes  Dr. Kalman Shan, M.D., [facility].C.P Pulmonary and Critical Care Medicine Staff Physician [facility] Pager: [phone], If no answer or between  15:00h - 7:00h: call [phone]  12/07/2012 11:16 AM

## 2012-12-07 NOTE — Progress Notes (Signed)
[facility] ADULT ICU REPLACEMENT PROTOCOL FOR AM LAB REPLACEMENT ONLY  The patient does not apply for the [facility] Adult ICU Electrolyte Replacment Protocol based on the criteria listed below:   3. Is BUN < 60 mg/dL? no  Patient's BUN today is 72 6. If a panic level lab has been reported, has the CCM MD in charge been notified? yes.   Physician:  Dr. Vicente Males, Aprel Egelhoff P 12/07/2012 6:08 AM

## 2012-12-07 NOTE — Progress Notes (Signed)
eLink Physician-Brief Progress Note Patient Name: Natalie Kent DOB: 12/09/1930 MRN: [id number]  Date of Service  12/07/2012   HPI/Events of Note   Hypotensive after increase in sedation Also, 550 cc of coffee ground material via OGT   eICU Interventions   NS 1000 x 1 Neo-Synephrine gtt STAT CBC, CMP, APPT, INR    Intervention Category Major Interventions: Hypotension - evaluation and management;Hemorrhage - evaluation and management  Kasarah Sitts 12/07/2012, 3:51 PM

## 2012-12-07 NOTE — Progress Notes (Signed)
Pt vomited large amount of brick colored liquid around OGT. Pt. Connected to suction 550 immediate return. B/p decreased . MD notified fluid bolus given orders for lab drawn. Pt stabilized.

## 2012-12-07 NOTE — Progress Notes (Signed)
Pressure stable. Afeb and WBC down.  Seems agitated.  Hopefully weaning from vent soon.  Hip wound benign.  May be OOB to chair WBAT if this becomes an issue.  Appreciate critical care work.  Discussed status and care with one of her sons just now.  Will follow.

## 2012-12-07 NOTE — Progress Notes (Signed)
RT attempted to insert radial aline x2. Very faint radial pulses felt bilaterally. RN notified. RT will continue to monitor.

## 2012-12-07 NOTE — Progress Notes (Signed)
eLink Physician-Brief Progress Note Patient Name: Natalie Kent DOB: 1931-04-11 MRN: [id number]  Date of Service  12/07/2012   HPI/Events of Note     eICU Interventions  Hypokalemia, repleted       MCQUAID, DOUGLAS 12/07/2012, 6:25 AM

## 2012-12-08 LAB — CBC
HCT: 19.3 % — ABNORMAL LOW (ref 36.0–46.0)
HCT: 22.9 % — ABNORMAL LOW (ref 36.0–46.0)
Hemoglobin: 6.5 g/dL — CL (ref 12.0–15.0)
MCH: 28.8 pg (ref 26.0–34.0)
MCHC: 33.7 g/dL (ref 30.0–36.0)
MCV: 84.5 fL (ref 78.0–100.0)
Platelets: 142 10*3/uL — ABNORMAL LOW (ref 150–400)
RBC: 2.27 MIL/uL — ABNORMAL LOW (ref 3.87–5.11)
RDW: 16.2 % — ABNORMAL HIGH (ref 11.5–15.5)
WBC: 14.9 10*3/uL — ABNORMAL HIGH (ref 4.0–10.5)

## 2012-12-08 LAB — GLUCOSE, CAPILLARY: Glucose-Capillary: 76 mg/dL (ref 70–99)

## 2012-12-08 LAB — PROCALCITONIN: Procalcitonin: 8.36 ng/mL

## 2012-12-08 LAB — BASIC METABOLIC PANEL
BUN: 75 mg/dL — ABNORMAL HIGH (ref 6–23)
BUN: 69 mg/dL — ABNORMAL HIGH (ref 6–23)
CO2: 18 mEq/L — ABNORMAL LOW (ref 19–32)
Creatinine, Ser: 1.58 mg/dL — ABNORMAL HIGH (ref 0.50–1.10)
GFR calc non Af Amer: 26 mL/min — ABNORMAL LOW (ref >90)
GFR calc non Af Amer: 30 mL/min — ABNORMAL LOW (ref >90)
Glucose, Bld: 135 mg/dL — ABNORMAL HIGH (ref 70–99)
Glucose, Bld: 123 mg/dL — ABNORMAL HIGH (ref 70–99)
Potassium: 3.3 mEq/L — ABNORMAL LOW (ref 3.5–5.1)
Potassium: 3.7 mEq/L (ref 3.5–5.1)
Sodium: 146 mEq/L — ABNORMAL HIGH (ref 135–145)

## 2012-12-08 LAB — PREPARE RBC (CROSSMATCH)

## 2012-12-08 LAB — PRO B NATRIURETIC PEPTIDE: Pro B Natriuretic peptide (BNP): 3055 pg/mL — ABNORMAL HIGH (ref 0–450)

## 2012-12-08 LAB — PHOSPHORUS: Phosphorus: 6.2 mg/dL — ABNORMAL HIGH (ref 2.3–4.6)

## 2012-12-08 LAB — CK: Total CK: 95 U/L (ref 7–177)

## 2012-12-08 MED ORDER — VANCOMYCIN HCL IN DEXTROSE 1-5 GM/200ML-% IV SOLN
1000.0000 mg | INTRAVENOUS | Status: DC
  Administered 2012-12-09: 1000 mg via INTRAVENOUS
  Filled 2012-12-08: qty 200

## 2012-12-08 MED ORDER — HALOPERIDOL LACTATE 5 MG/ML IJ SOLN: 5.0000 mg | Freq: Four times a day (QID) | INTRAMUSCULAR | Status: DC | PRN

## 2012-12-08 MED ORDER — SENNOSIDES 8.8 MG/5ML PO SYRP
5.0000 mL | ORAL_SOLUTION | Freq: Two times a day (BID) | ORAL | Status: DC
  Administered 2012-12-08: 5 mL
  Filled 2012-12-08 (×3): qty 5

## 2012-12-08 MED ORDER — DOCUSATE SODIUM 50 MG/5ML PO LIQD
100.0000 mg | Freq: Two times a day (BID) | ORAL | Status: DC
  Administered 2012-12-08: 100 mg
  Filled 2012-12-08 (×3): qty 10

## 2012-12-08 MED ORDER — SODIUM CHLORIDE 0.9 % IV BOLUS (SEPSIS)
1000.0000 mL | Freq: Once | INTRAVENOUS | Status: AC
  Administered 2012-12-08: 1000 mL via INTRAVENOUS

## 2012-12-08 MED ORDER — POTASSIUM CHLORIDE 10 MEQ/50ML IV SOLN
INTRAVENOUS | Status: AC
  Filled 2012-12-08: qty 200

## 2012-12-08 MED ORDER — PHENYLEPHRINE HCL 10 MG/ML IJ SOLN
30.0000 ug/min | INTRAVENOUS | Status: DC
  Administered 2012-12-08: 100 ug/min via INTRAVENOUS
  Administered 2012-12-08 (×2): 200 ug/min via INTRAVENOUS
  Filled 2012-12-08 (×7): qty 4

## 2012-12-08 MED ORDER — ALBUTEROL SULFATE HFA 108 (90 BASE) MCG/ACT IN AERS
4.0000 | INHALATION_SPRAY | Freq: Four times a day (QID) | RESPIRATORY_TRACT | Status: DC | PRN
  Filled 2012-12-08: qty 6.7

## 2012-12-08 MED ORDER — POTASSIUM CHLORIDE 10 MEQ/50ML IV SOLN
10.0000 meq | INTRAVENOUS | Status: AC
  Administered 2012-12-08 (×4): 10 meq via INTRAVENOUS

## 2012-12-08 NOTE — Progress Notes (Signed)
Hypotensive last night and now back on pressor.  Breathing on own with vent.  Agitated.  Appreciate CCM management.  Could not find any family members this morning and will try to contact daughter by phone today.

## 2012-12-08 NOTE — Progress Notes (Signed)
PT Cancellation Note  Patient Details Name: Natalie Kent MRN: [id number] DOB: [birth year]   Cancelled Treatment:    Reason Eval/Treat Not Completed: Medical issues which prohibited therapy;  On vent on pressors, will attempt at later date.   [name],CYNDI 12/08/2012, 2:19 PM

## 2012-12-08 NOTE — Progress Notes (Signed)
CRITICAL VALUE ALERT  Critical value received:  Hemoglobin 6.5  Date of notification:  12/08/2012  Time of notification:  0205  Critical value read back:yes  Nurse who received alert:  Gregor Hams  MD notified (1st page):  Dr. Sherene Sires  Time of first page:  0205  MD notified (2nd page):  Time of second page:  Responding MD:  Dr. Sherene Sires  Time MD responded:  [phone]

## 2012-12-08 NOTE — Progress Notes (Signed)
Name: Natalie Kent MRN: [id number] DOB: November 18, 1930  ELECTRONIC ICU PHYSICIAN NOTE  Problem:  Shock, RAF CVP:  [5 mmHg-9 mmHg] 5 mmHg    Intervention:  NS x 1 liter, titrate neo as prev ordered  Sandrea Hughs 12/08/2012, 1:48 AM

## 2012-12-08 NOTE — Progress Notes (Signed)
OT Cancellation Note  Patient Details Name: AOWYN ROZEBOOM MRN: [id number] DOB: [birth year]   Cancelled Treatment:    Reason Eval/Treat Not Completed: Medical issues which prohibited therapy  [facility] Zenith Lamphier, OTR/L  [phone] 12/08/2012 12/08/2012, 1:07 PM

## 2012-12-08 NOTE — Progress Notes (Signed)
Name: LAQUANDA BICK MRN: [id number] DOB: 1930-11-29  ELECTRONIC ICU PHYSICIAN NOTE  Problem:  Shock with hgb drifting down, Pos NG blood loss but only mild  Recent Labs Lab 12/07/12 0500 12/07/12 1620 12/08/12 0140  HGB 8.1* 7.3* 6.5*     Intervention:  One unit ordered for now then recheck hgb before next D/c asa, continue ppi  Sandrea Hughs 12/08/2012, 2:14 AM

## 2012-12-08 NOTE — Progress Notes (Signed)
ANTIBIOTIC and ANTICOAGULATION CONSULT NOTE - FOLLOW UP  Pharmacy Consult for vancomycin, Lovenox Indication: pneumonia, VTE prophylaxis after TKA  Allergies  Allergen Reactions  . Solarcaine Aloe Extra (Lidocaine Hcl)   . Septra (Bactrim)   . Cephalexin Rash    Patient Measurements: Height: 5\' 6"  (167.6 cm) Weight: 209 lb 3.5 oz (94.9 kg) IBW/kg (Calculated) : 59.3   Vital Signs: Temp: 100.4 F (38 C) (07/14 1200) Temp src: Core (Comment) (07/14 1200) BP: 131/47 mmHg (07/14 1135) Pulse Rate: 114 (07/14 1135) Intake/Output from previous day: 07/13 0701 - 07/14 0700 In: 3576.8 [I.V.:1226.8; Blood:350; IV Piggyback:1600] Out: 3625 [Urine:2125; Emesis/NG output:1500] Intake/Output from this shift: Total I/O In: 647.5 [I.V.:497.5; IV Piggyback:150] Out: 250 [Urine:250]  Labs:  Recent Labs  12/07/12 0500 12/07/12 1620 12/07/12 1730 12/08/12 0140 12/08/12 0743  WBC 8.8 11.4*  --  11.3* 14.9*  HGB 8.1* 7.3*  --  6.5* 7.8*  PLT 103* 117*  --  121* 142*  CREATININE 1.86*  --  1.78* 1.79*  --    Estimated Creatinine Clearance: 29.6 ml/min (by C-G formula based on Cr of 1.79).  Recent Labs  12/08/12 1115  VANCOTROUGH 26.8*     Microbiology: Recent Results (from the past 720 hour(s))  SURGICAL PCR SCREEN     Status: Abnormal   Collection Time    11/25/12  2:37 PM      Result Value Range Status   MRSA, PCR POSITIVE (*) NEGATIVE Final   Staphylococcus aureus POSITIVE (*) NEGATIVE Final   Comment:            The Xpert SA Assay (FDA     approved for NASAL specimens     in patients over 53 years of age),     is one component of     a comprehensive surveillance     program.  Test performance has     been validated by [employer] for patients greater     than or equal to 20 year old.     It is not intended     to diagnose infection nor to     guide or monitor treatment.  URINE CULTURE     Status: None   Collection Time    11/25/12  2:37 PM      Result  Value Range Status   Specimen Description URINE, CLEAN CATCH   Final   Special Requests NONE   Final   Culture  Setup Time 11/25/2012 15:39   Final   Colony Count 80,000 COLONIES/ML   Final   Culture     Final   Value: GROUP B STREP(S.AGALACTIAE)ISOLATED     Note: TESTING AGAINST S. AGALACTIAE NOT ROUTINELY PERFORMED DUE TO PREDICTABILITY OF AMP/PEN/VAN SUSCEPTIBILITY.   Report Status 11/26/2012 FINAL   Final  URINE CULTURE     Status: None   Collection Time    12/03/12 11:16 AM      Result Value Range Status   Specimen Description URINE, RANDOM   Final   Special Requests NONE   Final   Culture  Setup Time 12/03/2012 17:13   Final   Colony Count NO GROWTH   Final   Culture NO GROWTH   Final   Report Status 12/04/2012 FINAL   Final  CULTURE, BLOOD (ROUTINE X 2)     Status: None   Collection Time    12/03/12  4:40 PM      Result Value Range Status   Specimen  Description BLOOD LEFT ARM   Final   Special Requests BOTTLES DRAWN AEROBIC ONLY 5CC   Final   Culture  Setup Time 12/04/2012 00:10   Final   Culture     Final   Value:        BLOOD CULTURE RECEIVED NO GROWTH TO DATE CULTURE WILL BE HELD FOR 5 DAYS BEFORE ISSUING A FINAL NEGATIVE REPORT   Report Status PENDING   Incomplete  CULTURE, BLOOD (ROUTINE X 2)     Status: None   Collection Time    12/03/12  4:45 PM      Result Value Range Status   Specimen Description BLOOD LEFT HAND   Final   Special Requests BOTTLES DRAWN AEROBIC ONLY 5CC   Final   Culture  Setup Time 12/04/2012 00:11   Final   Culture     Final   Value:        BLOOD CULTURE RECEIVED NO GROWTH TO DATE CULTURE WILL BE HELD FOR 5 DAYS BEFORE ISSUING A FINAL NEGATIVE REPORT   Report Status PENDING   Incomplete  CULTURE, BAL-QUANTITATIVE     Status: None   Collection Time    12/05/12  3:06 PM      Result Value Range Status   Specimen Description BRONCHIAL ALVEOLAR LAVAGE   Final   Special Requests NONE   Final   Gram Stain     Final   Value: NO WBC SEEN     NO  SQUAMOUS EPITHELIAL CELLS SEEN     NO ORGANISMS SEEN   Colony Count 25,000 COLONIES/ML   Final   Culture YEAST   Final   Report Status 12/07/2012 FINAL   Final    Anti-infectives   Start     Dose/Rate Route Frequency Ordered Stop   12/05/12 1400  vancomycin (VANCOCIN) IVPB 1000 mg/200 mL premix  Status:  Discontinued     1,000 mg 200 mL/hr over 60 Minutes Intravenous Every 48 hours 12/04/12 1408 12/05/12 1043   12/05/12 1200  imipenem-cilastatin (PRIMAXIN) 250 mg in sodium chloride 0.9 % 100 mL IVPB     250 mg 200 mL/hr over 30 Minutes Intravenous Every 6 hours 12/05/12 1043     12/05/12 1200  vancomycin (VANCOCIN) IVPB 1000 mg/200 mL premix     1,000 mg 200 mL/hr over 60 Minutes Intravenous Every 24 hours 12/05/12 1043     12/05/12 0200  imipenem-cilastatin (PRIMAXIN) 250 mg in sodium chloride 0.9 % 100 mL IVPB  Status:  Discontinued     250 mg 200 mL/hr over 30 Minutes Intravenous Every 12 hours 12/04/12 1408 12/05/12 1043   12/03/12 2200  vancomycin (VANCOCIN) IVPB 750 mg/150 ml premix  Status:  Discontinued     750 mg 150 mL/hr over 60 Minutes Intravenous Every 24 hours 12/03/12 1642 12/04/12 1408   12/03/12 1800  imipenem-cilastatin (PRIMAXIN) 250 mg in sodium chloride 0.9 % 100 mL IVPB  Status:  Discontinued     250 mg 200 mL/hr over 30 Minutes Intravenous 4 times per day 12/03/12 1642 12/04/12 1408   12/10/2012 2000  vancomycin (VANCOCIN) IVPB 1000 mg/200 mL premix     1,000 mg 200 mL/hr over 60 Minutes Intravenous Every 12 hours 12/04/2012 1139 12/08/2012 2322   12/06/2012 0600  vancomycin (VANCOCIN) IVPB 1000 mg/200 mL premix     1,000 mg 200 mL/hr over 60 Minutes Intravenous On call to O.R. 12/01/12 1441 11/26/2012 0742      Assessment: 79 YOF s/p total hip who  has septic shock likely from LLL PNA. Her renal function has been improving over the last several days, however her vancomycin trough drawn today was elevated at 26.8. She is not clinically improving, and is requiring  pressors. She is also on imipenem which is dose appropriately with her current renal function.  Patient has been on prophylactic Lovenox 30mg  subq Q24h since 7/11. She has required 2 transfusions of 1 unit PRBC. Her Hgb is currently 7.8 with platelets of 142. She has had coffee ground emesis and is receiving IV PPI.  Goal of Therapy:  Vancomycin trough level 15-20 mcg/ml  Plan:  1. Reduce vancomycin to 1000mg  IV Q48h 2. Continue imipenem 250mg  IV q6h 3. Continue Lovenox 30mg  subq Q24 as patient is high risk of VTE after hip surgery 4. Follow renal function, clinical status, cultures/sensitvities, and CBC closely  Janeal Abadi D. Shin Lamour, PharmD Clinical Pharmacist Pager: [phone] 12/08/2012 2:08 PM

## 2012-12-08 NOTE — Progress Notes (Signed)
[facility] ADULT ICU REPLACEMENT PROTOCOL FOR AM LAB REPLACEMENT ONLY  The patient does not apply for the [facility] Adult ICU Electrolyte Replacment Protocol based on the criteria listed below:   1. Is GFR >/= 40 ml/min? no  Patient's GFR today is 26 4. Abnormal electrolyte(s): K+3.3 5. Ordered repletion with: NA 6. If a panic level lab has been reported, has the CCM MD in charge been notified? yes.   Physician:  Rise Paganini 12/08/2012 6:17 AM

## 2012-12-08 NOTE — Progress Notes (Signed)
PULMONARY  / CRITICAL CARE MEDICINE  Name: Natalie Kent MRN: [id number] DOB: 04-Mar-1931    ADMISSION DATE:  12/06/12 CONSULTATION DATE:  7/10  REFERRING MD :  Ortho  PRIMARY SERVICE: PCCM  CHIEF COMPLAINT:  sepsis  Brief patient description: 79yo female with hx HTN, asthma, remote DVT admitted 7/8 for R total hip.  Post op with increasing somnolence, hypotension, abd pain.  7/10 developed worsening hypotension, acute renal failure and encephalopathy though r/t sepsis of unknown etiology and PCCM consulted.  At time of consult pt is altered, very little UOP, SBP 70-80, tender abd, tachypneic. This am lactate 4.5.  No reports of vomiting, diarrhea.    LINES / TUBES: ETT 7/11 >>> L IJ CVL 7/10>>>  CULTURES:   MRSA PCR 11/25/12 - Positive MSSA PCR 11/25/12 - Positive Urine 11/25/12 - strep agalactiae ........... Urine 7/10>>>neg BCx2 7/10>>> neg   ANTIBIOTICS: Vancomycin 7/10>>> Primaxin 7/10>>>  SIGNIFICANT EVENTS / STUDIES:  7/8 R total hip  2D echo 7/10>>>hyperdynamic LV, nml EF, gr 1 diastolic dysfunction, findings s/o HOCM physiology 7/10 CT abd>>>Recent postoperative changes in the right hip and surrounding softtissues. Small bilateral pleural effusions with atelectasis or consolidation in the lung bases. Stool filled colon suggesting constipation. Anterior abdominal wall hernia containing fat with fat stranding suggesting possible fat necrosis. 7/13 Transfuse PRBC  SUBJECTIVE: Tolerating pressure support.  Needed PRBC transfusion.  Remains on pressors.  VITAL SIGNS: Temp:  [99.9 F (37.7 C)-100.9 F (38.3 C)] 100.2 F (37.9 C) (07/14 0800) Pulse Rate:  [73-136] 98 (07/14 0811) Resp:  [15-33] 21 (07/14 0811) BP: (52-152)/(18-100) 130/81 mmHg (07/14 0811) SpO2:  [91 %-100 %] 99 % (07/14 0811) FiO2 (%):  [30 %] 30 % (07/14 0811) HEMODYNAMICS: CVP:  [5 mmHg] 5 mmHg VENTILATOR SETTINGS: Vent Mode:  [-] PSV FiO2 (%):  [30 %] 30 % Set Rate:  [20 bmp] 20 bmp Vt Set:   [480 mL] 480 mL PEEP:  [5 cmH20] 5 cmH20 Pressure Support:  [8 cmH20] 8 cmH20 Plateau Pressure:  [19 cmH20-21 cmH20] 20 cmH20 INTAKE / OUTPUT: Intake/Output     07/13 0701 - 07/14 0700 07/14 0701 - 07/15 0700   I.V. (mL/kg) 1226.8 (12.9) 392.5 (4.1)   Blood 350    Other 400    NG/GT     IV Piggyback 1600    Total Intake(mL/kg) 3576.8 (37.7) 392.5 (4.1)   Urine (mL/kg/hr) 2125 (0.9) 250 (0.6)   Emesis/NG output 1500 (0.7)    Total Output 3625 250   Net -48.2 +142.5          PHYSICAL EXAMINATION: General:  Chronically ill appearing female, acutely ill Neuro: Agitated on fentanyl infusion. Not awake. Moving all fours. RASS sedation score -1 HEENT:  Mm dry, no JVD Cardiovascular:  s1s2 tachy Lungs: CTA anterior  Abdomen:  Soft, not tenderr in the left upper quadrant  Musculoskeletal:  Cool, , no edema   LABS: CBC Recent Labs     12/07/12  1620  12/08/12  0140  12/08/12  0743  WBC  11.4*  11.3*  14.9*  HGB  7.3*  6.5*  7.8*  HCT  21.3*  19.3*  22.9*  PLT  117*  121*  142*    BMET Recent Labs     12/07/12  0500  12/07/12  1730  12/08/12  0140  NA  146*  148*  146*  K  3.2*  3.6  3.3*  CL  116*  117*  115*  CO2  19  20  18*  BUN  72*  71*  75*  CREATININE  1.86*  1.78*  1.79*  GLUCOSE  91  98  135*    Electrolytes Recent Labs     12/07/12  0500  12/07/12  1730  12/08/12  0140  CALCIUM  8.4  8.1*  8.2*  MG  2.8*   --   2.5  PHOS  6.7*   --   6.2*    Sepsis Markers Recent Labs     12/06/12  0918  12/07/12  0500  12/08/12  0140  PROCALCITON  22.42  16.52  8.36    ABG Recent Labs     12/05/12  1144  12/05/12  2337  PHART  7.375  7.403  PCO2ART  30.4*  29.3*  PO2ART  169.0*  169.0*    Liver Enzymes Recent Labs     12/07/12  1730  AST  93*  ALT  90*  ALKPHOS  112  BILITOT  0.7  ALBUMIN  1.5*    Cardiac Enzymes Recent Labs     12/05/12  2137  12/06/12  0400  12/06/12  0918  12/08/12  0140  TROPONINI  0.37*  0.35*  <0.30   --    PROBNP   --    --    --   3055.0*    Glucose Recent Labs     12/07/12  1227  12/07/12  1802  12/07/12  2101  12/08/12  0001  12/08/12  0436  12/08/12  0839  GLUCAP  82  89  75  76  99  80    Imaging Dg Chest Port 1 View  12/07/2012   *RADIOLOGY REPORT*  Clinical Data: Check endotracheal tube.  PORTABLE CHEST - 1 VIEW  Comparison: 12/06/2012  Findings: Appliances are unchanged in position.  Shallow inspiration.  Cardiac enlargement without pulmonary vascular congestion.  Consolidation in the left base with probable small left pleural effusion.  No significant change.  No pneumothorax.  IMPRESSION: Appliances appear stable in position.  Cardiac enlargement. Consolidation and probable small effusion in the left base.   Original Report Authenticated By: Burman Nieves, M.D.   Dg Chest Port 1 View  12/06/2012   *RADIOLOGY REPORT*  Clinical Data: Central line placement.  PORTABLE CHEST - 1 VIEW  Comparison: Earlier today.  Findings: Interval right jugular catheter with its tip in the inferior aspect of the superior vena cava near the cavoatrial junction.  No pneumothorax.  Endotracheal tube in satisfactory position.  Nasogastric tube extending into the stomach.  Mildly improved aeration at the right lung base and in the left perihilar region.  Stable dense left basilar airspace consolidation.  IMPRESSION:  1.  Right jugular catheter tip in the inferior aspect of the superior vena cava without pneumothorax. 2.  Decreased bilateral atelectasis. 3.  Stable dense left basilar atelectasis or pneumonia.   Original Report Authenticated By: Beckie Salts, M.D.   Dg Chest Port 1 View  12/06/2012   *RADIOLOGY REPORT*  Clinical Data: Central line placement.  Acute respiratory failure on ventilator.  PORTABLE CHEST - 1 VIEW  Comparison: 12/05/2012  Findings: Had a left internal jugular center venous catheter is seen with the tip now pulled back into the left internal jugular vein overlying the medial clavicle.   Endotracheal tube and nasogastric tube remain in appropriate position.  Left greater than right lower lung infiltrates are again seen with a small to moderate left pleural effusion.  These findings are stable.  Heart size is unchanged.  IMPRESSION:  1.  Left jugular center venous catheter tip has been pulled back, now overlying the inferior left internal jugular vein. 2.  No significant change in the right greater than left lower lung infiltrates and left pleural effusion.   Original Report Authenticated By: Myles Rosenthal, M.D.   Dg Abd Portable 1v  12/07/2012   *RADIOLOGY REPORT*  Clinical Data: Abdominal pain  PORTABLE ABDOMEN - 1 VIEW  Comparison: 12/04/2012  Findings: NG tube in the stomach.  Negative for bowel obstruction. Moderate constipation with stool throughout the colon.  This is similar to the prior study.  Clips in the gallbladder fossa.  Right hip replacement in satisfactory position.  Levoscoliosis.  IMPRESSION: Constipation without bowel obstruction.   Original Report Authenticated By: Janeece Riggers, M.D.     ASSESSMENT / PLAN:  PULMONARY A: Acute resp failure - in setting sepsis, abd distension  P:   - Full vent support - Daily PS trial - WU assessment - f/u CXR  CARDIOVASCULAR A: Shock/hypotension  Severe sepsis  Echo s/o HOCM physiology on 12/04/2012 P:  - Continue Neosynephrine - Trend CVP with goal 10-12 - Trend BNP - May need cardiology consult  RENAL A: Acute renal failure - in setting sepsis, hypovolemia +/- meloxicam, hctz . Normal renal ultrasound Metabolic acidosis  P:   - Replace gastric losses - Replete electrolytes - Strict I&O  GASTROINTESTINAL A: LUQ pain and tenderness - ? Impacted stool v LLL PNA. CT abd on date of tx to icu showed impacted stool. Normal lipase 12/05/12 P:   - Protonix IV q12 given coffee ground gastric material - NPO - May need enemas to clear stool burden from the colon - May need consideration for TPN if prolonged NPO    HEMATOLOGIC A: Anemia postoperative and anemia critical illness.  P:  - CBC daily - DVT prophylaxis with Lovenox especially in post hip surgery situation - Packed red blood cell for hemoglobin less than 7 g percent unless actively bleeding  INFECTIOUS A: Septic shock likely due to left lower lobe pneumonia. Bowel etiology lower in the differential. Left upper quadrant pain probably referred pain of the left lower lobe pneumonia P:   - Continue Vancomycin - Continue Imipenem - F/U cultures - Procalcitonin   ENDOCRINE A: DM P:   - Follow CBG - SSI  NEUROLOGIC A: AMS - multifactorial r/t sepsis and acute renal failure  Delerium P:   - Goal RASS 0 to -1 - limit sedation  Today's Summary: Mrs. Kamiya remains acutely ill with sepsis on neosynephrine. Had a drop in Hgb with coffee ground emesis in the NGT, received 1 unit PRBC. Will trend the CBC and transfuse as needed. Still has a large amount of stool burden in the colon despite senokot and lactulose, will try enemas to clear the colon.  Benjamin Hocutt S-ACNP  CC time 40 minutes.  Reviewed above, examined pt, and changes to documentation made as needed.  Coralyn Helling, MD [facility] Pulmonary/Critical Care 12/08/2012, 11:21 AM Pager:  [phone] After 3pm call: [phone]

## 2012-12-09 ENCOUNTER — Inpatient Hospital Stay (HOSPITAL_COMMUNITY): Payer: Medicare Other

## 2012-12-09 DIAGNOSIS — A419 Sepsis, unspecified organism: Secondary | ICD-10-CM

## 2012-12-09 DIAGNOSIS — R6521 Severe sepsis with septic shock: Secondary | ICD-10-CM

## 2012-12-09 LAB — BASIC METABOLIC PANEL
BUN: 66 mg/dL — ABNORMAL HIGH (ref 6–23)
CO2: 22 mEq/L (ref 19–32)
Calcium: 8.6 mg/dL (ref 8.4–10.5)
Chloride: 115 mEq/L — ABNORMAL HIGH (ref 96–112)
Creatinine, Ser: 1.49 mg/dL — ABNORMAL HIGH (ref 0.50–1.10)
GFR calc Af Amer: 37 mL/min — ABNORMAL LOW (ref >90)

## 2012-12-09 LAB — CBC
Hemoglobin: 7.8 g/dL — ABNORMAL LOW (ref 12.0–15.0)
MCH: 28.8 pg (ref 26.0–34.0)
MCHC: 33.5 g/dL (ref 30.0–36.0)
MCV: 86 fL (ref 78.0–100.0)
RBC: 2.71 MIL/uL — ABNORMAL LOW (ref 3.87–5.11)

## 2012-12-09 LAB — MAGNESIUM: Magnesium: 2.3 mg/dL (ref 1.5–2.5)

## 2012-12-09 LAB — GLUCOSE, CAPILLARY
Glucose-Capillary: 98 mg/dL (ref 70–99)
Glucose-Capillary: 96 mg/dL (ref 70–99)
Glucose-Capillary: 96 mg/dL (ref 70–99)
Glucose-Capillary: 96 mg/dL (ref 70–99)

## 2012-12-09 LAB — IRON AND TIBC
Saturation Ratios: 19 % — ABNORMAL LOW (ref 20–55)
UIBC: 126 ug/dL (ref 125–400)

## 2012-12-09 LAB — LIPASE, BLOOD: Lipase: 84 U/L — ABNORMAL HIGH (ref 11–59)

## 2012-12-09 LAB — AMYLASE: Amylase: 48 U/L (ref 0–105)

## 2012-12-09 LAB — HEPATIC FUNCTION PANEL
Bilirubin, Direct: 0.4 mg/dL — ABNORMAL HIGH (ref 0.0–0.3)
Indirect Bilirubin: 0.4 mg/dL (ref 0.3–0.9)
Total Protein: 4.4 g/dL — ABNORMAL LOW (ref 6.0–8.3)

## 2012-12-09 LAB — LACTIC ACID, PLASMA: Lactic Acid, Venous: 1 mmol/L (ref 0.5–2.2)

## 2012-12-09 MED ORDER — FENTANYL CITRATE 0.05 MG/ML IJ SOLN
25.0000 ug | INTRAMUSCULAR | Status: DC | PRN
  Administered 2012-12-09: 50 ug via INTRAVENOUS
  Administered 2012-12-10 – 2012-12-11 (×3): 100 ug via INTRAVENOUS
  Filled 2012-12-09 (×4): qty 2

## 2012-12-09 MED ORDER — POTASSIUM CHLORIDE 10 MEQ/50ML IV SOLN
10.0000 meq | INTRAVENOUS | Status: AC
  Administered 2012-12-09 (×4): 10 meq via INTRAVENOUS
  Filled 2012-12-09 (×2): qty 100

## 2012-12-09 MED ORDER — ALBUTEROL SULFATE (5 MG/ML) 0.5% IN NEBU: 2.5000 mg | INHALATION_SOLUTION | RESPIRATORY_TRACT | Status: DC | PRN

## 2012-12-09 MED ORDER — ALBUTEROL SULFATE (5 MG/ML) 0.5% IN NEBU
2.5000 mg | INHALATION_SOLUTION | Freq: Four times a day (QID) | RESPIRATORY_TRACT | Status: DC | PRN
  Administered 2012-12-09: 2.5 mg via RESPIRATORY_TRACT
  Filled 2012-12-09 (×2): qty 0.5

## 2012-12-09 NOTE — Progress Notes (Signed)
OT Cancellation Note  Patient Details Name: Natalie Kent MRN: [id number] DOB: 02/05/1931   Cancelled Treatment:    Reason Eval/Treat Not Completed: Medical issues which prohibited therapy (intubated/sedated)  [facility] Marchetta Navratil, OTR/L  [phone] 12/09/2012 12/09/2012, 9:04 AM

## 2012-12-09 NOTE — Progress Notes (Addendum)
eLink Physician-Brief Progress Note Patient Name: Natalie Kent DOB: 1930-08-15 MRN: [id number]  Date of Service  12/09/2012   HPI/Events of Note   Routine review from elink  - RN reports ongoing GI issues with biliouis returns. I called radiology and spoke to Dr Angela Burke radiologist. [place] Main CT abd 12/03/12 is unremarkable. Patient has stable small umbilical hernia. He is not convinced about fat necrosis  eICU Interventions  Recheck lactate, lft, lipase, amylase, ckl AXR stat Goal K > 4   Intervention Category Intermediate Interventions: Diagnostic test evaluation  Adorian Gwynne 12/09/2012, 5:28 AM

## 2012-12-09 NOTE — Evaluation (Signed)
SLP Cancellation Note  Patient Details Name: Natalie Kent MRN: [id number] DOB: July 09, 1930   Cancelled treatment:       Reason Eval/Treat Not Completed: Medical issues which prohibited therapy (pt with NG in place, having output per RN, just extubated today after intubation x5 days,  Slp to reattempt next date.  Donavan Burnet, MS [facility] SLP [phone]

## 2012-12-09 NOTE — Progress Notes (Signed)
eLink Physician Progress Note and Electrolyte Replacement  Patient Name: Natalie Kent DOB: 05-07-1931 MRN: [id number]  Date of Service  12/09/2012   HPI/Events of Note    Recent Labs Lab 12/04/12 1741 12/05/12 0350  12/07/12 0500 12/07/12 1730 12/08/12 0140 12/08/12 1600 12/09/12 0400  NA 141 142  < > 146* 148* 146* 149* 148*  K 4.9 4.5  < > 3.2* 3.6 3.3* 3.7 3.1*  CL 111 111  < > 116* 117* 115* 116* 115*  CO2 17* 18*  < > 19 20 18* 21 22  GLUCOSE 59* 126*  < > 91 98 135* 123* 112*  BUN 60* 59*  < > 72* 71* 75* 69* 66*  CREATININE 2.65* 2.29*  < > 1.86* 1.78* 1.79* 1.58* 1.49*  CALCIUM 8.4 8.0*  < > 8.4 8.1* 8.2* 8.5 8.6  MG  --  3.2*  --  2.8*  --  2.5  --  2.3  PHOS  --  7.4*  --  6.7*  --  6.2*  --  5.2*  < > = values in this interval not displayed.  Estimated Creatinine Clearance: 35.4 ml/min (by C-G formula based on Cr of 1.49).  Intake/Output     07/14 0701 - 07/15 0700   I.V. (mL/kg) 1638.2 (17.4)   NG/GT 50   IV Piggyback 400   Total Intake(mL/kg) 2088.2 (22.2)   Urine (mL/kg/hr) 1800 (0.8)   Total Output 1800   Net +288.2        - I/O DETAILED x 24h    Total I/O In: 633.1 [I.V.:583.1; NG/GT:50] Out: 450 [Urine:450] - I/O THIS SHIFT    ASSESSMENT Improving renal failure Low K  eICURN Interventions  Replete K Need K goa, > 4   ASSESSMENT: MAJOR ELECTROLYTE      Dr. Kalman Shan, M.D., [facility].C.P Pulmonary and Critical Care Medicine Staff Physician [facility] Pulmonary and Critical Care Pager: [phone], If no answer or between  15:00h - 7:00h: call [phone]  12/09/2012 5:34 AM

## 2012-12-09 NOTE — Significant Event (Signed)
Pt noted to have cough and then vomit of bilious material.  Will have NG tube inserted to suction and monitor.  Respiratory status otherwise stable after extubation.  Coralyn Helling, MD [facility] Pulmonary/Critical Care 12/09/2012, 11:07 AM Pager:  [phone] After 3pm call: [phone]

## 2012-12-09 NOTE — Progress Notes (Addendum)
Tap water enema given with minimal results. Scant amt of brown stool noted

## 2012-12-09 NOTE — Progress Notes (Signed)
Physical Therapy Treatment Patient Details Name: Natalie Kent MRN: [id number] DOB: July 17, 1930 Today's Date: 12/09/2012 Time: 6578-4696 PT Time Calculation (min): 26 min  PT Assessment / Plan / Recommendation  PT Comments   Pt extubated this AM. Pt remains confused but able to tolerate sitting EOB. HR 130-140s while sitting eob doing LE there ex. Pt able to follow commands but con't to require total assist for all mobility. Pt to benefit from SNF upon d/c to address mentioned deficits.   Follow Up Recommendations  SNF     Does the patient have the potential to tolerate intense rehabilitation     Barriers to Discharge        Equipment Recommendations  None recommended by PT    Recommendations for Other Services    Frequency Min 5X/week   Progress towards PT Goals Progress towards PT goals: Progressing toward goals  Plan Current plan remains appropriate;Frequency needs to be updated    Precautions / Restrictions Precautions Precautions: Fall Restrictions RLE Weight Bearing: Weight bearing as tolerated   Pertinent Vitals/Pain Pt denies pain    Mobility  Bed Mobility Bed Mobility: Supine to Sit;Sit to Supine Supine to Sit: 1: +2 Total assist;HOB elevated Supine to Sit: Patient Percentage: 20% Sitting - Scoot to Edge of Bed: 1: +2 Total assist Sitting - Scoot to Edge of Bed: Patient Percentage: 10% Sit to Supine: 1: +2 Total assist;HOB flat Sit to Supine: Patient Percentage: 10% Details for Bed Mobility Assistance: assist for trunk elevation and LE management. pt attempted to assist with use of UEs Transfers Transfers: Not assessed (not cleared by RN)   Sat EOB x 10 min with min to maxA with onset of fatigue   Exercises Total Joint Exercises Ankle Circles/Pumps: AROM;Both;Supine Quad Sets: AROM;Both;10 reps;Supine Long Arc Quad: AROM;Both;20 reps;Seated   PT Diagnosis:    PT Problem List:   PT Treatment Interventions:     PT Goals (current goals can now be found  in the care plan section)    Visit Information  Last PT Received On: 12/09/12 Assistance Needed: +2 History of Present Illness: s/p elective R THA who required intubation due to resp failure. pt extubated this date at 1040am    Subjective Data  Subjective: Pt received supine in bed agreeable to sit EOB   Cognition  Cognition Arousal/Alertness: Awake/alert Behavior During Therapy: Flat affect Overall Cognitive Status: Impaired/Different from baseline    Balance     End of Session PT - End of Session Activity Tolerance: Patient limited by lethargy (inc HR) Patient left: hand mits back on, in bed;with call bell/phone within reach;with family/visitor present Nurse Communication: Mobility status   GP     Marcene Brawn 12/09/2012, 4:31 PM  Lewis Shock, PT, DPT Pager #: [phone] Office #: [phone]

## 2012-12-09 NOTE — Progress Notes (Signed)
NUTRITION FOLLOW UP  Intervention:   1.  Food/Beverage; resume of PO diet per MD discretion based on medical appropriateness. 2.  Enteral nutrition; Question whether resume of trickle feeds after severe constipation resolved would assist with diet progression. Recommend Vital 1.2 @ 10 mL/hr continuous if warranted.   Nutrition Dx:   Inadequate oral intake, ongoing.  Monitor:   1.  Enteral nutrition; initiation with tolerance.  Pt to meet >/=90% estimated needs with nutrition support.   Not met, pt TF's held due to poor tolerance.  2.  Wt/wt change; monitor trends.  Ongoing. 3.  Food/Beverage; pt meeting >/=90% estimated needs with tolerance once diet advanced.  Assessment:   Pt presented for hip surgery.  Experience worsening abdominal pain and septic shock r/t pneumonia. Now with respiratory insufficiency.  Pt recently extubated.   Pt continues with bile output.  RN replacing NGT at time of visit for suctioning.  Per RN, pt initiated TFs over the weekend, however did not tolerance with residuals >400 mL x2.  Per RN, pt is severely constipated.  Plan is to continue with suppositories/enemas.  Height: Ht Readings from Last 1 Encounters:  12/04/12 5\' 6"  (1.676 m)    Weight Status:   Wt Readings from Last 1 Encounters:  12/09/12 207 lb 7.3 oz (94.1 kg)    Re-estimated needs:  Kcal: 1880-2070 Protein: 75-93g Fluid: >1.8 L/day  Skin: intact  Diet Order: NPO   Intake/Output Summary (Last 24 hours) at 12/09/12 1226 Last data filed at 12/09/12 1200  Gross per 24 hour  Intake 2137.55 ml  Output   2750 ml  Net -612.45 ml    Last BM: 7/14 with enema   Labs:   Recent Labs Lab 12/07/12 0500  12/08/12 0140 12/08/12 1600 12/09/12 0400  NA 146*  < > 146* 149* 148*  K 3.2*  < > 3.3* 3.7 3.1*  CL 116*  < > 115* 116* 115*  CO2 19  < > 18* 21 22  BUN 72*  < > 75* 69* 66*  CREATININE 1.86*  < > 1.79* 1.58* 1.49*  CALCIUM 8.4  < > 8.2* 8.5 8.6  MG 2.8*  --  2.5  --  2.3   PHOS 6.7*  --  6.2*  --  5.2*  GLUCOSE 91  < > 135* 123* 112*  < > = values in this interval not displayed.  CBG (last 3)   Recent Labs  12/09/12 0345 12/09/12 0755 12/09/12 1115  GLUCAP 98 96 96    Scheduled Meds: . chlorhexidine  15 mL Mouth/Throat BID  . [START ON 01/01/2013] cyanocobalamin  1,000 mcg Intramuscular Q30 days  . enoxaparin (LOVENOX) injection  30 mg Subcutaneous Q24H  . imipenem-cilastatin  250 mg Intravenous Q6H  . pantoprazole (PROTONIX) IV  40 mg Intravenous Daily  . vancomycin  1,000 mg Intravenous Q48H    Continuous Infusions: . sodium chloride 10 mL/hr at 12/07/12 2000  . sodium chloride 10 mL/hr (12/09/12 0517)  . phenylephrine (NEO-SYNEPHRINE) Adult infusion Stopped (12/09/12 1200)    Loyce Dys, MS RD LDN Clinical Inpatient Dietitian Pager: [phone] Weekend/After hours pager: [phone]

## 2012-12-09 NOTE — Progress Notes (Addendum)
PULMONARY  / CRITICAL CARE MEDICINE  Name: GARY BULTMAN MRN: [id number] DOB: 1931/02/28    ADMISSION DATE:  2012/12/03 CONSULTATION DATE:  12/04/2012  REFERRING MD : Marcene Corning  CHIEF COMPLAINT:  Rt hip pain  BRIEF PATIENT DESCRIPTION:  79 yo female with arthritis and Rt hip pain.  Had Rt total hip arthroplasty.  Developed altered mental status, abdominal discomfort and hypotension POD#1.  PCCM consulted 7/10 and assumed care.  SIGNIFICANT EVENTS: 7/08 Rt total hip arthroplasty 7/09 Change mental status, acute renal failure 7/10 VDRF  STUDIES:  7/09 CT head >> no acute findings.  Prior Lt MCA infarct and  left MCA M1 segment stent. 7/09 Abd u/s >> no acute findings 7/09 CT abd/pelvis >> stool filled colon, anterior abdominal wall hernia 7/10 Echo >> mild LVH, EF 65 to 70%, grade 1 diastolic dysfx, mild LA dilation  LINES / TUBES: 7/10 Lt IJ CVL >>  7/10 ETT >>  7/10 Rt radial aline >>   CULTURES: 7/09 Urine >> negative 7/09 Blood >> 7/09 Sputum >> Yeast  ANTIBIOTICS: Vancomycin 7/08 >>  Imipenem 7/09 >>   SUBJECTIVE:  No stool.  Tolerating SBT.  Remains on pressors, but weaning dose.  VITAL SIGNS: Temp:  [97.9 F (36.6 C)-100.4 F (38 C)] 97.9 F (36.6 C) (07/15 0756) Pulse Rate:  [33-117] 111 (07/15 0911) Resp:  [14-23] 18 (07/15 0911) BP: (88-165)/(32-75) 120/68 mmHg (07/15 0911) SpO2:  [95 %-100 %] 100 % (07/15 0700) FiO2 (%):  [30 %] 30 % (07/15 0912) Weight:  [207 lb 7.3 oz (94.1 kg)] 207 lb 7.3 oz (94.1 kg) (07/15 0500) HEMODYNAMICS: CVP:  [6 mmHg-39 mmHg] 7 mmHg VENTILATOR SETTINGS: Vent Mode:  [-] PSV FiO2 (%):  [30 %] 30 % Set Rate:  [16 bmp-20 bmp] 16 bmp Vt Set:  [480 mL] 480 mL PEEP:  [5 cmH20] 5 cmH20 Pressure Support:  [5 cmH20-8 cmH20] 5 cmH20 Plateau Pressure:  [15 cmH20] 15 cmH20 INTAKE / OUTPUT: Intake/Output     07/14 0701 - 07/15 0700 07/15 0701 - 07/16 0700   I.V. (mL/kg) 1795.7 (19.1)    Blood     Other     NG/GT 50    IV  Piggyback 700 100   Total Intake(mL/kg) 2545.7 (27.1) 100 (1.1)   Urine (mL/kg/hr) 1800 (0.8) 300 (1.2)   Emesis/NG output 700 (0.3) 450 (1.7)   Total Output 2500 750   Net +45.7 -650          PHYSICAL EXAMINATION: General: No distress Neuro:  Sleepy, wakes up easily, follows commands HEENT:  ETT in place Cardiovascular:  Irregular, tachycardic Lungs:  Scattered rhonchi Abdomen:  Soft, mild mid-epigastric tenderness, no rebound/guarding Musculoskeletal:  1+ edema Skin:  B/l toes dusky  LABS: CBC Recent Labs     12/08/12  0140  12/08/12  0743  12/09/12  0400  WBC  11.3*  14.9*  12.6*  HGB  6.5*  7.8*  7.8*  HCT  19.3*  22.9*  23.3*  PLT  121*  142*  143*    Coag's Recent Labs     12/07/12  1730  APTT  42*  INR  1.20    BMET Recent Labs     12/08/12  0140  12/08/12  1600  12/09/12  0400  NA  146*  149*  148*  K  3.3*  3.7  3.1*  CL  115*  116*  115*  CO2  18*  21  22  BUN  75*  69*  66*  CREATININE  1.79*  1.58*  1.49*  GLUCOSE  135*  123*  112*    Electrolytes Recent Labs     12/07/12  0500   12/08/12  0140  12/08/12  1600  12/09/12  0400  CALCIUM  8.4   < >  8.2*  8.5  8.6  MG  2.8*   --   2.5   --   2.3  PHOS  6.7*   --   6.2*   --   5.2*   < > = values in this interval not displayed.    Sepsis Markers Recent Labs     12/07/12  0500  12/08/12  0140  PROCALCITON  16.52  8.36   Liver Enzymes Recent Labs     12/07/12  1730  12/09/12  0500  AST  93*  60*  ALT  90*  43*  ALKPHOS  112  76  BILITOT  0.7  0.8  ALBUMIN  1.5*  1.5*    Cardiac Enzymes Recent Labs     12/08/12  0140  PROBNP  3055.0*    Glucose Recent Labs     12/08/12  1228  12/08/12  1651  12/08/12  1947  12/08/12  2344  12/09/12  0345  12/09/12  0755  GLUCAP  130*  122*  110*  109*  98  96    Imaging Dg Chest Port 1 View  12/09/2012   *RADIOLOGY REPORT*  Clinical Data: Evaluate endotracheal tube placement.  PORTABLE CHEST - 1 VIEW  Comparison: Chest  x-ray 12/07/2012.  Findings: An endotracheal tube is in place with tip 1.0 cm above the carina. There is a right-sided internal jugular central venous catheter with tip terminating in the superior cavoatrial junction. A nasogastric tube is seen extending into the stomach, however, the tip of the nasogastric tube extends below the lower margin of the image.  Lung volumes are low.  There are bibasilar opacities which may reflect areas of atelectasis and/or consolidation, with superimposed small to moderate bilateral pleural effusions (left greater than right), which appear increased.  Mild pulmonary venous congestion, without frank pulmonary edema.  Heart size appears borderline enlarged, likely accentuated by low lung volumes and patient rotation to the left which distorts the mediastinal contours.  Atherosclerosis of the thoracic aorta.  IMPRESSION: 1.  Support apparatus, as above.  Endotracheal tube is low lying, only 1 cm above the carina, and could be withdrawn approximately 3 cm for more optimal placement. 2.  Low lung volumes with persistent bibasilar atelectasis and/or consolidation, and increasing small to moderate bilateral pleural effusions (left greater than right). 3.  Atherosclerosis.   Original Report Authenticated By: Trudie Reed, M.D.   Dg Abd Portable 1v  12/09/2012   *RADIOLOGY REPORT*  Clinical Data: Abdominal pain and tenderness.  PORTABLE ABDOMEN - 1 VIEW  Comparison: Pelvic radiograph performed 12/07/2012, and CT of the abdomen and pelvis performed 12/03/2012  Findings: The visualized colon appears largely filled with stool, raising concern for some degree of constipation.  There is no evidence of small bowel dilatation to suggest obstruction.  No free intra-abdominal air is identified, though evaluation for free air suboptimal on a single supine view.   Clips are noted within the right upper quadrant, reflecting prior cholecystectomy.  The patient's enteric tube is noted ending overlying  the body of the stomach.  No acute osseous abnormalities are seen.  The patient's right hip arthroplasty is grossly unremarkable in appearance, though incompletely  imaged.  IMPRESSION: Relatively large stool burden noted, raising concern for some degree of constipation.  No evidence for bowel obstruction; no free intra-abdominal air seen.   Original Report Authenticated By: Tonia Ghent, M.D.   Dg Abd Portable 1v  12/07/2012   *RADIOLOGY REPORT*  Clinical Data: Abdominal pain  PORTABLE ABDOMEN - 1 VIEW  Comparison: 12/04/2012  Findings: NG tube in the stomach.  Negative for bowel obstruction. Moderate constipation with stool throughout the colon.  This is similar to the prior study.  Clips in the gallbladder fossa.  Right hip replacement in satisfactory position.  Levoscoliosis.  IMPRESSION: Constipation without bowel obstruction.   Original Report Authenticated By: Janeece Riggers, M.D.       ASSESSMENT / PLAN:  PULMONARY A: Acute respiratory failure 2nd to HAP. Hx of asthma. P:   -extubate 7/15 -f/u CXR -oxygen to keep SpO2 > 92% -bronchial hygiene -hold advair, singulair, spiriva for now  CARDIOVASCULAR A: Septic shock 2nd to pneumonia. Hx of HTN. P:  -wean off pressors to keep SBP > 90 -hold cardizem, hyzaar for now   RENAL A: Acute kidney injury 2nd to hypotension. Hypokalemia. Non gap metabolic acidosis >> resolved. P:   -monitor renal fx, urine outpt -f/u and replace electrolytes as needed  GASTROINTESTINAL A: Constipation >> digital rectal exam with liquid stool in rectal vault. Nutrition. Abdominal wall hernia >> CT findings d/w radiology >> likely benign findings. Hx of GERD. P:   -speech to assess swallowing after extubation >> NPO until then -tap water enema x 1 on 7/15 -protonix for SUP  HEMATOLOGIC A: Anemia of critical illness.  PRBC transfusion on 7/08, 7/09, 7/14. Thrombocytopenia. P:  -f/u CBC -transfuse for Hb < 7 -Lovenox for DVT prevention -check  anemia panel  INFECTIOUS A: HAP. P:   -D7/x Abx  ENDOCRINE A: Mild hyperglycemia >> no hx of DM. P:   -monitor blood sugar on BMET  NEUROLOGIC A: Acute encephalopathy 2nd to sepsis, hypoxia >> improved. Hx of CVA, depression, fibromyalgia, anxiety. Post-op pain control. P:   -d/c haldol, avoid benzo's -prn fentanyl  -hold ASA, plavix for now  -hold effexor for now  ORTHOPEDIC A: Arthritis s/p Rt hip arthroplasty. P: -post-op care per ortho -PT/OT  CC time 40 minutes.  Coralyn Helling, MD [facility] Pulmonary/Critical Care 12/09/2012, 10:16 AM Pager:  [phone] After 3pm call: [phone]

## 2012-12-09 NOTE — Procedures (Signed)
Extubation Procedure Note  Patient Details:   Name: Natalie Kent DOB: 07-20-1930 MRN: [id number]   Airway Documentation:   Weaned patient from ventilatory support and is now wearing a nasal cannula with oxygen running through at a rate of 4lpm. Right after extubation RN suctioned oropharynx for 100cc's deep green liquid. She then placed NG tube in patient.   Evaluation  O2 sats: stable throughout Complications: No apparent complications Patient did tolerate procedure well. Bilateral Breath Sounds: Clear;Diminished Suctioning: Airway Yes  Clearance Coots 12/09/2012, 11:05 AM

## 2012-12-09 NOTE — Progress Notes (Signed)
Subjective: 7 Days Post-Op Procedure(s) (LRB): TOTAL HIP ARTHROPLASTY ANTERIOR APPROACH (Right) Continues in ICU but off the ventilator. She is trying to communicate  Activity level:  Currently at bed rest Diet tolerance:  no Voiding:  Foley catheter Patient reports pain as 2 on 0-10 scale.    Objective: Vital signs in last 24 hours: Temp:  [97.9 F (36.6 C)-100.2 F (37.9 C)] 98.2 F (36.8 C) (07/15 1200) Pulse Rate:  [33-121] 121 (07/15 1118) Resp:  [14-23] 22 (07/15 1200) BP: (88-165)/(32-75) 119/46 mmHg (07/15 1200) SpO2:  [95 %-100 %] 100 % (07/15 1130) FiO2 (%):  [30 %] 30 % (07/15 0912) Weight:  [94.1 kg (207 lb 7.3 oz)] 94.1 kg (207 lb 7.3 oz) (07/15 0500)  Labs:  Recent Labs  12/07/12 0500 12/07/12 1620 12/08/12 0140 12/08/12 0743 12/09/12 0400  HGB 8.1* 7.3* 6.5* 7.8* 7.8*    Recent Labs  12/08/12 0743 12/09/12 0400  WBC 14.9* 12.6*  RBC 2.71* 2.71*  HCT 22.9* 23.3*  PLT 142* 143*    Recent Labs  12/08/12 1600 12/09/12 0400  NA 149* 148*  K 3.7 3.1*  CL 116* 115*  CO2 21 22  BUN 69* 66*  CREATININE 1.58* 1.49*  GLUCOSE 123* 112*  CALCIUM 8.5 8.6    Recent Labs  12/07/12 1730  INR 1.20    Physical Exam:  Neurologically intact Sensation intact distally Dorsiflexion/Plantar flexion intact Compartment soft Nurse will change dressing today. Right hip.  Assessment/Plan:  7 Days Post-Op Procedure(s) (LRB): TOTAL HIP ARTHROPLASTY ANTERIOR APPROACH (Right) Therapy is working with patient while in bed. New dressing today. Staples to be removed in one week. Spoke with Texie her daughter this morning.    Hulet Ehrmann R 12/09/2012, 12:23 PM

## 2012-12-10 ENCOUNTER — Inpatient Hospital Stay (HOSPITAL_COMMUNITY): Payer: Medicare Other

## 2012-12-10 LAB — GLUCOSE, CAPILLARY
Glucose-Capillary: 70 mg/dL (ref 70–99)
Glucose-Capillary: 77 mg/dL (ref 70–99)
Glucose-Capillary: 102 mg/dL — ABNORMAL HIGH (ref 70–99)
Glucose-Capillary: 89 mg/dL (ref 70–99)

## 2012-12-10 LAB — CULTURE, BLOOD (ROUTINE X 2): Culture: NO GROWTH

## 2012-12-10 LAB — CBC
MCV: 87.8 fL (ref 78.0–100.0)
Platelets: 158 10*3/uL (ref 150–400)
RDW: 17.1 % — ABNORMAL HIGH (ref 11.5–15.5)
WBC: 16.7 10*3/uL — ABNORMAL HIGH (ref 4.0–10.5)

## 2012-12-10 LAB — BASIC METABOLIC PANEL
Calcium: 8.9 mg/dL (ref 8.4–10.5)
Creatinine, Ser: 1.19 mg/dL — ABNORMAL HIGH (ref 0.50–1.10)
GFR calc Af Amer: 49 mL/min — ABNORMAL LOW (ref >90)

## 2012-12-10 MED ORDER — METOPROLOL TARTRATE 1 MG/ML IV SOLN
5.0000 mg | Freq: Four times a day (QID) | INTRAVENOUS | Status: DC
  Administered 2012-12-10 – 2012-12-12 (×8): 5 mg via INTRAVENOUS
  Filled 2012-12-10 (×13): qty 5

## 2012-12-10 MED ORDER — DEXTROSE 5 % IV SOLN
INTRAVENOUS | Status: DC
  Administered 2012-12-10: 50 mL via INTRAVENOUS
  Administered 2012-12-10 – 2012-12-12 (×4): via INTRAVENOUS
  Administered 2012-12-14: 10 mL via INTRAVENOUS

## 2012-12-10 MED ORDER — POTASSIUM CHLORIDE 10 MEQ/100ML IV SOLN
10.0000 meq | INTRAVENOUS | Status: AC
  Administered 2012-12-10 (×2): 10 meq via INTRAVENOUS
  Filled 2012-12-10: qty 200

## 2012-12-10 MED ORDER — SODIUM CHLORIDE 0.9 % IV SOLN
500.0000 mg | Freq: Three times a day (TID) | INTRAVENOUS | Status: DC
  Administered 2012-12-10 – 2012-12-12 (×5): 500 mg via INTRAVENOUS
  Filled 2012-12-10 (×7): qty 500

## 2012-12-10 MED ORDER — VANCOMYCIN HCL IN DEXTROSE 750-5 MG/150ML-% IV SOLN
750.0000 mg | Freq: Two times a day (BID) | INTRAVENOUS | Status: DC
  Administered 2012-12-10 – 2012-12-12 (×4): 750 mg via INTRAVENOUS
  Filled 2012-12-10 (×6): qty 150

## 2012-12-10 MED ORDER — LEVALBUTEROL TARTRATE 45 MCG/ACT IN AERO
6.0000 | INHALATION_SPRAY | Freq: Four times a day (QID) | RESPIRATORY_TRACT | Status: DC
  Administered 2012-12-10 (×2): 6 via RESPIRATORY_TRACT
  Filled 2012-12-10: qty 15

## 2012-12-10 MED ORDER — ENOXAPARIN SODIUM 40 MG/0.4ML ~~LOC~~ SOLN
40.0000 mg | SUBCUTANEOUS | Status: DC
  Administered 2012-12-11: 40 mg via SUBCUTANEOUS
  Filled 2012-12-10 (×2): qty 0.4

## 2012-12-10 MED ORDER — SORBITOL 70 % SOLN
960.0000 mL | TOPICAL_OIL | Freq: Once | ORAL | Status: AC
  Administered 2012-12-10: 960 mL via RECTAL
  Filled 2012-12-10: qty 240

## 2012-12-10 MED ORDER — LEVALBUTEROL TARTRATE 45 MCG/ACT IN AERO
2.0000 | INHALATION_SPRAY | Freq: Four times a day (QID) | RESPIRATORY_TRACT | Status: DC
  Administered 2012-12-11 (×2): 2 via RESPIRATORY_TRACT
  Filled 2012-12-10: qty 15

## 2012-12-10 MED ORDER — LEVALBUTEROL TARTRATE 45 MCG/ACT IN AERO
6.0000 | INHALATION_SPRAY | Freq: Four times a day (QID) | RESPIRATORY_TRACT | Status: DC
  Administered 2012-12-10: 6 via RESPIRATORY_TRACT

## 2012-12-10 NOTE — Progress Notes (Signed)
SLP Cancellation Note  Patient Details Name: Natalie Kent MRN: [id number] DOB: 05-14-1931   Cancelled treatment:        Unable to complete BSE at this time. Pt with NG in place.  RN reports unable to remove NG at this time due to continued output of bile. Will continue efforts.  Luceal Hollibaugh B. Murvin Natal [facility], CCC-SLP [phone]  [name] 12/10/2012, 9:26 AM

## 2012-12-10 NOTE — Progress Notes (Signed)
Bilateral toes and ball of feet discolored purple in color. Dr Craige Cotta aware states that he will follow up with vascular in [place]. Pt states that her feet are tender at times.

## 2012-12-10 NOTE — Progress Notes (Signed)
Enema given as ordered with light brown liquid stool return.

## 2012-12-10 NOTE — Progress Notes (Signed)
PULMONARY  / CRITICAL CARE MEDICINE  Name: Natalie Kent MRN: [id number] DOB: [birth year]    ADMISSION DATE:  11/26/2012 CONSULTATION DATE:  12/04/2012  REFERRING MD : Marcene Corning  CHIEF COMPLAINT:  Rt hip pain  BRIEF PATIENT DESCRIPTION:  79 yo female with arthritis and Rt hip pain.  Had Rt total hip arthroplasty.  Developed altered mental status, abdominal discomfort and hypotension POD#1.  PCCM consulted 7/10 and assumed care.  SIGNIFICANT EVENTS: 7/08 Rt total hip arthroplasty 7/09 Change mental status, acute renal failure 7/10 VDRF 7/15 Extubated 7/16 Still no bowel movement, wheezing on exam  STUDIES:  7/09 CT head >> no acute findings.  Prior Lt MCA infarct and  left MCA M1 segment stent. 7/09 Abd u/s >> no acute findings 7/09 CT abd/pelvis >> stool filled colon, anterior abdominal wall hernia 7/10 Echo >> mild LVH, EF 65 to 70%, grade 1 diastolic dysfx, mild LA dilation 7/15 ABD xray >> relatively large stool burden  LINES / TUBES: 7/10 Lt IJ CVL >>  7/10 ETT >>7/15  7/10 Rt radial aline >> out  CULTURES: 7/09 Urine >> negative 7/09 Blood >> no growth 7/09 Sputum >> Yeast  ANTIBIOTICS: Vancomycin 7/08 >>  Imipenem 7/09 >>   SUBJECTIVE:  Still no stool s/p tap water enema. Off pressors.  VITAL SIGNS: Temp:  [97.3 F (36.3 C)-98.2 F (36.8 C)] 98.2 F (36.8 C) (07/16 0819) Pulse Rate:  [25-125] 125 (07/16 0819) Resp:  [18-33] 23 (07/16 0819) BP: (100-142)/(30-108) 142/30 mmHg (07/16 0800) SpO2:  [81 %-100 %] 96 % (07/16 0849) FiO2 (%):  [30 %] 30 % (07/15 0912) Weight:  [92.3 kg (203 lb 7.8 oz)] 92.3 kg (203 lb 7.8 oz) (07/16 0500)  HEMODYNAMICS: CVP:  [2 mmHg-11 mmHg] 2 mmHg  VENTILATOR SETTINGS: Vent Mode:  [-] PSV FiO2 (%):  [30 %] 30 % PEEP:  [5 cmH20] 5 cmH20 Pressure Support:  [5 cmH20] 5 cmH20  INTAKE / OUTPUT: Intake/Output     07/15 0701 - 07/16 0700 07/16 0701 - 07/17 0700   I.V. (mL/kg) 591.1 (6.4)    NG/GT     IV Piggyback 500     Total Intake(mL/kg) 1091.1 (11.8)    Urine (mL/kg/hr) 1150 (0.5)    Emesis/NG output 800 (0.4)    Total Output 1950     Net -858.9            PHYSICAL EXAMINATION: General: Elderly ill appear female in no distress Neuro:  Sleepy, but wakes easily, follows commands. MAE HEENT:  large neck, bruising noted to right neck Cardiovascular:  Irregular, tachycardic, heart sound distant Lungs:  Scattered rhonchi and wheezes. No stridor Abdomen:  Obese and soft, mild mid-epigastric tenderness, no rebound/guarding Musculoskeletal:  1+ edema Skin:  B/l toes dusky and mottled  LABS: CBC Recent Labs     12/08/12  0743  12/09/12  0400  12/10/12  0400  WBC  14.9*  12.6*  16.7*  HGB  7.8*  7.8*  7.8*  HCT  22.9*  23.3*  23.7*  PLT  142*  143*  158   Coag's Recent Labs     12/07/12  1730  APTT  42*  INR  1.20   BMET Recent Labs     12/08/12  1600  12/09/12  0400  12/10/12  0400  NA  149*  148*  151*  K  3.7  3.1*  3.4*  CL  116*  115*  119*  CO2  21  22  22  BUN  69*  66*  57*  CREATININE  1.58*  1.49*  1.19*  GLUCOSE  123*  112*  86   Electrolytes Recent Labs     12/08/12  0140  12/08/12  1600  12/09/12  0400  12/10/12  0400  CALCIUM  8.2*  8.5  8.6  8.9  MG  2.5   --   2.3   --   PHOS  6.2*   --   5.2*   --    Sepsis Markers Recent Labs     12/08/12  0140  PROCALCITON  8.36   Liver Enzymes Recent Labs     12/07/12  1730  12/09/12  0500  AST  93*  60*  ALT  90*  43*  ALKPHOS  112  76  BILITOT  0.7  0.8  ALBUMIN  1.5*  1.5*   Cardiac Enzymes Recent Labs     12/08/12  0140  PROBNP  3055.0*   Glucose Recent Labs     12/09/12  1115  12/09/12  1643  12/09/12  1935  12/09/12  2339  12/10/12  0337  12/10/12  0817  GLUCAP  96  96  92  77  70  77   Imaging Dg Chest Port 1 View  12/10/2012   *RADIOLOGY REPORT*  Clinical Data: Pneumonia.  PORTABLE CHEST - 1 VIEW  Comparison: 12/09/2012  Findings: The patient has been extubated.  Nasogastric tube  and central line remain in stable position.  Lungs show some improvement in aeration with residual atelectasis/infiltrates remaining in both bases.  No edema or large pleural effusions are identified.  IMPRESSION: Improved aeration bilaterally.   Original Report Authenticated By: Irish Lack, M.D.   Dg Chest Port 1 View  12/09/2012   *RADIOLOGY REPORT*  Clinical Data: Evaluate endotracheal tube placement.  PORTABLE CHEST - 1 VIEW  Comparison: Chest x-ray 12/07/2012.  Findings: An endotracheal tube is in place with tip 1.0 cm above the carina. There is a right-sided internal jugular central venous catheter with tip terminating in the superior cavoatrial junction. A nasogastric tube is seen extending into the stomach, however, the tip of the nasogastric tube extends below the lower margin of the image.  Lung volumes are low.  There are bibasilar opacities which may reflect areas of atelectasis and/or consolidation, with superimposed small to moderate bilateral pleural effusions (left greater than right), which appear increased.  Mild pulmonary venous congestion, without frank pulmonary edema.  Heart size appears borderline enlarged, likely accentuated by low lung volumes and patient rotation to the left which distorts the mediastinal contours.  Atherosclerosis of the thoracic aorta.  IMPRESSION: 1.  Support apparatus, as above.  Endotracheal tube is low lying, only 1 cm above the carina, and could be withdrawn approximately 3 cm for more optimal placement. 2.  Low lung volumes with persistent bibasilar atelectasis and/or consolidation, and increasing small to moderate bilateral pleural effusions (left greater than right). 3.  Atherosclerosis.   Original Report Authenticated By: Trudie Reed, M.D.   Dg Abd Portable 1v  12/09/2012   *RADIOLOGY REPORT*  Clinical Data: NG tube placement, respiratory failure  PORTABLE ABDOMEN - 1 VIEW  Comparison: Plain films 12/09/2012, CT 12/03/2012  Findings: The NG tube  loops above the diaphragms likely within the large hiatal hernia seen on comparison CT.  IMPRESSION: NG tube looped back within the hiatal hernia above the hemidiaphragms.   Original Report Authenticated By: Genevive Bi, M.D.   Dg Abd Portable  1v  12/09/2012   *RADIOLOGY REPORT*  Clinical Data: Abdominal pain and tenderness.  PORTABLE ABDOMEN - 1 VIEW  Comparison: Pelvic radiograph performed 12/07/2012, and CT of the abdomen and pelvis performed 12/03/2012  Findings: The visualized colon appears largely filled with stool, raising concern for some degree of constipation.  There is no evidence of small bowel dilatation to suggest obstruction.  No free intra-abdominal air is identified, though evaluation for free air suboptimal on a single supine view.   Clips are noted within the right upper quadrant, reflecting prior cholecystectomy.  The patient's enteric tube is noted ending overlying the body of the stomach.  No acute osseous abnormalities are seen.  The patient's right hip arthroplasty is grossly unremarkable in appearance, though incompletely imaged.  IMPRESSION: Relatively large stool burden noted, raising concern for some degree of constipation.  No evidence for bowel obstruction; no free intra-abdominal air seen.   Original Report Authenticated By: Tonia Ghent, M.D.    ASSESSMENT / PLAN:  PULMONARY A: Acute respiratory failure 2nd to HAP - PCXR on 7/16 shows stable bilateral lower lobe infiltrates vs effusions Hx of asthma. P:   -Scheduled BD for now as wheezing on exam, xopenex given tachycardia -F/u CXR in AM -oxygen to keep SpO2 > 92% -bronchial hygiene, IS ordered -Chest PT via BED -Up OOB   CARDIOVASCULAR A: Septic shock 2nd to pneumonia. Hx of HTN. AFIB - on Cardizem and hyzaar at home B/l digital ischemia of feet >> ? Related to pressors P:  -Maintain SBP > 90 -Scheduled metoprolol -Holding cardizem and hyzaar related to GI motility issues -may need evaluation by  vascular surgery   RENAL A: Acute kidney injury - 2/2 to hypotension, Cr improved to 1.19 on 7/16. Hypokalemia. Non gap metabolic acidosis >> resolved. Negative -858 on 7/16 P:   -Potassium supplementation -Monitor renal fx and electrolytes -Keep magnesium greater than 2.0 -Follow I&O  GASTROINTESTINAL A: Constipation - digital rectal exam with liquid stool in rectal vault. Previously attempted lactulose and tap water enema without results. Takes miralax at home Malnutrition Abdominal wall hernia >> CT findings d/w radiology >> likely benign findings. Hx of GERD. P:   -NPO pending resolution of bowel dysfunction -SMOG enema -protonix for SUP  HEMATOLOGIC A: Anemia of chronic illness/iron deficiency -  PRBC transfusion on 7/08, 7/09, 7/14. Low iron, TIBC, and saturation ratio on 7/15 Thrombocytopenia - resolved P:  -Holding on iron supplementation given not hypotensive and issues with constipation and current infectious process -f/u CBC -transfuse for Hb < 7 -Lovenox for DVT prevention -Check coags  INFECTIOUS A: HAP - bilateral lower lobe opacities on chest xray. WBC increased 7/16 to 16.7. P:   -Culture data as above -Continue ABX per dashboard -Reculture if febrile -Follow WBC and fever curve  ENDOCRINE A: Mild hyperglycemia >> no hx of DM. P:   -monitor blood sugar on BMET  NEUROLOGIC A: Acute encephalopathy - 2/2 to sepsis, hypoxia - improved, although still solmulent. Hx of CVA, depression, fibromyalgia, anxiety. Post-op pain control. P:   -avoid benzo's -prn fentanyl  -hold effexor for now  ORTHOPEDIC A: Arthritis s/p Rt hip arthroplasty. P: -post-op care per ortho -PT/OT  Updated family at bedside.  Richard A. Rosebrock, S-ACNP  Coralyn Helling, MD [facility] Pulmonary/Critical Care 12/10/2012, 8:54 AM Pager:  [phone] After 3pm call: [phone]

## 2012-12-10 NOTE — Progress Notes (Signed)
eLink Nursing ICU Electrolyte Replacement Protocol  Patient Name: Natalie Kent DOB: 08-10-1930 MRN: [id number]  Date of Service  12/10/2012   HPI/Events of Note    Recent Labs Lab 12/04/12 1741 12/05/12 0350  12/07/12 0500 12/07/12 1730 12/08/12 0140 12/08/12 1600 12/09/12 0400 12/10/12 0400  NA 141 142  < > 146* 148* 146* 149* 148* 151*  K 4.9 4.5  < > 3.2* 3.6 3.3* 3.7 3.1* 3.4*  CL 111 111  < > 116* 117* 115* 116* 115* 119*  CO2 17* 18*  < > 19 20 18* 21 22 22   GLUCOSE 59* 126*  < > 91 98 135* 123* 112* 86  BUN 60* 59*  < > 72* 71* 75* 69* 66* 57*  CREATININE 2.65* 2.29*  < > 1.86* 1.78* 1.79* 1.58* 1.49* 1.19*  CALCIUM 8.4 8.0*  < > 8.4 8.1* 8.2* 8.5 8.6 8.9  MG  --  3.2*  --  2.8*  --  2.5  --  2.3  --   PHOS  --  7.4*  --  6.7*  --  6.2*  --  5.2*  --   < > = values in this interval not displayed.  Estimated Creatinine Clearance: 44.3 ml/min (by C-G formula based on Cr of 1.19).  Intake/Output     07/15 0701 - 07/16 0700   I.V. (mL/kg) 529.4 (5.6)   IV Piggyback 500   Total Intake(mL/kg) 1029.4 (10.9)   Urine (mL/kg/hr) 700 (0.3)   Emesis/NG output 450 (0.2)   Total Output 1150   Net -120.6        - I/O DETAILED x24h    Total I/O In: 220 [I.V.:220] Out: 200 [Urine:200] - I/O THIS SHIFT    ASSESSMENT Pt does not meet inclusion criteria for eLINK electrolyte replacement protocol  eICURN Interventions  K 3.4 reported to eMD   ASSESSMENT: MAJOR ELECTROLYTE    Ardelle Park 12/10/2012, 5:30 AM

## 2012-12-10 NOTE — Progress Notes (Addendum)
Subjective: 8 Days Post-Op Procedure(s) (LRB): TOTAL HIP ARTHROPLASTY ANTERIOR APPROACH (Right) Patient is extubated and able to speak. Does have ng tube. Her daughter is at bedside. Activity level:  sit in chair. No increase in her pain doing that. May be weightbearing as tolerated right side. Diet tolerance:  Restricted Voiding:  Foley catheter Patient reports pain as 2 on 0-10 scale.    Objective: Vital signs in last 24 hours: Temp:  [97.3 F (36.3 C)-98.2 F (36.8 C)] 98.2 F (36.8 C) (07/16 0819) Pulse Rate:  [25-125] 125 (07/16 0819) Resp:  [20-33] 22 (07/16 1100) BP: (100-186)/(30-119) 141/119 mmHg (07/16 1100) SpO2:  [81 %-100 %] 96 % (07/16 0849) Weight:  [92.3 kg (203 lb 7.8 oz)] 92.3 kg (203 lb 7.8 oz) (07/16 0500)  Labs:  Recent Labs  12/07/12 1620 12/08/12 0140 12/08/12 0743 12/09/12 0400 12/10/12 0400  HGB 7.3* 6.5* 7.8* 7.8* 7.8*    Recent Labs  12/09/12 0400 12/10/12 0400  WBC 12.6* 16.7*  RBC 2.71* 2.70*  HCT 23.3* 23.7*  PLT 143* 158    Recent Labs  12/09/12 0400 12/10/12 0400  NA 148* 151*  K 3.1* 3.4*  CL 115* 119*  CO2 22 22  BUN 66* 57*  CREATININE 1.49* 1.19*  GLUCOSE 112* 86  CALCIUM 8.6 8.9    Recent Labs  12/07/12 1730  INR 1.20    Physical Exam:  Incision: dressing C/D/I No cellulitis present Compartment soft Right hip incision appears normal. Internal and external rotation of right hip minimally painful. This is normal for hip replacement. Multiple toes on both feet are discolored  Assessment/Plan:  8 Days Post-Op Procedure(s) (LRB): TOTAL HIP ARTHROPLASTY ANTERIOR APPROACH (Right) Up with therapy bed to chair transfers. May be weightbearing as tolerated on right hip when okay with medical team.  No sign of infection right hip at this time. She continues on Lovenox at this time as well. Her daughter was in the room at the time of my visit.    Elajah Kunsman R 12/10/2012, 1:01 PM

## 2012-12-10 NOTE — Progress Notes (Signed)
Physical Therapy Treatment Patient Details Name: Natalie Kent MRN: [id number] DOB: [birth year] Today's Date: 12/10/2012 Time: 4098-1191 PT Time Calculation (min): 36 min  PT Assessment / Plan / Recommendation  PT Comments   Pt tolerated EOB much better this date. Pt transferred to chair however pt with minimal ability to assist. Pt with minimal R LE WBing . Pt slowly progressing and con't to benefit from SNF placement upon d/c due to pt currently requiring total assist for all mobility. Acute PT to con't to follow and progress mobility.   Follow Up Recommendations  SNF     Does the patient have the potential to tolerate intense rehabilitation     Barriers to Discharge        Equipment Recommendations  None recommended by PT    Recommendations for Other Services    Frequency Min 5X/week   Progress towards PT Goals Progress towards PT goals: Progressing toward goals  Plan Current plan remains appropriate;Frequency needs to be updated    Precautions / Restrictions Precautions Precautions: Fall Precaution Comments: MRSA Restrictions Weight Bearing Restrictions: Yes RLE Weight Bearing: Weight bearing as tolerated   Pertinent Vitals/Pain Pt reports minimal R Hip pain    Mobility  Bed Mobility Bed Mobility: Supine to Sit;Sitting - Scoot to Edge of Bed Supine to Sit: 1: +2 Total assist;HOB elevated Supine to Sit: Patient Percentage: 10% Sitting - Scoot to Edge of Bed: 1: +2 Total assist Sitting - Scoot to Edge of Bed: Patient Percentage: 10% Details for Bed Mobility Assistance: assist for trunk elevation and LE management, step by step verbal cues required to sequence and use upper body to assist. Transfers Sit to Stand: 1: +2 Total assist;With upper extremity assist;From bed Sit to Stand: Patient Percentage: 10% Stand to Sit: 1: +2 Total assist;To chair/3-in-1;With upper extremity assist Stand to Sit: Patient Percentage: 10% Stand Pivot Transfers: 1: +2 Total assist;From  elevated surface Stand Pivot Transfers: Patient Percentage: 10% Details for Transfer Assistance: transferred using pad with +2 total assist, pt 10% to chair, lift pad placed for return to bed with nursing staff Ambulation/Gait Ambulation/Gait Assistance: Not tested (comment)    Exercises Total Joint Exercises Ankle Circles/Pumps: AROM;Both;Supine Quad Sets: AROM;Both;10 reps;Supine Long Arc Quad: AROM;Both;20 reps;Seated   PT Diagnosis:    PT Problem List:   PT Treatment Interventions:     PT Goals (current goals can now be found in the care plan section) Acute Rehab PT Goals Patient Stated Goal: Daughter is agreeable to SNF prior to return home.  Visit Information  Last PT Received On: 12/09/12 Assistance Needed: +2 History of Present Illness: s/p elective R THA who required intubation due to resp failure. pt extubated this date at 1040am    Subjective Data  Subjective: Pt received supine in bed agreeable to sit EOB Patient Stated Goal: Daughter is agreeable to SNF prior to return home.   Cognition  Cognition Arousal/Alertness: Awake/alert Behavior During Therapy: Flat affect Overall Cognitive Status: Impaired/Different from baseline Area of Impairment: Awareness (decreased initiation, )    Balance  Static Sitting Balance Static Sitting - Balance Support: Feet supported;Bilateral upper extremity supported Static Sitting - Level of Assistance: 4: Min assist Static Sitting - Comment/# of Minutes: 10 - completed seated bilat LAQ  End of Session PT - End of Session Equipment Utilized During Treatment: Gait belt;Oxygen Activity Tolerance: Patient limited by fatigue Patient left: in chair;with call bell/phone within reach;with family/visitor present Nurse Communication: Mobility status   GP     Pansie Guggisberg,  Becky Sax 12/10/2012, 2:21 PM  Lewis Shock, PT, DPT Pager #: [phone] Office #: [phone]

## 2012-12-10 NOTE — Progress Notes (Signed)
eLink Physician-Brief Progress Note Patient Name: Natalie Kent DOB: 1931-04-22 MRN: [id number]  Date of Service  12/10/2012   HPI/Events of Note   hypoglycemic and not tolerating tube feeds   eICU Interventions   change KVO to D5 water   Intervention Category Minor Interventions: Other:  Seema Blum 12/10/2012, 5:01 AM

## 2012-12-10 NOTE — Progress Notes (Signed)
Occupational Therapy Treatment Patient Details Name: Natalie Kent MRN: [id number] DOB: November 28, 1930 Today's Date: 12/10/2012 Time: 4098-1191 OT Time Calculation (min): 36 min  OT Assessment / Plan / Recommendation  OT comments  Resumed OT following intubation. Pt requires +2 total assist for all mobility.  Decreased initiation, minimal verbalization although pt was alert and participatory. Pt with markedly deconditioned now.  Daughters observed session and agree pt will need further rehab prior to return home.  Follow Up Recommendations  SNF    Barriers to Discharge       Equipment Recommendations  None recommended by OT    Recommendations for Other Services    Frequency Min 2X/week   Progress towards OT Goals Progress towards OT goals: Not progressing toward goals - comment (medical decline since last visit)  Plan Discharge plan remains appropriate    Precautions / Restrictions Precautions Precautions: Fall Precaution Comments: MRSA Restrictions Weight Bearing Restrictions: Yes RLE Weight Bearing: Weight bearing as tolerated   Pertinent Vitals/Pain No distress, monitored throughout, pain with movement of R LE, repositioned    ADL  Eating/Feeding: Set up (ice chips) Where Assessed - Eating/Feeding: Bed level Equipment Used: Gait belt Transfers/Ambulation Related to ADLs: transferred with use of gait belt and pad +2 total assist pt 10%, decreased ability to WB on LEs.    OT Diagnosis:    OT Problem List:   OT Treatment Interventions:     OT Goals(current goals can now be found in the care plan section) Acute Rehab OT Goals Patient Stated Goal: Daughter is agreeable to SNF prior to return home.  Visit Information  Last OT Received On: 12/10/12 Assistance Needed: +2 PT/OT Co-Evaluation/Treatment: Yes History of Present Illness: s/p elective R THA who required intubation due to resp failure. pt extubated this date at 1040am    Subjective Data      Prior  Functioning  Home Living Family/patient expects to be discharged to:: Private residence Living Arrangements: Children Available Help at Discharge: Family;Available 24 hours/day Type of Home: House Home Access: Stairs to enter [employer] of Steps: 1 Entrance Stairs-Rails: None Home Layout: One level Home Equipment: Walker - 2 wheels;Bedside commode;Shower seat;Grab bars - toilet;Grab bars - tub/shower;Hand held shower head;Other (comment) Additional Comments: handicap acessible bathroom  Prior Function Level of Independence: Independent with assistive device(s) Comments: patient's daughter is disabled so decreased physical assistance available. Communication Communication: No difficulties Dominant Hand: Right    Cognition  Cognition Arousal/Alertness: Awake/alert Behavior During Therapy: Flat affect Overall Cognitive Status: Impaired/Different from baseline Area of Impairment: Awareness (decreased initiation, )    Mobility  Bed Mobility Bed Mobility: Supine to Sit;Sitting - Scoot to Edge of Bed Supine to Sit: 1: +2 Total assist;HOB elevated Supine to Sit: Patient Percentage: 10% Sitting - Scoot to Edge of Bed: 1: +2 Total assist Sitting - Scoot to Edge of Bed: Patient Percentage: 10% Details for Bed Mobility Assistance: assist for trunk elevation and LE management, step by step verbal cues required to sequence and use upper body to assist. Transfers Transfers: Sit to Stand;Stand to Sit Sit to Stand: 1: +2 Total assist;With upper extremity assist;From bed Sit to Stand: Patient Percentage: 10% Stand to Sit: 1: +2 Total assist;To chair/3-in-1;With upper extremity assist Stand to Sit: Patient Percentage: 10% Details for Transfer Assistance: transferred using pad with +2 total assist, pt 10% to chair, lift pad placed for return to bed with nursing staff    Exercises      Balance Static Sitting Balance Static Sitting -  Balance Support: Feet supported;Bilateral upper  extremity supported Static Sitting - Level of Assistance: 4: Min assist Static Sitting - Comment/# of Minutes: 10   End of Session OT - End of Session Activity Tolerance: Patient tolerated treatment well Patient left: in chair;with family/visitor present Nurse Communication: Mobility status;Need for lift equipment  GO     Evern Bio 12/10/2012, 1:59 PM [phone]

## 2012-12-10 NOTE — Progress Notes (Addendum)
ANTIBIOTIC and ANTICOAGULATION CONSULT NOTE - FOLLOW UP  Pharmacy Consult for imipenem, vancomycin, Lovenox Indication: pneumonia, VTE prophylaxis after TKA  Allergies  Allergen Reactions  . Solarcaine Aloe Extra (Lidocaine Hcl)   . Septra (Bactrim)   . Cephalexin Rash    Patient Measurements: Height: 5\' 6"  (167.6 cm) Weight: 203 lb 7.8 oz (92.3 kg) IBW/kg (Calculated) : 59.3   Vital Signs: Temp: 98.2 F (36.8 C) (07/16 0819) Temp src: Core (Comment) (07/16 0819) BP: 131/50 mmHg (07/16 0900) Pulse Rate: 125 (07/16 0819) Intake/Output from previous day: 07/15 0701 - 07/16 0700 In: 1291.1 [I.V.:591.1; IV Piggyback:700] Out: 1950 [Urine:1150; Emesis/NG output:800] Intake/Output from this shift: Total I/O In: 100 [I.V.:100] Out: 125 [Urine:125]  Labs:  Recent Labs  12/08/12 0743 12/08/12 1600 12/09/12 0400 12/10/12 0400  WBC 14.9*  --  12.6* 16.7*  HGB 7.8*  --  7.8* 7.8*  PLT 142*  --  143* 158  CREATININE  --  1.58* 1.49* 1.19*   Estimated Creatinine Clearance: 43.9 ml/min (by C-G formula based on Cr of 1.19).  Recent Labs  12/08/12 1115  VANCOTROUGH 26.8*     Assessment: 81 YOF s/p total hip on lovenox and PNA on imipenem and vancomycin . Her renal function has been improving over the last several days (SCr today= 1.19 and CrCl ~45).  Patient has been on prophylactic Lovenox 30mg  subq Q24h since 7/11. She received PRBC transfusions on 7/8, 7/9 and 7/14. Of note on 7/14 Hg was 6.5 with coffee ground emesis in the NGT- no further episode has been noted. Hg has been stable the last 3 days. An anemia panel on 7/15 shows Iron= 29 and % sat = 19%.  Goal of Therapy:  Vancomycin trough level 15-20 mcg/ml  Plan:  -Change vancomycin to 750mg  IV q12hr -Change imipenem to 500mg  IV q8h -Day 8 of antibiotic therapy. Consider establishing stop dates for antibiotics. -Increase lovenox to 40mg  [place] q24hr and will watch Hg closely  Harland German, Pharm D 12/10/2012 11:21  AM

## 2012-12-11 ENCOUNTER — Inpatient Hospital Stay (HOSPITAL_COMMUNITY): Payer: Medicare Other

## 2012-12-11 DIAGNOSIS — R0989 Other specified symptoms and signs involving the circulatory and respiratory systems: Secondary | ICD-10-CM

## 2012-12-11 DIAGNOSIS — I70269 Atherosclerosis of native arteries of extremities with gangrene, unspecified extremity: Secondary | ICD-10-CM

## 2012-12-11 LAB — APTT: aPTT: 46 seconds — ABNORMAL HIGH (ref 24–37)

## 2012-12-11 LAB — BASIC METABOLIC PANEL
Calcium: 9.1 mg/dL (ref 8.4–10.5)
GFR calc Af Amer: 56 mL/min — ABNORMAL LOW (ref >90)
GFR calc non Af Amer: 49 mL/min — ABNORMAL LOW (ref >90)
Glucose, Bld: 103 mg/dL — ABNORMAL HIGH (ref 70–99)
Potassium: 3.3 mEq/L — ABNORMAL LOW (ref 3.5–5.1)
Sodium: 150 mEq/L — ABNORMAL HIGH (ref 135–145)

## 2012-12-11 LAB — HEMOGLOBIN AND HEMATOCRIT, BLOOD
HCT: 24.3 % — ABNORMAL LOW (ref 36.0–46.0)
Hemoglobin: 8 g/dL — ABNORMAL LOW (ref 12.0–15.0)

## 2012-12-11 LAB — GLUCOSE, CAPILLARY
Glucose-Capillary: 73 mg/dL (ref 70–99)
Glucose-Capillary: 94 mg/dL (ref 70–99)

## 2012-12-11 LAB — CBC
HCT: 23 % — ABNORMAL LOW (ref 36.0–46.0)
Hemoglobin: 7.5 g/dL — ABNORMAL LOW (ref 12.0–15.0)
MCHC: 32.6 g/dL (ref 30.0–36.0)
RBC: 2.56 MIL/uL — ABNORMAL LOW (ref 3.87–5.11)

## 2012-12-11 LAB — PROTIME-INR: INR: 1.4 (ref 0.00–1.49)

## 2012-12-11 MED ORDER — POTASSIUM CHLORIDE 10 MEQ/50ML IV SOLN
10.0000 meq | INTRAVENOUS | Status: AC
  Administered 2012-12-11 (×3): 10 meq via INTRAVENOUS

## 2012-12-11 MED ORDER — LEVALBUTEROL HCL 0.63 MG/3ML IN NEBU
0.6300 mg | INHALATION_SOLUTION | RESPIRATORY_TRACT | Status: DC | PRN
  Administered 2012-12-13 – 2012-12-18 (×5): 0.63 mg via RESPIRATORY_TRACT
  Filled 2012-12-11 (×5): qty 3

## 2012-12-11 MED ORDER — HYDROMORPHONE HCL PF 1 MG/ML IJ SOLN
1.0000 mg | Freq: Once | INTRAMUSCULAR | Status: AC
  Administered 2012-12-11: 1 mg via INTRAVENOUS
  Filled 2012-12-11: qty 1

## 2012-12-11 MED ORDER — POTASSIUM CHLORIDE 10 MEQ/50ML IV SOLN
INTRAVENOUS | Status: AC
  Administered 2012-12-11: 10 meq via INTRAVENOUS
  Filled 2012-12-11: qty 200

## 2012-12-11 MED ORDER — HYDROMORPHONE HCL PF 1 MG/ML IJ SOLN
0.5000 mg | INTRAMUSCULAR | Status: DC | PRN
  Administered 2012-12-11 (×2): 0.5 mg via INTRAVENOUS
  Administered 2012-12-11 (×2): 0.25 mg via INTRAVENOUS
  Administered 2012-12-12: 0.5 mg via INTRAVENOUS
  Administered 2012-12-12 (×2): 1 mg via INTRAVENOUS
  Administered 2012-12-12 (×2): 0.25 mg via INTRAVENOUS
  Administered 2012-12-12: 0.5 mg via INTRAVENOUS
  Administered 2012-12-13 – 2012-12-14 (×3): 1 mg via INTRAVENOUS
  Administered 2012-12-15: 0.5 mg via INTRAVENOUS
  Administered 2012-12-15: 1 mg via INTRAVENOUS
  Administered 2012-12-15 (×2): 0.5 mg via INTRAVENOUS
  Administered 2012-12-15 (×2): 1 mg via INTRAVENOUS
  Administered 2012-12-16 (×2): 0.5 mg via INTRAVENOUS
  Filled 2012-12-11 (×20): qty 1

## 2012-12-11 NOTE — Evaluation (Signed)
Clinical/Bedside Swallow Evaluation Patient Details  Name: Natalie Kent MRN: [id number] Date of Birth: July 25, 1930  Today's Date: 12/11/2012 Time: 4098-1191 SLP Time Calculation (min): 43 min  Past Medical History:  Past Medical History  Diagnosis Date  . DVT (deep venous thrombosis)   . Asthma   . Arthritis   . Hypertension   . Right rib fracture   . Fibromyalgia   . Mental disorder   . Anxiety   . Depression   . Pneumonia     hx  . Peripheral vascular disease 70's    groin blood clot  . GERD (gastroesophageal reflux disease)   . H/O hiatal hernia   . Anemia     hx  . Dysrhythmia     "irregular" heart rate --- history of sinus arrhythmia  . Stroke 03/2005   Past Surgical History:  Past Surgical History  Procedure Laterality Date  . Abdominal hysterectomy    . Cholecystectomy    . Knee surgery Bilateral   . Endovascular stent insertion      left MCA stent 08/2005, 09/2006, intracranial angioplasty 01/2007  . Eye surgery Bilateral     cat  . Varicose vein surgery    . Total hip arthroplasty Right 11/26/2012    Procedure: TOTAL HIP ARTHROPLASTY ANTERIOR APPROACH;  Surgeon: Velna Ochs, MD;  Location: [facility] OR;  Service: Orthopedics;  Laterality: Right;  DEPUY   HPI:      Assessment / Plan / Recommendation Clinical Impression  Pt exhibits no overt s/s aspiration during bedside evaluation, however, she is significantly weak, and still lethargic from medication earlier today.  At this point, bites of puree and sips of thin liquid are recommended, as pt requests.  PO presentation only when she is alert and awake.  Given weakness at this time, it is unlikely that she will successfully meet nutritional/hydration needs per mouth, so consideration of nonoral supplements is recommended.  Additionally, given history of prior CVA,  recent intubation, and weakness, MBS to objectively assess swallow function and safety is recommended.      Aspiration Risk  Moderate    Diet  Recommendation Dysphagia 1 (Puree);Thin liquid   Liquid Administration via: Straw Medication Administration: Via alternative means Supervision: Staff feed patient;Full supervision/cueing for compensatory strategies Compensations: Slow rate;Small sips/bites (provide po only when pt is alert and awake.) Postural Changes and/or Swallow Maneuvers: Seated upright 90 degrees;Upright 30-60 min after meal    Other  Recommendations Recommended Consults: MBS Oral Care Recommendations: Oral care before and after PO;Staff/trained caregiver to provide oral care   Follow Up Recommendations  24 hour supervision/assistance    Frequency and Duration  (pending MBS results)   (pending MBS results)   Pertinent Vitals/Pain No pain reported    SLP Swallow Goals Patient will consume recommended diet without observed clinical signs of aspiration with: Total assistance Swallow Study Goal #1 - Progress: Progressing toward goal Patient will utilize recommended strategies during swallow to increase swallowing safety with: Total assistance Swallow Study Goal #2 - Progress: Progressing toward goal   Swallow Study Prior Functional Status   Tolerating regular diet at home    General Date of Onset: 11/26/2012 Type of Study: Bedside swallow evaluation Diet Prior to this Study: NPO Temperature Spikes Noted: No Respiratory Status: Supplemental O2 delivered via (comment) ([place]@5L ) History of Recent Intubation: Yes Length of Intubations (days): 5 days Behavior/Cognition: Lethargic;Requires cueing;Pleasant mood Oral Cavity - Dentition: Dentures, not available;Edentulous Self-Feeding Abilities: Total assist Patient Positioning: Upright in bed Baseline Vocal  Quality: Clear Volitional Cough: Weak Volitional Swallow: Unable to elicit    Oral/Motor/Sensory Function Overall Oral Motor/Sensory Function: Appears within functional limits for tasks assessed   Ice Chips Ice chips: Within functional limits Presentation:  Spoon   Thin Liquid Thin Liquid: Within functional limits Presentation: Cup;Spoon;Straw Other Comments: difficulty using straw effectively    Nectar Thick Nectar Thick Liquid: Not tested   Honey Thick Honey Thick Liquid: Not tested   Puree Puree: Within functional limits Presentation: Spoon   Solid   Natalie Kent [facility], CCC-SLP [phone]    Solid: Not tested       Natalie Kent 12/11/2012,3:58 PM

## 2012-12-11 NOTE — Progress Notes (Signed)
Physical Therapy Treatment Patient Details Name: Natalie Kent MRN: [id number] DOB: 09/18/1930 Today's Date: 12/11/2012 Time: 4098-1191 PT Time Calculation (min): 17 min  PT Assessment / Plan / Recommendation  PT Comments   Pt demonstrates little/no participation with activity today.  Circulation deficits evident in right fingers (nsg made aware) improved slightly with PROM. Worked with ROM secondary to recent THA. Patient very stiff and limited. Will continue to work with patient and progress activity as tolerated.   Follow Up Recommendations  CIR           Equipment Recommendations  None recommended by PT       Frequency 7X/week   Progress towards PT Goals  Patient not progressing  Plan Current plan remains appropriate    Precautions / Restrictions Precautions Precautions: Fall;Anterior Hip Precaution Comments: MRSA Restrictions Weight Bearing Restrictions: Yes RLE Weight Bearing: Weight bearing as tolerated   Pertinent Vitals/Pain No value given    Mobility  Bed Mobility Bed Mobility: Rolling Right Rolling Right: 1: +2 Total assist Rolling Right: Patient Percentage: 10% Transfers Transfers: Not assessed Ambulation/Gait Ambulation/Gait Assistance: Not tested (comment)    Exercises Total Joint Exercises Ankle Circles/Pumps: PROM;Both;20 reps Heel Slides: PROM;Both;20 reps;Supine Hip ABduction/ADduction: PROM;Both;20 reps Straight Leg Raises: PROM;Both;20 reps   PT Diagnosis:    PT Problem List:   PT Treatment Interventions:     PT Goals (current goals can now be found in the care plan section) Acute Rehab PT Goals Patient Stated Goal: Unable due to lethargy and cogntive status PT Goal Formulation: With patient/family Time For Goal Achievement: 12/09/12 Potential to Achieve Goals: Fair  Visit Information  Last PT Received On: 12/11/12 Assistance Needed: +2 History of Present Illness: s/p elective R THA anterior approach, pt now with septic shock, UTI,  acute kidney injury    Subjective Data  Subjective: patient with some moans but no specific verbilizations Patient Stated Goal: Unable due to lethargy and cogntive status   Cognition  Cognition Arousal/Alertness: Lethargic Overall Cognitive Status: Difficult to assess Difficult to assess due to: Level of arousal (Pt sedated on vent at this time)       End of Session PT - End of Session Activity Tolerance: Patient limited by lethargy Patient left: in bed;with call bell/phone within reach;with nursing/sitter in room Nurse Communication: Mobility status   GP     Fabio Asa 12/11/2012, 1:16 PM Charlotte Crumb, PT DPT  [phone]

## 2012-12-11 NOTE — Progress Notes (Signed)
eLink Physician-Brief Progress Note Patient Name: Natalie Kent DOB: 05-20-1931 MRN: [id number]  Date of Service  12/11/2012   HPI/Events of Note   C/io bilateral burning pain in both feet around toes and soles. Camera exam shows no hyper-esthesia or allodynuia or hyperestheia on dorsum of feet but presenst on feet.   Toes look blue as well. RN says bilateral dorsalis pedis present  Has received fentanyl x 3 without relief (  eICU Interventions  chck lactate bedsode MD to evaluate Test dose with dilaudid 1mg  (reduced25% equivalent dose of fentanyl)   Intervention Category Intermediate Interventions: Pain - evaluation and management  Ruhee Enck 12/11/2012, 6:22 AM

## 2012-12-11 NOTE — Progress Notes (Signed)
VASCULAR LAB PRELIMINARY  ARTERIAL  ABI completed:    RIGHT    LEFT    PRESSURE WAVEFORM  PRESSURE WAVEFORM  BRACHIAL 116  triphasic BRACHIAL 118 triphasic  DP   DP    AT 111 Dampened monophasic AT 109 monophasic  PT 129 monophasic PT 131 triphasic  PER   PER    GREAT TOE  flat GREAT TOE  flat    RIGHT LEFT  ABI >1.0 >1.0     Maryclaire Stoecker, RVT 12/11/2012, 4:40 PM

## 2012-12-11 NOTE — Care Management Note (Signed)
    Page 1 of 2   12/11/2012     10:16:32 AM   CARE MANAGEMENT NOTE 12/11/2012  Patient:  Natalie Kent, Natalie Kent   Account Number:  [id number]  Date Initiated:  11/29/2012  Documentation initiated by:  [facility]  Subjective/Objective Assessment:   admitted postop rt total hip arthroplasty     Action/Plan:   plan return home with HHPT vs SNF  PT/OT evals-recommending SNF   Anticipated DC Date:  12/05/2012   Anticipated DC Plan:  HOME W HOME HEALTH SERVICES  In-house referral  Clinical Social Worker      DC Associate Professor  CM consult      [facility] Choice  HOME HEALTH   Choice offered to / List presented to:          [facility] arranged  HH-1 RN  HH-2 PT      [facility] agency  [facility]   Status of service:  Completed, signed off Medicare Important Message given?   (If response is "NO", the following Medicare IM given date fields will be blank) Date Medicare IM given:   Date Additional Medicare IM given:    Discharge Disposition:    Per UR Regulation:    If discussed at Long Length of Stay Meetings, dates discussed:   12/09/2012  12/11/2012    Comments:  7/17  1014 debbie Brach [place] rn,bsn sw following for poss snf, phy ther and occup ther rec snf.  7/11  0950 debbie Erisha Paugh rn,bsn pt on vent. spoke w fam. they are not sure if hhc was aranged pta. spoke w dr daldorff's office and they had made hhc ref to [facility]. alerted [facility] of pt. will cont to follow for hhc vs snf dep on progress.  12/04/12 1543 Xferred to 2600 2/2 hypotension.  Received referral to arrange home health PT.   Per staff RN, BP now 70s systolic, temp 101.7, new orders received.  Await medical stability.

## 2012-12-11 NOTE — Consult Note (Addendum)
[facility] Consult  Reason for Consult:  Discolored toes bilaterally  Referring Physician:  CCM  [id number]   History of Present Illness: This is a 79 y.o. female with complicated hospitalization presents cc: dusky toes.  The pt is unable to provide history due to altered mental status.  From the chart, during this admission:  1.  Underwent R THR 2.  Acute on chronic renal failure 3.  Pneumonia with septic shock qui 4.  Afib. 5.  GI bleed  On 12/11/12, pt had complaints of burning in both feet and bluish color to toes.  Pt was weaned off vasopressors on 12/09/12.  Past Medical History  Diagnosis Date  . DVT (deep venous thrombosis)   . Asthma   . Arthritis   . Hypertension   . Right rib fracture   . Fibromyalgia   . Mental disorder   . Anxiety   . Depression   . Pneumonia     hx  . Peripheral vascular disease 70's    groin blood clot  . GERD (gastroesophageal reflux disease)   . H/O hiatal hernia   . Anemia     hx  . Dysrhythmia     "irregular" heart rate --- history of sinus arrhythmia  . Stroke 03/2005    Past Surgical History  Procedure Laterality Date  . Abdominal hysterectomy    . Cholecystectomy    . Knee surgery Bilateral   . Endovascular stent insertion      left MCA stent 08/2005, 09/2006, intracranial angioplasty 01/2007  . Eye surgery Bilateral     cat  . Varicose vein surgery    . Total hip arthroplasty Right 12/11/2012    Procedure: TOTAL HIP ARTHROPLASTY ANTERIOR APPROACH;  Surgeon: Velna Ochs, MD;  Location: [facility] OR;  Service: Orthopedics;  Laterality: Right;  DEPUY    History   Social History  . Marital Status: Widowed    Spouse Name: N/A    Number of Children: N/A  . Years of Education: N/A   Occupational History  . Not on file.   Social History Main Topics  . Smoking status: Never Smoker   . Smokeless tobacco: Never Used  . Alcohol Use: No  . Drug Use: No  . Sexually Active: Not on file   Other Topics Concern   . Not on file   Social History Narrative  . No narrative on file    FamHx Daughter not certain pt's family hx.  Pt has altered mental status  No current facility-administered medications on file prior to encounter.   Current Outpatient Prescriptions on File Prior to Encounter  Medication Sig Dispense Refill  . clopidogrel (PLAVIX) 75 MG tablet Take 75 mg by mouth every other day.       . diltiazem (CARDIZEM CD) 240 MG 24 hr capsule Take 240 mg by mouth daily.        Marland Kitchen losartan-hydrochlorothiazide (HYZAAR) 100-25 MG per tablet Take 1 tablet by mouth daily.        . montelukast (SINGULAIR) 10 MG tablet Take 10 mg by mouth at bedtime.      . pantoprazole (PROTONIX) 40 MG tablet Take 40 mg by mouth daily.      . polyethylene glycol (MIRALAX / GLYCOLAX) packet Take 17 g by mouth daily as needed (for constipation).       . venlafaxine XR (EFFEXOR-XR) 150 MG 24 hr capsule Take 150 mg by mouth every morning.       Marland Kitchen albuterol (  PROVENTIL HFA;VENTOLIN HFA) 108 (90 BASE) MCG/ACT inhaler Inhale 2 puffs into the lungs every 6 (six) hours as needed for shortness of breath.         Allergies  Allergen Reactions  . Solarcaine Aloe Extra (Lidocaine Hcl)   . Septra (Bactrim)   . Cephalexin Rash   ROS: [x]  Positive   [ ]  Negative   [ ]  All sytems reviewed and are negative  Cardiovascular: []  chest pain/pressure []  palpitations []  SOB lying flat []  DOE []  pain in legs while walking [x]  pain in feet - bilateral toes [x]  hx of DVT- []  hx of phlebitis []  swelling in legs []  varicose veins  Pulmonary: []  productive cough []  asthma []  wheezing  Neurologic: [x]  hx CVA with stenting in brain []  weakness in []  arms []  legs []  numbness in []  arms []  legs [] difficulty speaking or slurred speech []  temporary loss of vision in one eye []  dizziness  Hematologic: []  bleeding problems []  problems with blood clotting easily  Endocrine:   []  diabetes []  thyroid disease  GI [x]   constipation []  vomiting blood []  blood in stool  GU: []  burning with urination []  blood in urine  Psychiatric: []  hx of major depression  Integumentary: []  rashes []  ulcers [x]  discolored toes for 3 days and have progressively worsened.    Constitutional: [x]  fever post operatively []  chills  Musculoskeletal: [x]  recent hip replacement  Physical Examination  Filed Vitals:   12/11/12 1300 12/11/12 1400 12/11/12 1500 12/11/12 1600  BP: 103/43 108/38 130/73 131/45  Pulse: 64 86 95 94  Temp:    99.5 F (37.5 C)  TempSrc:    Core (Comment)  Resp: 13 19 23 19   Height:      Weight:      SpO2: 100% 100% 100% 100%    Body mass index is 32.72 kg/(m^2).  General: WD, Obese, confused   Head: [place]/AT  Ear/Nose/Throat: Hearing grossly intact, nares w/o erythema or drainage, oropharynx w/o Erythema/Exudate, Mallampati score: 3  Eyes: PERRLA, EOMI  Neck: Supple, no nuchal rigidity, no palpable LAD  Pulmonary: Sym exp, good air movt, CTAB, no rales, rhonchi, & wheezing  Cardiac: RRR, Nl S1, S2, no Murmurs, rubs or gallops  Vascular: Vessel Right Left  Radial Palpable Faintly Palpable  Brachial Palpable Palpable  Carotid Palpable, without bruit Palpable, without bruit  Aorta Not palpable N/A  Femoral Difficult to Palpate due to pannus Difficult to Palpate due to pannus  Popliteal Not palpable Not palpable  PT Faintly Palpable Faintly Palpable  DP Faintly Palpable Faintly Palpable   Gastrointestinal: soft, NTND, -G/R, - HSM, - masses, - CVAT B  Musculoskeletal: Pt unable to follow instructions for motor exam, Extremities without ischemic changes except  R 2nd distal phalange cyanosis, generalized duskiness in hands, B feet with multiple toes with ischemic appearance c/w embolic phenomena vs. vasopressor induced ischemia  Neurologic: unable to test due to altered mental status  Psychiatric: unable to test due to altered mental status  Dermatologic: See M/S exam for  extremity exam, no rashes otherwise noted  Lymph : No Cervical, Axillary, or Inguinal lymphadenopathy   Laboratory: CBC:    Component Value Date/Time   WBC 17.9* 12/11/2012 0400   RBC 2.56* 12/11/2012 0400   HGB 7.5* 12/11/2012 0400   HCT 23.0* 12/11/2012 0400   PLT 204 12/11/2012 0400   MCV 89.8 12/11/2012 0400   MCH 29.3 12/11/2012 0400   MCHC 32.6 12/11/2012 0400   RDW 17.3* 12/11/2012 0400  LYMPHSABS 1.2 12/20/2012 2035   MONOABS 1.6* 11/28/2012 2035   EOSABS 0.0 12/25/2012 2035   BASOSABS 0.0 12/15/2012 2035    BMP:    Component Value Date/Time   NA 150* 12/11/2012 0400   K 3.3* 12/11/2012 0400   CL 119* 12/11/2012 0400   CO2 23 12/11/2012 0400   GLUCOSE 103* 12/11/2012 0400   BUN 47* 12/11/2012 0400   CREATININE 1.06 12/11/2012 0400   CALCIUM 9.1 12/11/2012 0400   GFRNONAA 49* 12/11/2012 0400   GFRAA 56* 12/11/2012 0400    Coagulation: Lab Results  Component Value Date   INR 1.40 12/11/2012   INR 1.20 12/07/2012   INR 1.58* 12/05/2012   No results found for this basename: PTT    Radiology: Dg Chest Port 1 View  12/11/2012   *RADIOLOGY REPORT*  Clinical Data: Evaluate infiltrates  PORTABLE CHEST - 1 VIEW  Comparison: 12/10/2012  Findings: Cardiomediastinal silhouette is stable.  Stable NG tube position.  Stable right IJ central line position.  Elevation of the left hemidiaphragm again noted.  Persistent left basilar small atelectasis or infiltrate.  Mild right base atelectasis medially. No pulmonary edema.  Atherosclerotic calcifications of thoracic aorta again noted.  IMPRESSION: Stable NG tube position.  Stable right IJ central line position. Elevation of the left hemidiaphragm again noted.  Persistent left basilar small atelectasis or infiltrate.  Mild right base atelectasis medially. No pulmonary edema   Original Report Authenticated By: Natasha Mead, M.D.   Dg Chest Port 1 View  12/10/2012   *RADIOLOGY REPORT*  Clinical Data: Pneumonia.  PORTABLE CHEST - 1 VIEW  Comparison: 12/09/2012   Findings: The patient has been extubated.  Nasogastric tube and central line remain in stable position.  Lungs show some improvement in aeration with residual atelectasis/infiltrates remaining in both bases.  No edema or large pleural effusions are identified.  IMPRESSION: Improved aeration bilaterally.   Original Report Authenticated By: Irish Lack, M.D.   Non-Invasive Vascular Imaging:  ABI's pending  TTE 12/04/12: Study Conclusions  - Left ventricle: The cavity size was normal. Wall thickness was increased in a pattern of mild LVH. There was mild focal basal hypertrophy of the septum. Systolic function was vigorous. The estimated ejection fraction was in the range of 65% to 70%. Wall motion was normal; there were noregional wall motion abnormalities. Doppler parameters areconsistent with abnormal left ventricular relaxation(grade 1 diastolic dysfunction). - Mitral valve: There was severe systolic anterior motion of the anterior leaflet and posterior leaflet. - Left atrium: The atrium was mildly dilated. - Pulmonic valve: Peak gradient: Hg (S). - Pericardium, extracardiac: A trivial pericardial effusion was identified.   ASSESSMENT/PLAN: This is a 79 y.o. female who is s/p total hip replacement with complicated hospital course including AFib, shock, and VDRF now off vent.  -unable to obtain history from pt as she has had dilaudid 0.5mg  and is very sleepy.  History is obtained from chart and daughter. -pt does have doppler signals in bilateral feet and both are warm.  This could be possible embolic event to toes as pt has been in AFib and not on anticoagulation.  -pt was on Phenylephrine from 12/07/12 and weaned 12/10/12-possibly side effect from vasoconstriction as her discoloration started the day after neo was started -per her daughter, she does have hx of "blood clots" -pt has been on plavix b/c of stent in brain after mini stroke per daughter. -ABI's are ordered -Lovenox 40  mg given this afternoon.   -will discuss further with  Dr. Imogene Burn as he will see the pt this afternoon.   Doreatha Massed, PA-C [facility] [phone]  Addendum  I have independently interviewed and examined the patient, and I agree with the physician assistant's findings.  Pt has findings in R hand and B feet consistent with either embolic phenomena vs vasopressor induced distal phalange ischemia.  There likely may be some degree of PAD in both legs also.  ABI are pending.  Regardless, only immediate intermediate intervention is full anticoagulation if no contraindication at this point.  The thought is that anticoagulation will give her own body a chance to lyse any thrombus in distal digital arteries.  There is no surgical intervention for such disease.  Given her previous episode of GI bleeding, it is not clear to me if there is a relative contraindication.  Embolic source work-up needs to be completed also: echo, CTA chest/abd/pelvis with B leg runoff.  Further intervention dependent on the results of the work-up.  I will defer to the primary team the speed of such work-up given the patient's only recent recovery from shock.  Leonides Sake, MD [facility] [place] Office: [phone] Pager: [phone]  12/11/2012, 5:51 PM  Addendum:  BLE ABI: report finalized by myself  Normal ABI B  Hyperemia waveforms in all tibial arteries  Suspect biphasic waveforms in both PT   Suspect biphasic waveform in L DP  ABI are somewhat misleading due to hyperemia.  I suspect eventually the ABI will reflect mild to moderate disease in the R leg with mild disease in L leg.     - No change to my prior recommendations.  Leonides Sake, MD [facility] [place] Office: [phone] Pager: [phone]  12/11/2012, 6:43 PM

## 2012-12-11 NOTE — Progress Notes (Signed)
SLP Cancellation Note  Patient Details Name: Natalie Kent MRN: [id number] DOB: Jul 21, 1930   Cancelled treatment:        Continued attempts to complete BSE.  NG has now been removed, however, pt given dilaudid and is currently insufficiently arousable for evaluation.  Will continue efforts.  RN aware.  Celia B. Murvin Natal [facility], CCC-SLP [phone]  [name] 12/11/2012, 12:39 PM

## 2012-12-11 NOTE — Progress Notes (Signed)
NUTRITION FOLLOW UP  Intervention:   1.  Food/Beverage; resume of PO diet per MD discretion based on medical appropriateness. 2.  Enteral nutrition; Question whether resume of trickle feeds after severe constipation resolved would assist with diet progression. Recommend Vital 1.2 @ 10 mL/hr continuous if warranted.  3.  Parenteral nutrition; if pt not appropriate for gastric feeds, recommend initiation of TPN as pt has been NPO x9 days.  Nutrition Dx:   Inadequate oral intake, ongoing.  Monitor:   1.  Enteral nutrition; initiation with tolerance.  Pt to meet >/=90% estimated needs with nutrition support.   Not met, pt TF's held due to poor tolerance.  2.  Wt/wt change; monitor trends.  Ongoing. 3.  Food/Beverage; pt meeting >/=90% estimated needs with tolerance once diet appropriate and advanced. 4.  Parenteral nutrition; initiation with tolerance.  Management per PharmD.   Assessment:   Pt presented for hip surgery.  Experience worsening abdominal pain and septic shock r/t pneumonia. Pt recently extubated, but has not yet advanced diet.   NGT removed.  Pt having bowel movements.  Pt received TFs briefly over the weekend, but has largely been NPO x9 days.   Height: Ht Readings from Last 1 Encounters:  12/04/12 5\' 6"  (1.676 m)    Weight Status:   Wt Readings from Last 1 Encounters:  12/11/12 202 lb 9.6 oz (91.9 kg)    Re-estimated needs:  Kcal: 1880-2070 Protein: 75-93g Fluid: >1.8 L/day  Skin: intact  Diet Order: NPO   Intake/Output Summary (Last 24 hours) at 12/11/12 1213 Last data filed at 12/11/12 1000  Gross per 24 hour  Intake   1800 ml  Output   1425 ml  Net    375 ml    Last BM: 7/16 with enema   Labs:   Recent Labs Lab 12/07/12 0500  12/08/12 0140  12/09/12 0400 12/10/12 0400 12/11/12 0400  NA 146*  < > 146*  < > 148* 151* 150*  K 3.2*  < > 3.3*  < > 3.1* 3.4* 3.3*  CL 116*  < > 115*  < > 115* 119* 119*  CO2 19  < > 18*  < > 22 22 23   BUN 72*   < > 75*  < > 66* 57* 47*  CREATININE 1.86*  < > 1.79*  < > 1.49* 1.19* 1.06  CALCIUM 8.4  < > 8.2*  < > 8.6 8.9 9.1  MG 2.8*  --  2.5  --  2.3  --  2.1  PHOS 6.7*  --  6.2*  --  5.2*  --   --   GLUCOSE 91  < > 135*  < > 112* 86 103*  < > = values in this interval not displayed.  CBG (last 3)   Recent Labs  12/11/12 12/11/12 0344 12/11/12 0837  GLUCAP 73 94 87    Scheduled Meds: . chlorhexidine  15 mL Mouth/Throat BID  . [START ON 01/01/2013] cyanocobalamin  1,000 mcg Intramuscular Q30 days  . enoxaparin (LOVENOX) injection  40 mg Subcutaneous Q24H  . imipenem-cilastatin  500 mg Intravenous Q8H  . metoprolol  5 mg Intravenous Q6H  . pantoprazole (PROTONIX) IV  40 mg Intravenous Daily  . potassium chloride  10 mEq Intravenous Q1 Hr x 4  . vancomycin  750 mg Intravenous Q12H    Continuous Infusions: . dextrose 25 mL/hr at 12/11/12 1000    Loyce Dys, MS RD LDN Clinical Inpatient Dietitian Pager: [phone] Weekend/After hours pager: [phone]

## 2012-12-11 NOTE — Progress Notes (Signed)
PULMONARY  / CRITICAL CARE MEDICINE  Name: DELANY STEURY MRN: [id number] DOB: [birth year]    ADMISSION DATE:  12/01/2012 CONSULTATION DATE:  12/04/2012  REFERRING MD : Marcene Corning  CHIEF COMPLAINT:  Rt hip pain  BRIEF PATIENT DESCRIPTION:  79 yo female with arthritis and Rt hip pain.  Had Rt total hip arthroplasty.  Developed altered mental status, abdominal discomfort and hypotension POD#1.  PCCM consulted 7/10 and assumed care.  SIGNIFICANT EVENTS: 7/08 Rt total hip arthroplasty 7/09 Change mental status, acute renal failure 7/10 VDRF 7/15 Extubated 7/16 Still no bowel movement, wheezing on exam 7/17 VVS consult for lower extremity digital ischemia  STUDIES:  7/09 CT head >> no acute findings.  Prior Lt MCA infarct and  left MCA M1 segment stent. 7/09 Abd u/s >> no acute findings 7/09 CT abd/pelvis >> stool filled colon, anterior abdominal wall hernia 7/10 Echo >> mild LVH, EF 65 to 70%, grade 1 diastolic dysfx, mild LA dilation 7/15 ABD xray >> relatively large stool burden  LINES / TUBES: 7/10 Lt IJ CVL >>  7/10 ETT >>7/15  7/10 Rt radial aline >> out  CULTURES: 7/09 Urine >> negative 7/09 Blood >> no growth 7/09 Sputum >> Yeast  ANTIBIOTICS: Vancomycin 7/08 >>  Imipenem 7/09 >>   SUBJECTIVE:  Feet more painful.  Had BM x 2.  Feels thirsty.  VITAL SIGNS: Temp:  [97.5 F (36.4 C)-98.6 F (37 C)] 97.5 F (36.4 C) (07/17 0400) Pulse Rate:  [25-107] 88 (07/17 0700) Resp:  [15-28] 16 (07/17 0700) BP: (107-186)/(37-119) 120/52 mmHg (07/17 0700) SpO2:  [88 %-100 %] 99 % (07/17 0815) Weight:  [202 lb 9.6 oz (91.9 kg)] 202 lb 9.6 oz (91.9 kg) (07/17 0500)  HEMODYNAMICS: CVP:  [4 mmHg] 4 mmHg  INTAKE / OUTPUT: Intake/Output     07/16 0701 - 07/17 0700 07/17 0701 - 07/18 0700   I.V. (mL/kg) 1200 (13.1) 50 (0.5)   IV Piggyback 600    Total Intake(mL/kg) 1800 (19.6) 50 (0.5)   Urine (mL/kg/hr) 1425 (0.6) 125 (0.6)   Emesis/NG output     Total Output 1425  125   Net +375 -75          PHYSICAL EXAMINATION: General: Elderly ill appear female in no distress Neuro:  Sleepy, but wakes easily, follows commands. MAE HEENT:  NG tube in place Cardiovascular: regular Lungs: no wheeze Abdomen:  Obese and soft, mild mid-epigastric tenderness, no rebound/guarding Musculoskeletal:  1+ edema Skin:  B/l toes dusky and mottled  LABS: CBC Recent Labs     12/09/12  0400  12/10/12  0400  12/11/12  0400  WBC  12.6*  16.7*  17.9*  HGB  7.8*  7.8*  7.5*  HCT  23.3*  23.7*  23.0*  PLT  143*  158  204   Coag's Recent Labs     12/11/12  0400  APTT  46*  INR  1.40   BMET Recent Labs     12/09/12  0400  12/10/12  0400  12/11/12  0400  NA  148*  151*  150*  K  3.1*  3.4*  3.3*  CL  115*  119*  119*  CO2  22  22  23   BUN  66*  57*  47*  CREATININE  1.49*  1.19*  1.06  GLUCOSE  112*  86  103*   Electrolytes Recent Labs     12/09/12  0400  12/10/12  0400  12/11/12  0400  CALCIUM  8.6  8.9  9.1  MG  2.3   --   2.1  PHOS  5.2*   --    --    Liver Enzymes Recent Labs     12/09/12  0500  AST  60*  ALT  43*  ALKPHOS  76  BILITOT  0.8  ALBUMIN  1.5*   Glucose Recent Labs     12/10/12  1154  12/10/12  1635  12/10/12  1935 12/11/12  12/11/12  0344  12/11/12  0837  GLUCAP  88  102*  89  73  94  87   Imaging Dg Chest Port 1 View  12/11/2012   *RADIOLOGY REPORT*  Clinical Data: Evaluate infiltrates  PORTABLE CHEST - 1 VIEW  Comparison: 12/10/2012  Findings: Cardiomediastinal silhouette is stable.  Stable NG tube position.  Stable right IJ central line position.  Elevation of the left hemidiaphragm again noted.  Persistent left basilar small atelectasis or infiltrate.  Mild right base atelectasis medially. No pulmonary edema.  Atherosclerotic calcifications of thoracic aorta again noted.  IMPRESSION: Stable NG tube position.  Stable right IJ central line position. Elevation of the left hemidiaphragm again noted.  Persistent left  basilar small atelectasis or infiltrate.  Mild right base atelectasis medially. No pulmonary edema   Original Report Authenticated By: Natasha Mead, M.D.   Dg Chest Port 1 View  12/10/2012   *RADIOLOGY REPORT*  Clinical Data: Pneumonia.  PORTABLE CHEST - 1 VIEW  Comparison: 12/09/2012  Findings: The patient has been extubated.  Nasogastric tube and central line remain in stable position.  Lungs show some improvement in aeration with residual atelectasis/infiltrates remaining in both bases.  No edema or large pleural effusions are identified.  IMPRESSION: Improved aeration bilaterally.   Original Report Authenticated By: Irish Lack, M.D.   Dg Abd Portable 1v  12/09/2012   *RADIOLOGY REPORT*  Clinical Data: NG tube placement, respiratory failure  PORTABLE ABDOMEN - 1 VIEW  Comparison: Plain films 12/09/2012, CT 12/03/2012  Findings: The NG tube loops above the diaphragms likely within the large hiatal hernia seen on comparison CT.  IMPRESSION: NG tube looped back within the hiatal hernia above the hemidiaphragms.   Original Report Authenticated By: Genevive Bi, M.D.    ASSESSMENT / PLAN:  PULMONARY A: Acute respiratory failure 2nd to HAP - PCXR on 7/16 shows stable bilateral lower lobe infiltrates vs effusions Hx of asthma. P:   -Scheduled BD for now as wheezing on exam, xopenex given tachycardia -F/u CXR intermittently -oxygen to keep SpO2 > 92% -bronchial hygiene, IS ordered   CARDIOVASCULAR A: Septic shock 2nd to pneumonia. Hx of HTN. AFIB - on Cardizem and hyzaar at home B/l digital ischemia of feet >> ? Related to pressors P:  -Maintain SBP > 90 -Scheduled metoprolol -Holding cardizem and hyzaar related to GI motility issues -vascular surgery consulted >> order ABI's   RENAL A: Acute kidney injury - 2/2 to hypotension, Cr improved to 1.19 on 7/16. Hypokalemia. Non gap metabolic acidosis >> resolved. P:   -Potassium supplementation -Monitor renal fx and  electrolytes -Follow I&O  GASTROINTESTINAL A: Constipation >> improved 7/17 with SMOG enema. Malnutrition Abdominal wall hernia >> CT findings d/w radiology >> likely benign findings. Hx of GERD. P:   -d/c NG tube -speech to assess swallowing and then advance diet -protonix for SUP  HEMATOLOGIC A: Anemia of chronic illness/iron deficiency -  PRBC transfusion on 7/08, 7/09, 7/14. Low iron, TIBC, and saturation ratio on 7/15 Thrombocytopenia -  resolved P:  -Holding on iron supplementation given not hypotensive and issues with constipation and current infectious process -f/u CBC -transfuse 1 unit PRBC 7/17 -Lovenox for DVT prevention  INFECTIOUS A: HAP - bilateral lower lobe opacities on chest xray. WBC increased 7/16 to 16.7. P:   -Culture data as above -Continue ABX per dashboard -Reculture if febrile -Follow WBC and fever curve  ENDOCRINE A: Mild hyperglycemia >> no hx of DM. P:   -monitor blood sugar on BMET  NEUROLOGIC A: Acute encephalopathy - 2/2 to sepsis, hypoxia - improved, although still solmulent. Hx of CVA, depression, fibromyalgia, anxiety. Post-op pain control. Pain from lower extremity ischemia. P:   -avoid benzo's -prn dilaudid -hold effexor for now  ORTHOPEDIC A: Arthritis s/p Rt hip arthroplasty. P: -post-op care per ortho -PT/OT  Updated family at bedside.  Coralyn Helling, MD [facility] Pulmonary/Critical Care 12/11/2012, 9:29 AM Pager:  [phone] After 3pm call: [phone]

## 2012-12-11 NOTE — Progress Notes (Signed)
Subjective: 9 Days Post-Op Procedure(s) (LRB): TOTAL HIP ARTHROPLASTY ANTERIOR APPROACH (Right) Patient currently in bed and sedated. She is minimally arousable. Activity level:  Currently at bed rest but may be weightbearing as tolerated right side when appropriate Diet tolerance:  Not Voiding:  Foley catheter Patient reports pain as difficult to assess because of sedation. Yesterday was able to voice that her hip did not hurt and sitting in chair.    Objective: Vital signs in last 24 hours: Temp:  [97 F (36.1 C)-99.7 F (37.6 C)] 99.5 F (37.5 C) (07/17 1125) Pulse Rate:  [25-107] 101 (07/17 1100) Resp:  [15-28] 17 (07/17 1100) BP: (107-161)/(37-96) 110/39 mmHg (07/17 1125) SpO2:  [88 %-100 %] 99 % (07/17 0815) Weight:  [91.9 kg (202 lb 9.6 oz)] 91.9 kg (202 lb 9.6 oz) (07/17 0500)  Labs:  Recent Labs  12/09/12 0400 12/10/12 0400 12/11/12 0400  HGB 7.8* 7.8* 7.5*    Recent Labs  12/10/12 0400 12/11/12 0400  WBC 16.7* 17.9*  RBC 2.70* 2.56*  HCT 23.7* 23.0*  PLT 158 204    Recent Labs  12/10/12 0400 12/11/12 0400  NA 151* 150*  K 3.4* 3.3*  CL 119* 119*  CO2 22 23  BUN 57* 47*  CREATININE 1.19* 1.06  GLUCOSE 86 103*  CALCIUM 8.9 9.1    Recent Labs  12/11/12 0400  INR 1.40    Physical Exam:  Compartment soft Right hip incision with minimal drainage and small dark area that appears to be old blood about 1 cm in size. Thigh with mild-moderate swelling appropriate with hip replacement surgery. Hip rotation and flexion does not elicit severe pain. Toes on both feet are very dark and dusky Assessment/Plan:  9 Days Post-Op Procedure(s) (LRB): TOTAL HIP ARTHROPLASTY ANTERIOR APPROACH (Right) I spoke with Dr. Turner Daniels in he will come by this afternoon and evaluate patient. I did speak to a family member after my visit with [facility]. Vascular consult has been called.   Jary Louvier R 12/11/2012, 12:54 PM

## 2012-12-12 LAB — TYPE AND SCREEN
ABO/RH(D): A NEG
Antibody Screen: NEGATIVE
Unit division: 0
Unit division: 0

## 2012-12-12 LAB — HEPARIN LEVEL (UNFRACTIONATED): Heparin Unfractionated: <0.1 IU/mL — ABNORMAL LOW (ref 0.30–0.70)

## 2012-12-12 LAB — CBC
HCT: 25.4 % — ABNORMAL LOW (ref 36.0–46.0)
Hemoglobin: 8.3 g/dL — ABNORMAL LOW (ref 12.0–15.0)
MCH: 29 pg (ref 26.0–34.0)
MCHC: 32.7 g/dL (ref 30.0–36.0)
RBC: 2.86 MIL/uL — ABNORMAL LOW (ref 3.87–5.11)

## 2012-12-12 LAB — BASIC METABOLIC PANEL
BUN: 37 mg/dL — ABNORMAL HIGH (ref 6–23)
Chloride: 116 mEq/L — ABNORMAL HIGH (ref 96–112)
GFR calc Af Amer: 58 mL/min — ABNORMAL LOW (ref >90)
GFR calc non Af Amer: 50 mL/min — ABNORMAL LOW (ref >90)
Glucose, Bld: 94 mg/dL (ref 70–99)
Potassium: 3.8 mEq/L (ref 3.5–5.1)
Sodium: 145 mEq/L (ref 135–145)

## 2012-12-12 MED ORDER — TIOTROPIUM BROMIDE MONOHYDRATE 18 MCG IN CAPS
18.0000 ug | ORAL_CAPSULE | Freq: Every day | RESPIRATORY_TRACT | Status: DC
  Administered 2012-12-12 – 2012-12-18 (×6): 18 ug via RESPIRATORY_TRACT
  Filled 2012-12-12 (×2): qty 5

## 2012-12-12 MED ORDER — MOMETASONE FURO-FORMOTEROL FUM 100-5 MCG/ACT IN AERO
2.0000 | INHALATION_SPRAY | Freq: Two times a day (BID) | RESPIRATORY_TRACT | Status: DC
  Administered 2012-12-12 – 2012-12-18 (×13): 2 via RESPIRATORY_TRACT
  Filled 2012-12-12: qty 8.8

## 2012-12-12 MED ORDER — DILTIAZEM HCL ER COATED BEADS 120 MG PO CP24
120.0000 mg | ORAL_CAPSULE | Freq: Every day | ORAL | Status: DC
  Administered 2012-12-12 – 2012-12-16 (×5): 120 mg via ORAL
  Filled 2012-12-12 (×5): qty 1

## 2012-12-12 MED ORDER — HEPARIN (PORCINE) IN NACL 100-0.45 UNIT/ML-% IJ SOLN
1300.0000 [IU]/h | INTRAMUSCULAR | Status: DC
  Administered 2012-12-12: 1300 [IU]/h via INTRAVENOUS
  Administered 2012-12-12: 1000 [IU]/h via INTRAVENOUS
  Administered 2012-12-13: 1300 [IU]/h via INTRAVENOUS
  Filled 2012-12-12 (×3): qty 250

## 2012-12-12 MED ORDER — PANTOPRAZOLE SODIUM 40 MG PO TBEC
40.0000 mg | DELAYED_RELEASE_TABLET | Freq: Every day | ORAL | Status: DC
  Administered 2012-12-12 – 2012-12-17 (×6): 40 mg via ORAL
  Filled 2012-12-12 (×6): qty 1

## 2012-12-12 MED ORDER — SODIUM CHLORIDE 0.9 % IV SOLN
400.0000 mg | Freq: Once | INTRAVENOUS | Status: AC
  Administered 2012-12-12: 400 mg via INTRAVENOUS
  Filled 2012-12-12: qty 4

## 2012-12-12 MED ORDER — DOCUSATE SODIUM 100 MG PO CAPS
100.0000 mg | ORAL_CAPSULE | Freq: Two times a day (BID) | ORAL | Status: DC
  Administered 2012-12-12 – 2012-12-17 (×12): 100 mg via ORAL
  Filled 2012-12-12 (×18): qty 1

## 2012-12-12 MED ORDER — POLYETHYLENE GLYCOL 3350 17 G PO PACK
17.0000 g | PACK | Freq: Every day | ORAL | Status: DC
  Administered 2012-12-12: 17 g via ORAL
  Filled 2012-12-12 (×4): qty 1

## 2012-12-12 MED ORDER — FERROUS SULFATE 325 (65 FE) MG PO TABS
325.0000 mg | ORAL_TABLET | Freq: Every day | ORAL | Status: DC
  Administered 2012-12-13: 325 mg via ORAL
  Filled 2012-12-12 (×3): qty 1

## 2012-12-12 NOTE — Progress Notes (Signed)
Subjective: 10 Days Post-Op Procedure(s) (LRB): TOTAL HIP ARTHROPLASTY ANTERIOR APPROACH (Right) Patient improving and moving to step down unit. No significant right hip pain. She is having some toe pain bilaterally.  Activity level:  She is currently bed to chair transfer. When appropriate can be full weightbearing on right hip. Diet tolerance:  Pured diet Voiding:  Foley Patient reports pain as 2 on 0-10 scale.    Objective: Vital signs in last 24 hours: Temp:  [98.4 F (36.9 C)-99.7 F (37.6 C)] 98.6 F (37 C) (07/18 0750) Pulse Rate:  [64-109] 109 (07/18 1100) Resp:  [13-29] 29 (07/18 1100) BP: (103-153)/(37-73) 128/51 mmHg (07/18 1100) SpO2:  [92 %-100 %] 99 % (07/18 1116) Weight:  [93.6 kg (206 lb 5.6 oz)] 93.6 kg (206 lb 5.6 oz) (07/18 0500)  Labs:  Recent Labs  12/10/12 0400 12/11/12 0400 12/11/12 2000 12/12/12 0500  HGB 7.8* 7.5* 8.0* 8.3*    Recent Labs  12/11/12 0400 12/11/12 2000 12/12/12 0500  WBC 17.9*  --  16.3*  RBC 2.56*  --  2.86*  HCT 23.0* 24.3* 25.4*  PLT 204  --  221    Recent Labs  12/11/12 0400 12/12/12 0500  NA 150* 145  K 3.3* 3.8  CL 119* 116*  CO2 23 24  BUN 47* 37*  CREATININE 1.06 1.04  GLUCOSE 103* 94  CALCIUM 9.1 9.0    Recent Labs  12/11/12 0400  INR 1.40    Physical Exam:  Compartment soft Right hip exam: Dressing is dry, incision with minimal drainage and less than 1 cm size or less hematoma mid incision. No obvious sign of infection. Fingers somewhat dusky Toes both feet dark/dusky and painful  Assessment/Plan:  10 Days Post-Op Procedure(s) (LRB): TOTAL HIP ARTHROPLASTY ANTERIOR APPROACH (Right) Natalie Kent is able to communicate and carry on a conversation. She is complaining of no significant right hip pain. She can continue bed to chair transfers and weight as tolerated on right leg when appropriate. Dressing change as needed. Dr. Sonda Primes on call this weekend and will see patient. Dressing change as  needed. Moving to the step down today.  Natalie Kent 12/12/2012, 11:37 AM

## 2012-12-12 NOTE — Progress Notes (Signed)
ANTICOAGULATION CONSULT NOTE - Follow Up Consult  Pharmacy Consult for heparin Indication: ischemia of feet and Rt hand, afib   Allergies  Allergen Reactions  . Solarcaine Aloe Extra (Lidocaine Hcl)   . Septra (Bactrim)   . Cephalexin Rash    Patient Measurements: Height: 5\' 6"  (167.6 cm) Weight: 206 lb 5.6 oz (93.6 kg) IBW/kg (Calculated) : 59.3 Heparin Dosing Weight: 80kg  Vital Signs: Temp: 98.6 F (37 C) (07/18 0750) Temp src: Axillary (07/18 0750) BP: 140/62 mmHg (07/18 0750) Pulse Rate: 97 (07/18 0750)  Labs:  Recent Labs  12/10/12 0400 12/11/12 0400 12/11/12 2000 12/12/12 0500  HGB 7.8* 7.5* 8.0* 8.3*  HCT 23.7* 23.0* 24.3* 25.4*  PLT 158 204  --  221  APTT  --  46*  --   --   LABPROT  --  16.8*  --   --   INR  --  1.40  --   --   CREATININE 1.19* 1.06  --  1.04    Estimated Creatinine Clearance: 50.5 ml/min (by C-G formula based on Cr of 1.04).   Medications:  Scheduled:  . chlorhexidine  15 mL Mouth/Throat BID  . [START ON 01/01/2013] cyanocobalamin  1,000 mcg Intramuscular Q30 days  . diltiazem  120 mg Oral Daily  . docusate sodium  100 mg Oral BID  . [START ON 12/13/2012] ferrous sulfate  325 mg Oral Q breakfast  . mometasone-formoterol  2 puff Inhalation BID  . pantoprazole  40 mg Oral Q1200  . polyethylene glycol  17 g Oral Daily  . tiotropium  18 mcg Inhalation Daily    Assessment: 79 yo female with afib and also noted with discolored toes bilaterally. Per Vascular patient with either embolic phenomena vs vasopressor induced distal phalange ischemia. Pharmacy has been asked to dose heparin.  Patient noted with anemia (Hg= 8.3 today; lowest Hg was 6.5 on 7/14) with PRBC transfusions on 7/08, 7/09, 7/14, 7/17).  Last dose of enoxaparin 40mg  was 7/17 at 12:40pm  Goal of Therapy:  Heparin level= 0.3-0.5  Monitor platelets by anticoagulation protocol: Yes   Plan:  -No heparin bolus due to recent transfusions -Begin heparin at 1000 units/hr  (~ 12 units/kg/hr) -Heparin level in 8 hours and daily wth CBC daily  Harland German, Pharm D 12/12/2012 10:14 AM

## 2012-12-12 NOTE — Progress Notes (Signed)
Speech Language Pathology Dysphagia Treatment Patient Details Name: Natalie Kent MRN: [id number] DOB: 05/13/1931 Today's Date: 12/12/2012 Time: 8295-6213 SLP Time Calculation (min): 21 min  Assessment / Plan / Recommendation Clinical Impression  Pt improved today; less lethargic but still weak.  Family feeding pt lunch.  No overt s/s aspiration observed or reported. Pt is afebrile, lung sounds diminished.  Pt taking small bites and sips with minimal cues.  Precautions posted at [facility]. Reviewed with family, and encouraged adherence to them.  Pt appears to be tolerating current diet.  Will continue on this diet for now (dentures currently not available), and monitor for continued tolerance and appropriateness to advance.    Diet Recommendation  Continue with Current Diet: Dysphagia 1 (puree);Thin liquid    SLP Plan Continue with current plan of care   Pertinent Vitals/Pain None reported   Swallowing Goals  SLP Swallowing Goals Patient will consume recommended diet without observed clinical signs of aspiration with: Total assistance Swallow Study Goal #1 - Progress: Progressing toward goal Patient will utilize recommended strategies during swallow to increase swallowing safety with: Total assistance Swallow Study Goal #2 - Progress: Progressing toward goal  General Temperature Spikes Noted: No Respiratory Status: Supplemental O2 delivered via (comment) ([place]@5L ) Behavior/Cognition: Alert;Cooperative;Other (comment) (fatigued, but less lethargic) Oral Cavity - Dentition: Dentures, not available;Edentulous Patient Positioning: Upright in bed  Oral Cavity - Oral Hygiene Does patient have any of the following "at risk" factors?: Oxygen therapy - cannula, mask, simple oxygen devices Brush patient's teeth BID with toothbrush (using toothpaste with fluoride): Yes Patient is AT RISK - Oral Care Protocol followed (see row info): Yes   Dysphagia Treatment Treatment focused on: Skilled  observation of diet tolerance;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 Murvin [facility], CCC-SLP [phone]      [name] 12/12/2012, 12:57 PM

## 2012-12-12 NOTE — Progress Notes (Signed)
PULMONARY  / CRITICAL CARE MEDICINE  Name: Natalie Kent MRN: [id number] DOB: 1931/05/13    ADMISSION DATE:  December 22, 2012 CONSULTATION DATE:  12/04/2012  REFERRING MD : Marcene Corning  CHIEF COMPLAINT:  Rt hip pain  BRIEF PATIENT DESCRIPTION:  79 yo female with arthritis and Rt hip pain.  Had Rt total hip arthroplasty.  Developed altered mental status, abdominal discomfort and hypotension POD#1.  PCCM consulted 7/10 and assumed care.  SIGNIFICANT EVENTS: 7/08 Rt total hip arthroplasty 7/09 Change mental status, acute renal failure 7/10 VDRF 7/15 Extubated 7/16 Still no bowel movement, wheezing on exam 7/17 VVS consult for lower extremity digital ischemia  STUDIES:  7/09 CT head >> no acute findings.  Prior Lt MCA infarct and  left MCA M1 segment stent. 7/09 Abd u/s >> no acute findings 7/09 CT abd/pelvis >> stool filled colon, anterior abdominal wall hernia 7/10 Echo >> mild LVH, EF 65 to 70%, grade 1 diastolic dysfx, mild LA dilation 7/15 ABD xray >> relatively large stool burden 7/17 ABI's  LINES / TUBES: 7/10 Lt IJ CVL >>  7/10 ETT >>7/15  7/10 Rt radial aline >> out  CULTURES: 7/09 Urine >> negative 7/09 Blood >> no growth 7/09 Sputum >> Yeast  ANTIBIOTICS: Vancomycin 7/08 >> 7/18 Imipenem 7/09 >> 7/18  SUBJECTIVE:  Decreased pain in feet.  Denies abd pain.  VITAL SIGNS: Temp:  [98.4 F (36.9 C)-99.7 F (37.6 C)] 98.6 F (37 C) (07/18 0750) Pulse Rate:  [64-101] 97 (07/18 0750) Resp:  [13-25] 18 (07/18 0750) BP: (103-144)/(37-73) 140/62 mmHg (07/18 0750) SpO2:  [100 %] 100 % (07/18 0750) Weight:  [206 lb 5.6 oz (93.6 kg)] 206 lb 5.6 oz (93.6 kg) (07/18 0500) 5 liters [place]  HEMODYNAMICS: CVP:  [13 mmHg] 13 mmHg  INTAKE / OUTPUT: Intake/Output     07/17 0701 - 07/18 0700 07/18 0701 - 07/19 0700   P.O. 50 240   I.V. (mL/kg) 1075 (11.5) 41.7 (0.4)   Blood 350    IV Piggyback 800    Total Intake(mL/kg) 2275 (24.3) 281.7 (3)   Urine (mL/kg/hr) 800 (0.4)  100 (0.4)   Total Output 800 100   Net +1475 +181.7          PHYSICAL EXAMINATION: General: Elderly ill appear female in no distress Neuro: Awake, follows commands HEENT: No sinus tenderness Cardiovascular: irregular Lungs: no wheeze Abdomen: soft, non tender Musculoskeletal:  1+ edema Skin: b/l toes dusky, Rt fingers dusky  LABS: CBC Recent Labs     12/10/12  0400  12/11/12  0400  12/11/12  2000  12/12/12  0500  WBC  16.7*  17.9*   --   16.3*  HGB  7.8*  7.5*  8.0*  8.3*  HCT  23.7*  23.0*  24.3*  25.4*  PLT  158  204   --   221   Coag's Recent Labs     12/11/12  0400  APTT  46*  INR  1.40   BMET Recent Labs     12/10/12  0400  12/11/12  0400  12/12/12  0500  NA  151*  150*  145  K  3.4*  3.3*  3.8  CL  119*  119*  116*  CO2  22  23  24   BUN  57*  47*  37*  CREATININE  1.19*  1.06  1.04  GLUCOSE  86  103*  94   Electrolytes Recent Labs     12/10/12  0400  12/11/12  0400  12/12/12  0500  CALCIUM  8.9  9.1  9.0  MG   --   2.1   --    Liver Enzymes No results found for this basename: AST, ALT, ALKPHOS, BILITOT, ALBUMIN,  in the last 72 hours  Glucose Recent Labs     12/10/12  1935 12/11/12  12/11/12  0344  12/11/12  0837  12/11/12  1238  12/11/12  1737  GLUCAP  89  73  94  87  71  79   Imaging Dg Chest Port 1 View  12/11/2012   *RADIOLOGY REPORT*  Clinical Data: Evaluate infiltrates  PORTABLE CHEST - 1 VIEW  Comparison: 12/10/2012  Findings: Cardiomediastinal silhouette is stable.  Stable NG tube position.  Stable right IJ central line position.  Elevation of the left hemidiaphragm again noted.  Persistent left basilar small atelectasis or infiltrate.  Mild right base atelectasis medially. No pulmonary edema.  Atherosclerotic calcifications of thoracic aorta again noted.  IMPRESSION: Stable NG tube position.  Stable right IJ central line position. Elevation of the left hemidiaphragm again noted.  Persistent left basilar small atelectasis or  infiltrate.  Mild right base atelectasis medially. No pulmonary edema   Original Report Authenticated By: Natasha Mead, M.D.    ASSESSMENT / PLAN:  PULMONARY A: Acute respiratory failure 2nd to HAP - PCXR on 7/16 shows stable bilateral lower lobe infiltrates vs effusions Hx of COPD/asthma. P:   -change to prn xopenex nebs -F/u CXR intermittently -oxygen to keep SpO2 > 92% -bronchial hygiene -resume spiriva -used dulera while in hospital -hold singulair for now  CARDIOVASCULAR A: Septic shock 2nd to pneumonia >> resolved. Hx of HTN. Atrial fibrillation B/l digital ischemia of feet and Rt hand >> ? Related to pressors vs embolic phenomena; appreciate help from VVS. P:  -start heparin gtt per pharmacy -had Echo on 7/10 >> defer to VVS whether another Echo needed -will need CT chest/abd/pelvis to assess vascular system when more stable -resume cardizem at 120 mg daily and increase as tolerated -IV lopressor prn for HR > 130 -hold hyzaar for now  RENAL A: Acute kidney injury >> resolved. Hypokalemia >> improved. Non gap metabolic acidosis >> resolved. P:   -monitor renal function, electrolytes, urine outpt  GASTROINTESTINAL A: Constipation >> improved 7/17 with SMOG enema. Dysphagia. Abdominal wall hernia >> CT findings d/w radiology >> likely benign findings. Hx of GERD. P:   -D1 diet >> f/u with speech therapy to advance diet -protonix for SUP >> may be able to d/c soon -add colace, miralax  HEMATOLOGIC A: Anemia of chronic illness/iron deficiency -  PRBC transfusion on 7/08, 7/09, 7/14. 7/17. Thrombocytopenia - resolved P:  -add ferrous sulfate -f/u CBC -transfuse for Hb < 7  INFECTIOUS A: HAP >> resolved. P:   -d/c Abx 7/18 and monitor clinically  ENDOCRINE A: Mild hyperglycemia >> no hx of DM. P:   -monitor blood sugar on BMET  NEUROLOGIC A: Acute encephalopathy - 2/2 to sepsis, hypoxia, and pain meds >> improved 7/18. Hx of CVA, depression,  fibromyalgia, anxiety. Post-op pain control. Pain from lower extremity ischemia. P:   -avoid benzo's -prn dilaudid -hold effexor for now  ORTHOPEDIC A: Arthritis s/p Rt hip arthroplasty. P: -post-op care per ortho -PT/OT  Updated family at bedside.  Transfer to SDU.  Transfer to [facility] 7/19 and PCCM sign off.  Coralyn Helling, MD [facility] Pulmonary/Critical Care 12/12/2012, 9:43 AM Pager:  [phone] After 3pm call: [phone]

## 2012-12-12 NOTE — Progress Notes (Signed)
ANTICOAGULATION CONSULT NOTE - Follow Up Consult  Pharmacy Consult for heparin Indication: ischemia of feet and Rt hand, afib   Allergies  Allergen Reactions  . Solarcaine Aloe Extra (Lidocaine Hcl)   . Septra (Bactrim)   . Cephalexin Rash    Patient Measurements: Height: 5\' 6"  (167.6 cm) Weight: 206 lb 5.6 oz (93.6 kg) IBW/kg (Calculated) : 59.3 Heparin Dosing Weight: 80kg  Vital Signs: Temp: 100.1 F (37.8 C) (07/18 2043) Temp src: Oral (07/18 2043) BP: 104/38 mmHg (07/18 2043) Pulse Rate: 100 (07/18 2043)  Labs:  Recent Labs  12/10/12 0400 12/11/12 0400 12/11/12 2000 12/12/12 0500 12/12/12 2053  HGB 7.8* 7.5* 8.0* 8.3*  --   HCT 23.7* 23.0* 24.3* 25.4*  --   PLT 158 204  --  221  --   APTT  --  46*  --   --   --   LABPROT  --  16.8*  --   --   --   INR  --  1.40  --   --   --   HEPARINUNFRC  --   --   --   --  <0.10*  CREATININE 1.19* 1.06  --  1.04  --     Estimated Creatinine Clearance: 50.5 ml/min (by C-G formula based on Cr of 1.04).   Assessment: 79 yo female with afib and also noted with discolored toes bilaterally. Per Vascular patient with either embolic phenomena vs vasopressor induced distal phalange ischemia.  Patient noted with anemia (Hg= 8.3 today; lowest Hg was 6.5 on 7/14) with PRBC transfusions on 7/08, 7/09, 7/14, 7/17).  Currently receiving heparin at 1000 units/hr with a resultant heparin level of <0.1.  Per discussion with RN, no problems with infusion or other complications noted.  Goal of Therapy:  Heparin level= 0.3-0.5  Monitor platelets by anticoagulation protocol: Yes   Plan:  - Increase heparin to 1300 units/hr -Heparin level in 8 hours and daily wth CBC daily  Camree Wigington L. Illene Bolus, PharmD, BCPS Clinical Pharmacist Pager: [phone] Pharmacy: [phone] 12/12/2012 10:39 PM

## 2012-12-12 NOTE — Clinical Social Work Placement (Signed)
Clinical Social Work Department  CLINICAL SOCIAL WORK PLACEMENT NOTE  12/12/2012 Patient:Natalie Kent Account Number: [id number]   Admit date: 04/30/2012  Clinical Social Worker: Sabino Niemann LCSWA Date/time: 05/04/2012 11:30 AM  Clinical Social Work is seeking post-discharge placement for this patient at the following level of care: SKILLED NURSING (*CSW will update this form in Epic as items are completed)  7/18/2014Patient/family provided with [facility] Department of Clinical Social Work's list of facilities offering this level of care within the geographic area requested by the patient (or if unable, by the patient's family).  12/12/2012 Patient/family informed of their freedom to choose among providers that offer the needed level of care, that participate in Medicare, Medicaid or managed care program needed by the patient, have an available bed and are willing to accept the patient.  12/12/2012 Patient/family informed of [facility]' ownership interest in [facility], as well as of the fact that they are under no obligation to receive care at this facility.  PASARR submitted to EDS on 12/12/2012 PASARR number received from EDS on 12/12/2012 FL2 transmitted to all facilities in geographic area requested by pt/family on 12/12/2012  FL2 transmitted to all facilities within larger geographic area on  Patient informed that his/her managed care company has contracts with or will negotiate with certain facilities, including the following:  Patient/family informed of bed offers received: Patient chooses bed at  Physician recommends and patient chooses bed at  Patient to be transferred to on  Patient to be transferred to facility by  The following physician request were entered in Epic:  Additional Comments:

## 2012-12-12 NOTE — Progress Notes (Signed)
eLink Physician-Brief Progress Note Patient Name: Natalie Kent DOB: Jan 30, 1931 MRN: [id number]  Date of Service  12/12/2012   HPI/Events of Note   Febrile already on ABX  eICU Interventions  IV caldolor   Intervention Category Intermediate Interventions: Infection - evaluation and management  [name] 12/12/2012, 8:57 PM

## 2012-12-12 NOTE — Clinical Social Work Psychosocial (Signed)
Clinical Social Work Department  BRIEF PSYCHOSOCIAL ASSESSMENT  Patient: Natalie Kent  Account [id number]  Admit date: 12/02/12 Clinical Social Worker Sabino Niemann, MSW Date/Time: 12/12/2012 4:00 PM Referred by: Physician Date Referred: 12/11/12 Referred for   SNF Placement   Other Referral:  Interview type: Patient and patient's daughters  Other interview type: PSYCHOSOCIAL DATA  Living Status:Family Admitted from facility:  Level of care:  Primary support name: Natalie Kent Primary support relationship to patient: Daughter Degree of support available:  Strong and vested  CURRENT CONCERNS  Current Concerns   Post-Acute Placement   Other Concerns:  SOCIAL WORK ASSESSMENT / PLAN  CSW met with pt and patient's 2 daughters. Patient has 4 children re: PT recommendation for SNF.   Pt lives with family  CSW explained placement process and answered questions.   Pt's family reports [facility] and [facility] as her preference    CSW completed FL2 and initiated SNF search.     Assessment/plan status: Information/Referral to [facility]  Other assessment/ plan:  Information/referral to community resources:  SNF     PATIENT'S/FAMILY'S RESPONSE TO PLAN OF CARE:  The family is hesitant about SNF placement at this time but they are agreeable to having the patient's information faxed out.  Pt's family did verbalize appreciation for CSW assist. CSW will continue to follow.  Sabino Niemann, MSW [phone]

## 2012-12-12 NOTE — Progress Notes (Signed)
Physical Therapy Treatment Patient Details Name: Natalie Kent MRN: [id number] DOB: 1931-02-28 Today's Date: 12/12/2012 Time: 0865-7846 PT Time Calculation (min): 30 min  PT Assessment / Plan / Recommendation  PT Comments   Pt more alert this session and verbalizing with therapist.  Pt did tolerate sitting EOB for ~ 5 minutes with increase SOB.  Pt continues to be limited due to overall fatigue. Vascular now involved to further assess bilateral toes and hands for questionable circulation impairment.     Follow Up Recommendations  CIR     Equipment Recommendations  None recommended by PT    Recommendations for Other Services    Frequency Min 5X/week   Progress towards PT Goals Progress towards PT goals: Progressing toward goals (slowly )  Plan Frequency needs to be updated;Other (comment) (updated goals)    Precautions / Restrictions Precautions Precautions: Fall;Anterior Hip Restrictions Weight Bearing Restrictions: Yes RLE Weight Bearing: Weight bearing as tolerated   Pertinent Vitals/Pain C/o rigt hip pain but does not rate    Mobility  Bed Mobility Bed Mobility: Supine to Sit;Sitting - Scoot to [place] of Bed;Sit to Supine Rolling Right: 1: +2 Total assist Rolling Right: Patient Percentage: 20% Supine to Sit: 1: +2 Total assist;HOB elevated Supine to Sit: Patient Percentage: 30% Sitting - Scoot to Edge of Bed: 1: +2 Total assist Sitting - Scoot to Edge of Bed: Patient Percentage: 10% Sit to Supine: 1: +2 Total assist;HOB flat Sit to Supine: Patient Percentage: 30% Details for Bed Mobility Assistance: assist for trunk elevation and LE management, step by step verbal cues required to sequence and use upper body to assist. Transfers Transfers: Not assessed Ambulation/Gait Ambulation/Gait Assistance: Not tested (comment)    Exercises     PT Diagnosis:    PT Problem List:   PT Treatment Interventions:     PT Goals (current goals can now be found in the care plan  section) Acute Rehab PT Goals Patient Stated Goal: Pt did not set PT Goal Formulation: With patient/family Time For Goal Achievement: 12/26/12 Potential to Achieve Goals: Fair  Visit Information  Last PT Received On: 12/12/12 Assistance Needed: +2 History of Present Illness: s/p elective R THA anterior approach, pt now with septic shock, UTI, acute kidney injury    Subjective Data  Subjective: "Can I have some water?" Patient Stated Goal: Pt did not set   Cognition  Cognition Arousal/Alertness: Awake/alert Behavior During Therapy: Flat affect Overall Cognitive Status: Difficult to assess    Balance  Static Sitting Balance Static Sitting - Balance Support: Feet supported;Bilateral upper extremity supported Static Sitting - Level of Assistance: 2: Max assist;4: Min assist Static Sitting - Comment/# of Minutes: Initially max (A) to maintain upright posture due to posterior lean; with tactile cues and VCs for hand placement pt able to progress to intermittent min (A); to tolerated sitting EOB for ~ 5 minutes prior to return to supine position.   End of Session PT - End of Session Equipment Utilized During Treatment: Gait belt;Oxygen Activity Tolerance: Patient limited by pain;Patient limited by fatigue Patient left: in bed;with call bell/phone within reach;with nursing/sitter in room (Placed in chair position) Nurse Communication: Mobility status   GP     Jedaiah Rathbun 12/12/2012, 12:06 PM  Jake Shark, PT DPT [phone]

## 2012-12-13 ENCOUNTER — Inpatient Hospital Stay (HOSPITAL_COMMUNITY): Payer: Medicare Other

## 2012-12-13 DIAGNOSIS — J9600 Acute respiratory failure, unspecified whether with hypoxia or hypercapnia: Secondary | ICD-10-CM

## 2012-12-13 DIAGNOSIS — A419 Sepsis, unspecified organism: Secondary | ICD-10-CM

## 2012-12-13 DIAGNOSIS — R6520 Severe sepsis without septic shock: Secondary | ICD-10-CM

## 2012-12-13 DIAGNOSIS — N179 Acute kidney failure, unspecified: Secondary | ICD-10-CM

## 2012-12-13 DIAGNOSIS — G9340 Encephalopathy, unspecified: Secondary | ICD-10-CM

## 2012-12-13 DIAGNOSIS — E875 Hyperkalemia: Secondary | ICD-10-CM

## 2012-12-13 LAB — BASIC METABOLIC PANEL
Calcium: 8.3 mg/dL — ABNORMAL LOW (ref 8.4–10.5)
GFR calc Af Amer: 48 mL/min — ABNORMAL LOW (ref >90)
GFR calc non Af Amer: 41 mL/min — ABNORMAL LOW (ref >90)
Glucose, Bld: 75 mg/dL (ref 70–99)
Potassium: 3.5 mEq/L (ref 3.5–5.1)
Sodium: 140 mEq/L (ref 135–145)

## 2012-12-13 LAB — POCT I-STAT 3, ART BLOOD GAS (G3+)
O2 Saturation: 96 %
TCO2: 19 mmol/L (ref 0–100)
pCO2 arterial: 31.5 mmHg — ABNORMAL LOW (ref 35.0–45.0)
pH, Arterial: 7.373 (ref 7.350–7.450)
pO2, Arterial: 84 mmHg (ref 80.0–100.0)

## 2012-12-13 LAB — CBC
HCT: 29 % — ABNORMAL LOW (ref 36.0–46.0)
Hemoglobin: 7.1 g/dL — ABNORMAL LOW (ref 12.0–15.0)
Hemoglobin: 9.8 g/dL — ABNORMAL LOW (ref 12.0–15.0)
MCV: 87.6 fL (ref 78.0–100.0)
Platelets: 236 10*3/uL (ref 150–400)
RBC: 2.42 MIL/uL — ABNORMAL LOW (ref 3.87–5.11)
RBC: 3.31 MIL/uL — ABNORMAL LOW (ref 3.87–5.11)
WBC: 10.5 10*3/uL (ref 4.0–10.5)
WBC: 12.4 10*3/uL — ABNORMAL HIGH (ref 4.0–10.5)

## 2012-12-13 LAB — HEPARIN LEVEL (UNFRACTIONATED): Heparin Unfractionated: 0.13 IU/mL — ABNORMAL LOW (ref 0.30–0.70)

## 2012-12-13 LAB — PREPARE RBC (CROSSMATCH)

## 2012-12-13 MED ORDER — IMIPENEM-CILASTATIN 500 MG IV SOLR
500.0000 mg | Freq: Three times a day (TID) | INTRAVENOUS | Status: DC
  Administered 2012-12-13 – 2012-12-14 (×3): 500 mg via INTRAVENOUS
  Filled 2012-12-13 (×5): qty 500

## 2012-12-13 MED ORDER — SODIUM CHLORIDE 0.9 % IV BOLUS (SEPSIS)
500.0000 mL | Freq: Once | INTRAVENOUS | Status: AC
  Administered 2012-12-13: 500 mL via INTRAVENOUS

## 2012-12-13 MED ORDER — ACETAMINOPHEN 650 MG RE SUPP
650.0000 mg | Freq: Four times a day (QID) | RECTAL | Status: DC | PRN
  Administered 2012-12-13 – 2012-12-14 (×3): 650 mg via RECTAL
  Filled 2012-12-13 (×3): qty 1

## 2012-12-13 MED ORDER — LORAZEPAM 2 MG/ML IJ SOLN
1.0000 mg | Freq: Once | INTRAMUSCULAR | Status: AC
  Administered 2012-12-13: 1 mg via INTRAVENOUS
  Filled 2012-12-13: qty 1

## 2012-12-13 MED ORDER — FUROSEMIDE 10 MG/ML IJ SOLN
INTRAMUSCULAR | Status: AC
  Filled 2012-12-13: qty 4

## 2012-12-13 MED ORDER — FUROSEMIDE 10 MG/ML IJ SOLN
40.0000 mg | Freq: Once | INTRAMUSCULAR | Status: AC
  Administered 2012-12-13: 40 mg via INTRAVENOUS

## 2012-12-13 MED ORDER — VANCOMYCIN HCL 10 G IV SOLR
1250.0000 mg | INTRAVENOUS | Status: DC
  Administered 2012-12-13 – 2012-12-15 (×3): 1250 mg via INTRAVENOUS
  Filled 2012-12-13 (×4): qty 1250

## 2012-12-13 MED ORDER — CHLORHEXIDINE GLUCONATE CLOTH 2 % EX PADS
6.0000 | MEDICATED_PAD | Freq: Every day | CUTANEOUS | Status: DC
  Administered 2012-12-13 – 2012-12-15 (×3): 6 via TOPICAL

## 2012-12-13 MED ORDER — ALBUMIN HUMAN 5 % IV SOLN
INTRAVENOUS | Status: AC
  Filled 2012-12-13: qty 250

## 2012-12-13 MED ORDER — ALBUMIN HUMAN 25 % IV SOLN
12.5000 g | Freq: Once | INTRAVENOUS | Status: AC
  Administered 2012-12-13: 12.5 g via INTRAVENOUS
  Filled 2012-12-13: qty 50

## 2012-12-13 MED ORDER — HEPARIN (PORCINE) IN NACL 100-0.45 UNIT/ML-% IJ SOLN
2100.0000 [IU]/h | INTRAMUSCULAR | Status: DC
  Administered 2012-12-13: 1450 [IU]/h via INTRAVENOUS
  Administered 2012-12-13: 1650 [IU]/h via INTRAVENOUS
  Administered 2012-12-14: 1800 [IU]/h via INTRAVENOUS
  Administered 2012-12-15: 2000 [IU]/h via INTRAVENOUS
  Filled 2012-12-13 (×5): qty 250

## 2012-12-13 MED ORDER — MUPIROCIN 2 % EX OINT
1.0000 "application " | TOPICAL_OINTMENT | Freq: Two times a day (BID) | CUTANEOUS | Status: DC
  Administered 2012-12-13 – 2012-12-15 (×7): 1 via NASAL
  Filled 2012-12-13: qty 22

## 2012-12-13 NOTE — Progress Notes (Signed)
eLink Physician-Brief Progress Note Patient Name: LANDRY LOOKINGBILL DOB: Dec 31, 1930 MRN: [id number]  Date of Service  12/13/2012   HPI/Events of Note  Ongoing hypotension in the setting of ischemia secondary to pressors.  Current BP of 81/23 (42) with little to no response to volume.  Current Hgb 7.1 down from 8.3   eICU Interventions  Plan: Transfuse 1  Unit of pRBC Post-txf CBC   Intervention Category Intermediate Interventions: Hypotension - evaluation and management;Bleeding - evaluation and treatment with blood products  DETERDING,ELIZABETH 12/13/2012, 5:06 AM

## 2012-12-13 NOTE — Progress Notes (Signed)
12/13/12  Pharmacy-Heparin 2010  Heparin Level < 0.1  A/P:  Pt on heparin for ischemia of feet and R-hand, and AFib.  Heparin level is sub-therapeutic on current rate.  Per d/w RN, there have been no issues with the IV or infusion today.  1.  Increase heparin to 1650 units/hr 2.  Check heparin level 6 hours  Marisue Humble, PharmD Clinical Pharmacist [facility]

## 2012-12-13 NOTE — Progress Notes (Signed)
Weeping(serous) noted to posterior arms(bil.)

## 2012-12-13 NOTE — Progress Notes (Signed)
[facility] PROGRESS NOTE  Natalie Kent UEA:[id number] DOB: November 25, 1930 DOA: 12/04/2012 PCP: Londell Moh, MD  Assessment/Plan: Principal Problem:   DJD (degenerative joint disease) of hip Active Problems:   Acute encephalopathy   Acute on chronic renal failure   Hyperkalemia   Hypotension, unspecified   Abdominal pain, unspecified site   Severe sepsis(23.36)   79 year old female with complicated hospitalization, underwent right total hip replacement followed by acute on chronic renal failure, pneumonia septic shock, atrial fibrillation and GI bleeding.On 12/11/12, pt had complaints of burning in both feet and bluish color to toes. Pt was weaned off vasopressors on 12/09/12. She was found to have a possible embolic event to the toes because of her new onset atrial fibrillation.pt was on Phenylephrine from 12/07/12 and weaned 12/10/12-possibly side effect from vasoconstriction as her discoloration started the day after neo was started Patient was on Plavix prior to admission, vascular surgery recommended CTA chest/abd/pelvis with B leg runoff.    SIGNIFICANT EVENTS:  7/08 Rt total hip arthroplasty  7/09 Change mental status, acute renal failure  7/10 VDRF  7/15 Extubated  7/16 Still no bowel movement, wheezing on exam  7/17 VVS consult for lower extremity digital ischemia  STUDIES:  7/09 CT head >> no acute findings. Prior Lt MCA infarct and left MCA M1 segment stent.  7/09 Abd u/s >> no acute findings  7/09 CT abd/pelvis >> stool filled colon, anterior abdominal wall hernia  7/10 Echo >> mild LVH, EF 65 to 70%, grade 1 diastolic dysfx, mild LA dilation  7/15 ABD xray >> relatively large stool burden  LINES / TUBES:  7/10 Lt IJ CVL >>  7/10 ETT >>7/15  7/10 Rt radial aline >> out  CULTURES:  7/09 Urine >> negative  7/09 Blood >> no growth  7/09 Sputum >> Yeast  ANTIBIOTICS:  Vancomycin 7/08 >>  Imipenem 7/09 >>      A: Acute respiratory failure 2nd to HAP  - PCXR on 7/16 shows stable bilateral lower lobe infiltrates vs effusions  Hx of COPD/asthma.  P:  -change to prn xopenex nebs  -F/u CXR intermittently  -oxygen to keep SpO2 > 92%  -bronchial hygiene  -resume spiriva  -used dulera while in hospital  -hold singulair for now    A: Septic shock 2nd to pneumonia >> resolved.  Hx of HTN.  Atrial fibrillation  B/l digital ischemia of feet and Rt hand >> ? Related to pressors vs embolic phenomena; appreciate help from VVS.  P:  Started heparin gtt per pharmacy  -had Echo on 7/10 >> defer to VVS whether another Echo needed  -will need CT chest/abd/pelvis to assess vascular system when more stable  -resume cardizem at 120 mg daily and increase as tolerated  -IV lopressor prn for HR > 130  -hold hyzaar for now     A: Acute kidney injury >> resolved.  Hypokalemia >> improved.  Non gap metabolic acidosis >> resolved.  P:  -monitor renal function, electrolytes, urine outpt     A: Constipation >> improved 7/17 with SMOG enema.  Dysphagia.  Abdominal wall hernia >> CT findings d/w radiology >> likely benign findings.  Hx of GERD.  P:  -D1 diet >> f/u with speech therapy to advance diet  -protonix for SUP >> may be able to d/c soon  -add colace, miralax     A: Anemia of chronic illness/iron deficiency - PRBC transfusion on 7/08, 7/09, 7/14. 7/17, 7/18 Thrombocytopenia - resolved  P:  -add ferrous sulfate  -  f/u CBC  -transfuse for Hb < 7  Repeat CBC pending Was on aspirin and Plavix at home this has been placed on hold because of anemia Status post 5 units of packed red blood cells   A: HAP >> resolved.  P:  -d/c Abx 7/18 and monitor clinically     A: Mild hyperglycemia >> no hx of DM.  P:  -monitor blood sugar on BMET     A: Acute encephalopathy - 2/2 to sepsis, hypoxia, and pain meds >> improved 7/18.  Hx of CVA, depression, fibromyalgia, anxiety.  Post-op pain control.  Pain from lower extremity ischemia.  P:   -avoid benzo's  -prn dilaudid  -hold effexor for now     A: Arthritis s/p Rt hip arthroplasty.  P:  -post-op care per ortho  -PT/OT  Updated family at bedside.  Transfer to SDU. Transfer to [facility] 7/19        HPI/Subjective: Decreased pain in feet. Denies abd pain Hypotensive last night and received 1 unit of packed red blood cells and one bolus of 500 cc  Objective: Filed Vitals:   12/13/12 0645 12/13/12 0700 12/13/12 0715 12/13/12 0800  BP: 120/39 105/27 106/47 128/38  Pulse: 83 81 81 81  Temp:      TempSrc:      Resp: 20 24 22 24   Height:      Weight:      SpO2: 100% 100% 100% 100%    Intake/Output Summary (Last 24 hours) at 12/13/12 0834 Last data filed at 12/13/12 0700  Gross per 24 hour  Intake 2159.21 ml  Output    365 ml  Net 1794.21 ml    Exam:  General: Elderly ill appear female in no distress  Neuro: Awake, follows commands  HEENT: No sinus tenderness  Cardiovascular: irregular  Lungs: no wheeze  Abdomen: soft, non tender  Musculoskeletal: 1+ edema  Skin: b/l toes dusky, Rt fingers dusky    Data Reviewed: Basic Metabolic Panel:  Recent Labs Lab 12/07/12 0500  12/08/12 0140  12/09/12 0400 12/10/12 0400 12/11/12 0400 12/12/12 0500 12/13/12 0430  NA 146*  < > 146*  < > 148* 151* 150* 145 140  K 3.2*  < > 3.3*  < > 3.1* 3.4* 3.3* 3.8 3.5  CL 116*  < > 115*  < > 115* 119* 119* 116* 113*  CO2 19  < > 18*  < > 22 22 23 24 21   GLUCOSE 91  < > 135*  < > 112* 86 103* 94 75  BUN 72*  < > 75*  < > 66* 57* 47* 37* 33*  CREATININE 1.86*  < > 1.79*  < > 1.49* 1.19* 1.06 1.04 1.21*  CALCIUM 8.4  < > 8.2*  < > 8.6 8.9 9.1 9.0 8.3*  MG 2.8*  --  2.5  --  2.3  --  2.1  --   --   PHOS 6.7*  --  6.2*  --  5.2*  --   --   --   --   < > = values in this interval not displayed.  Liver Function Tests:  Recent Labs Lab 12/07/12 1730 12/09/12 0500  AST 93* 60*  ALT 90* 43*  ALKPHOS 112 76  BILITOT 0.7 0.8  PROT 4.4* 4.4*  ALBUMIN 1.5* 1.5*     Recent Labs Lab 12/09/12 0500  LIPASE 84*  AMYLASE 48   No results found for this basename: AMMONIA,  in the last 168 hours  CBC:  Recent Labs Lab 12/09/12 0400 12/10/12 0400 12/11/12 0400 12/11/12 2000 12/12/12 0500 12/13/12 0430  WBC 12.6* 16.7* 17.9*  --  16.3* 10.5  HGB 7.8* 7.8* 7.5* 8.0* 8.3* 7.1*  HCT 23.3* 23.7* 23.0* 24.3* 25.4* 21.5*  MCV 86.0 87.8 89.8  --  88.8 88.8  PLT 143* 158 204  --  221 236    Cardiac Enzymes:  Recent Labs Lab 12/06/12 0918 12/07/12 0500 12/08/12 0140 12/09/12 0500  CKTOTAL 422* 233* 95 69  TROPONINI <0.30  --   --   --    BNP (last 3 results)  Recent Labs  12/05/12 0918 12/08/12 0140  PROBNP 7718.0* 3055.0*     CBG:  Recent Labs Lab 12/11/12 12/11/12 0344 12/11/12 0837 12/11/12 1238 12/11/12 1737  GLUCAP 73 94 87 71 79    Recent Results (from the past 240 hour(s))  URINE CULTURE     Status: None   Collection Time    12/03/12 11:16 AM      Result Value Range Status   Specimen Description URINE, RANDOM   Final   Special Requests NONE   Final   Culture  Setup Time 12/03/2012 17:13   Final   Colony Count NO GROWTH   Final   Culture NO GROWTH   Final   Report Status 12/04/2012 FINAL   Final  CULTURE, BLOOD (ROUTINE X 2)     Status: None   Collection Time    12/03/12  4:40 PM      Result Value Range Status   Specimen Description BLOOD LEFT ARM   Final   Special Requests BOTTLES DRAWN AEROBIC ONLY 5CC   Final   Culture  Setup Time 12/04/2012 00:10   Final   Culture NO GROWTH 5 DAYS   Final   Report Status 12/10/2012 FINAL   Final  CULTURE, BLOOD (ROUTINE X 2)     Status: None   Collection Time    12/03/12  4:45 PM      Result Value Range Status   Specimen Description BLOOD LEFT HAND   Final   Special Requests BOTTLES DRAWN AEROBIC ONLY 5CC   Final   Culture  Setup Time 12/04/2012 00:11   Final   Culture NO GROWTH 5 DAYS   Final   Report Status 12/10/2012 FINAL   Final  CULTURE, BAL-QUANTITATIVE      Status: None   Collection Time    12/05/12  3:06 PM      Result Value Range Status   Specimen Description BRONCHIAL ALVEOLAR LAVAGE   Final   Special Requests NONE   Final   Gram Stain     Final   Value: NO WBC SEEN     NO SQUAMOUS EPITHELIAL CELLS SEEN     NO ORGANISMS SEEN   Colony Count 25,000 COLONIES/ML   Final   Culture YEAST   Final   Report Status 12/07/2012 FINAL   Final     Studies: Ct Abdomen Pelvis Wo Contrast  12/03/2012   *RADIOLOGY REPORT*  Clinical Data: Generalized abdominal pain.  CT ABDOMEN AND PELVIS WITHOUT CONTRAST  Technique:  Multidetector CT imaging of the abdomen and pelvis was performed following the standard protocol without intravenous contrast.  Comparison: 07/04/2012  Findings: Minimal bilateral pleural effusions with basilar atelectasis or consolidation in both lung bases.  Coronary artery calcification and aortic valve calcification.  Mild cardiac enlargement.  Large esophageal hiatal hernia.  Surgical absence of the gallbladder.  The unenhanced appearance of  the liver, spleen, pancreas, adrenal glands, and retroperitoneal lymph nodes is unremarkable.  The inferior vena cava is flattened which may suggest hypovolemia.  Calcified and tortuous aorta without aneurysm.  The kidneys are atrophic without hydronephrosis. Calcification is present in the right renal hilum which could represent a nonobstructing stone or vascular calcifications.  The stomach is decompressed.  Small bowel are not abnormally distended. Diffusely stool filled colon with mild distension suggesting constipation.  No free air or free fluid in the abdomen.  There is an inferior midline anterior abdominal wall hernia containing fat. Herniated fat demonstrates stranding which may represent fat necrosis due to incarceration of the fat.  Pelvis:  Visualization of the low pelvis is limited due to streak artifact arising from right hip prosthesis.  The bladder appears to be decompressed with Foley catheter.   Uterus is surgically absent. No abnormal adnexal masses.  The appendix appears to be surgically absent.  No free or loculated pelvic fluid collections.  Stool filled rectosigmoid colon without evidence of diverticulitis. There is infiltration in the subcutaneous fat over the right hip with subcutaneous gas collections and skin clips. Mild hematoma in the right iliac this muscle.  This likely represents recent hip surgery change.  No definite abscess.  Scoliosis and degenerative change in the lumbar spine.  No destructive bone lesions are appreciated.  IMPRESSION: Recent postoperative changes in the right hip and surrounding soft tissues.  Small bilateral pleural effusions with atelectasis or consolidation in the lung bases.  Stool filled colon suggesting constipation.  Anterior abdominal wall hernia containing fat with fat stranding suggesting possible fat necrosis.   Original Report Authenticated By: Burman Nieves, M.D.   Dg Chest 2 View  11/25/2012   *RADIOLOGY REPORT*  Clinical Data: Preoperative study for right hip surgery, history of hypertension  CHEST - 2 VIEW  Comparison: 09/14/2012  Findings: The heart size and vascular pattern are normal.  There is a moderate hiatal hernia.  There is eventration of the diaphragm on the right.  These findings are stable.  There is mild bibasilar scarring.  IMPRESSION: No acute findings   Original Report Authenticated By: Esperanza Heir, M.D.   Dg Hip Operative Right  12/06/2012   *RADIOLOGY REPORT*  Clinical Data: Post anterior approach right total hip replacement  DG OPERATIVE RIGHT HIP  Comparison: None.  Findings:  Two anterior projection images of the lower pelvis and right hip are provided for review.  Images demonstrate the sequela of right sided total hip replacement.  Alignment appears near anatomic on the provided anterior projection.  No definite fracture.  No definite radiopaque foreign body.  IMPRESSION: Post right total hip replacement without definite  evidence of complication.   Original Report Authenticated By: Tacey Ruiz, MD   Ct Head Wo Contrast  12/03/2012   *RADIOLOGY REPORT*  Clinical Data: 79 year old female with acute encephalopathy.  CT HEAD WITHOUT CONTRAST  Technique:  Contiguous axial images were obtained from the base of the skull through the vertex without contrast.  Comparison: 06/30/2012 and earlier.  Findings: Visualized paranasal sinuses and mastoids are clear.  No acute osseous abnormality identified.  Intermittent mild motion artifact.  No acute orbit or scalp soft tissue findings.  Calcified atherosclerosis at the skull base.  There is a left MCA M1 segment stent re-identified.  Stable cerebral volume.  Chronic left MCA encephalomalacia.  Stable ventricles with ex vacuo change to the left lateral ventricle. No midline shift, mass effect, or evidence of mass lesion.  No evidence  of cortically based acute infarction identified.  No acute intracranial hemorrhage identified.  No suspicious intracranial vascular hyperdensity.  IMPRESSION: No acute intracranial abnormality. Stable noncontrast CT appearance of the brain with chronic left MCA infarct and left MCA M1 segment stent.   Original Report Authenticated By: Erskine Speed, M.D.   US Abdomen Complete  12/03/2012   *RADIOLOGY REPORT*  Clinical Data:  Elevated LFTs, acute on chronic renal failure  ABDOMINAL ULTRASOUND COMPLETE  Comparison:  CT abdomen/pelvis 07/04/2012  Findings:  Gallbladder:  The gallbladder is surgically absent.  Common Bile Duct:  Within normal limits in caliber.  Liver: Heterogeneous hepatic parenchyma without definite echogenicity.  The liver appears slightly hypoechoic prominent echogenicity of the portal triads.  IVC:  Poorly seen secondary to obscuring bowel gas.  Pancreas:  Not well seen secondary to obscuring bowel gas.  Spleen:  Sonographically unremarkable.  Normal in size 6.6 cm.  Right kidney:  Mild renal cortical atrophy.  No hydronephrosis. Extrarenal pelvis  noted incidentally. Kidney measures 9.3 cm in length.  Left kidney:  Mild renal cortical atrophy.  No hydronephrosis.  The kidney measures 12.3 cm in length.  Abdominal Aorta:  Not well seen secondary to obscuring bowel gas.  IMPRESSION:  1.  No acute abnormality.  No hydronephrosis. 2.  Slightly hypoechoic hepatic parenchyma with increased echogenicity of the portal triads.  While nonspecific, similar findings can be seen with acute hepatitis. 3.  Mild bilateral renal cortical atrophy. 4.  Surgical changes of cholecystectomy.   Original Report Authenticated By: Malachy Moan, M.D.   Dg Chest Port 1 View  12/11/2012   *RADIOLOGY REPORT*  Clinical Data: Evaluate infiltrates  PORTABLE CHEST - 1 VIEW  Comparison: 12/10/2012  Findings: Cardiomediastinal silhouette is stable.  Stable NG tube position.  Stable right IJ central line position.  Elevation of the left hemidiaphragm again noted.  Persistent left basilar small atelectasis or infiltrate.  Mild right base atelectasis medially. No pulmonary edema.  Atherosclerotic calcifications of thoracic aorta again noted.  IMPRESSION: Stable NG tube position.  Stable right IJ central line position. Elevation of the left hemidiaphragm again noted.  Persistent left basilar small atelectasis or infiltrate.  Mild right base atelectasis medially. No pulmonary edema   Original Report Authenticated By: Natasha Mead, M.D.   Dg Chest Port 1 View  12/10/2012   *RADIOLOGY REPORT*  Clinical Data: Pneumonia.  PORTABLE CHEST - 1 VIEW  Comparison: 12/09/2012  Findings: The patient has been extubated.  Nasogastric tube and central line remain in stable position.  Lungs show some improvement in aeration with residual atelectasis/infiltrates remaining in both bases.  No edema or large pleural effusions are identified.  IMPRESSION: Improved aeration bilaterally.   Original Report Authenticated By: Irish Lack, M.D.   Dg Chest Port 1 View  12/09/2012   *RADIOLOGY REPORT*  Clinical  Data: Evaluate endotracheal tube placement.  PORTABLE CHEST - 1 VIEW  Comparison: Chest x-ray 12/07/2012.  Findings: An endotracheal tube is in place with tip 1.0 cm above the carina. There is a right-sided internal jugular central venous catheter with tip terminating in the superior cavoatrial junction. A nasogastric tube is seen extending into the stomach, however, the tip of the nasogastric tube extends below the lower margin of the image.  Lung volumes are low.  There are bibasilar opacities which may reflect areas of atelectasis and/or consolidation, with superimposed small to moderate bilateral pleural effusions (left greater than right), which appear increased.  Mild pulmonary venous congestion, without frank pulmonary edema.  Heart size appears borderline enlarged, likely accentuated by low lung volumes and patient rotation to the left which distorts the mediastinal contours.  Atherosclerosis of the thoracic aorta.  IMPRESSION: 1.  Support apparatus, as above.  Endotracheal tube is low lying, only 1 cm above the carina, and could be withdrawn approximately 3 cm for more optimal placement. 2.  Low lung volumes with persistent bibasilar atelectasis and/or consolidation, and increasing small to moderate bilateral pleural effusions (left greater than right). 3.  Atherosclerosis.   Original Report Authenticated By: Trudie Reed, M.D.   Dg Chest Port 1 View  12/07/2012   *RADIOLOGY REPORT*  Clinical Data: Check endotracheal tube.  PORTABLE CHEST - 1 VIEW  Comparison: 12/06/2012  Findings: Appliances are unchanged in position.  Shallow inspiration.  Cardiac enlargement without pulmonary vascular congestion.  Consolidation in the left base with probable small left pleural effusion.  No significant change.  No pneumothorax.  IMPRESSION: Appliances appear stable in position.  Cardiac enlargement. Consolidation and probable small effusion in the left base.   Original Report Authenticated By: Burman Nieves, M.D.    Dg Chest Port 1 View  12/06/2012   *RADIOLOGY REPORT*  Clinical Data: Central line placement.  PORTABLE CHEST - 1 VIEW  Comparison: Earlier today.  Findings: Interval right jugular catheter with its tip in the inferior aspect of the superior vena cava near the cavoatrial junction.  No pneumothorax.  Endotracheal tube in satisfactory position.  Nasogastric tube extending into the stomach.  Mildly improved aeration at the right lung base and in the left perihilar region.  Stable dense left basilar airspace consolidation.  IMPRESSION:  1.  Right jugular catheter tip in the inferior aspect of the superior vena cava without pneumothorax. 2.  Decreased bilateral atelectasis. 3.  Stable dense left basilar atelectasis or pneumonia.   Original Report Authenticated By: Beckie Salts, M.D.   Dg Chest Port 1 View  12/06/2012   *RADIOLOGY REPORT*  Clinical Data: Central line placement.  Acute respiratory failure on ventilator.  PORTABLE CHEST - 1 VIEW  Comparison: 12/05/2012  Findings: Had a left internal jugular center venous catheter is seen with the tip now pulled back into the left internal jugular vein overlying the medial clavicle.  Endotracheal tube and nasogastric tube remain in appropriate position.  Left greater than right lower lung infiltrates are again seen with a small to moderate left pleural effusion.  These findings are stable.  Heart size is unchanged.  IMPRESSION:  1.  Left jugular center venous catheter tip has been pulled back, now overlying the inferior left internal jugular vein. 2.  No significant change in the right greater than left lower lung infiltrates and left pleural effusion.   Original Report Authenticated By: Myles Rosenthal, M.D.   Dg Chest Port 1 View  12/05/2012   *RADIOLOGY REPORT*  Clinical Data: Post thoracentesis  PORTABLE CHEST - 1 VIEW  Comparison: Portable chest x-ray of 12/05/2012.  Findings:  The tip of the endotracheal tube is approximally 2.3 cm above the carina.  There does  appear to have been reduction in the volume of the right pleural effusion.  The side of the thoracentesis was not given.  Left pleural effusion remains with bibasilar atelectasis present.  No pneumothorax is seen.  A left central venous line tip overlies the mid SVC.  IMPRESSION:  1.  Apparent reduction in volume of the right pleural effusion.  No pneumothorax post thoracentesis. 2.  Left pleural effusion and bibasilar atelectasis remain. 3.  Tip of  endotracheal tube 2.3 cm above the carina.   Original Report Authenticated By: Dwyane Dee, M.D.   Portable Chest Xray In Am  12/05/2012   *RADIOLOGY REPORT*  Clinical Data: Intubation, confirm endotracheal tube placement  PORTABLE CHEST - 1 VIEW  Comparison: Portable exam 0647 hours compared to 12/04/2012  Findings: Tip of endotracheal tube projects 2.9 cm above carina. Severely rotated to the left. Nasogastric tube coiled the stomach. Tip of left jugular central venous catheter projects over SVC. Upper normal heart size. Atherosclerotic calcification aorta. Persistent right basilar consolidation. Atelectasis and effusion in left lower lobe, cannot exclude left lower lobe infiltrate as well.  IMPRESSION: Persistent right basilar consolidation. Pleural effusion and atelectasis at left lung base, cannot exclude left lower lobe infiltrate.   Original Report Authenticated By: Ulyses Southward, M.D.   Dg Chest Port 1 View  12/04/2012   *RADIOLOGY REPORT*  Clinical Data: Intubation.  PORTABLE CHEST - 1 VIEW  Comparison: 12/04/2012  Findings: Endotracheal tube is approximately 3 cm above the carina. Left central line is unchanged.  NG tube coils in the stomach.  Cardiomegaly with bilateral lower lobe airspace opacities and effusions, increasing on the right since prior study.  IMPRESSION: Endotracheal tube 3 cm above the carina.  Bilateral effusions with bibasilar atelectasis or infiltrates.  Cardiomegaly.   Original Report Authenticated By: Charlett Nose, M.D.   Dg Chest  Portable 1 View  12/04/2012   *RADIOLOGY REPORT*  Clinical Data: Central line placement, asthma.  PORTABLE CHEST - 1 VIEW  Comparison: 12/04/2012  Findings: Left central line is in place with the tip in the SVC. No pneumothorax.  Mild cardiomegaly.  Improving aeration in the right base.  Continued left lower lobe atelectasis or infiltrate with small left effusion.  No acute bony abnormality.  IMPRESSION: Left central line tip in the SVC.  No pneumothorax.  Continued left basilar atelectasis or infiltrate with left effusion.   Original Report Authenticated By: Charlett Nose, M.D.   Dg Chest Port 1 View  12/04/2012   *RADIOLOGY REPORT*  Clinical Data: Short of breath, sepsis  PORTABLE CHEST - 1 VIEW  Comparison: Prior chest x-ray 12/03/2012  Findings: Stable cardiomegaly.  Probable hiatal hernia. Atherosclerotic calcifications in the transverse aorta.  Slightly lower inspiratory volumes with increased bibasilar opacities. Lower volumes results in crowding of the pulmonary vasculature. There may be slightly increased vascular congestion but no overt edema.  No pneumothorax.  IMPRESSION: Lower inspiratory volumes with increased bibasilar opacities and slightly increased pulmonary vascular congestion.   Original Report Authenticated By: Malachy Moan, M.D.   Dg Chest Port 1 View  12/03/2012   *RADIOLOGY REPORT*  Clinical Data: Postop hip replacement, hypoxemia  PORTABLE CHEST - 1 VIEW  Comparison: Chest x-ray of 11/25/2012  Findings: There is bibasilar atelectasis, with more opacity at the left lung base also suggesting the presence of left pleural effusion.  A two-view chest x-ray would be helpful.  Cardiomegaly is stable.  No acute bony abnormality is seen with degenerative change noted in both shoulders.  IMPRESSION: Diminished aeration with bibasilar atelectasis.  Suspect left pleural effusion as well.  Consider two-view chest x-ray.   Original Report Authenticated By: Dwyane Dee, M.D.   Dg Abd Portable  1v  12/09/2012   *RADIOLOGY REPORT*  Clinical Data: NG tube placement, respiratory failure  PORTABLE ABDOMEN - 1 VIEW  Comparison: Plain films 12/09/2012, CT 12/03/2012  Findings: The NG tube loops above the diaphragms likely within the large hiatal hernia seen on comparison CT.  IMPRESSION: NG tube looped back within the hiatal hernia above the hemidiaphragms.   Original Report Authenticated By: Genevive Bi, M.D.   Dg Abd Portable 1v  12/09/2012   *RADIOLOGY REPORT*  Clinical Data: Abdominal pain and tenderness.  PORTABLE ABDOMEN - 1 VIEW  Comparison: Pelvic radiograph performed 12/07/2012, and CT of the abdomen and pelvis performed 12/03/2012  Findings: The visualized colon appears largely filled with stool, raising concern for some degree of constipation.  There is no evidence of small bowel dilatation to suggest obstruction.  No free intra-abdominal air is identified, though evaluation for free air suboptimal on a single supine view.   Clips are noted within the right upper quadrant, reflecting prior cholecystectomy.  The patient's enteric tube is noted ending overlying the body of the stomach.  No acute osseous abnormalities are seen.  The patient's right hip arthroplasty is grossly unremarkable in appearance, though incompletely imaged.  IMPRESSION: Relatively large stool burden noted, raising concern for some degree of constipation.  No evidence for bowel obstruction; no free intra-abdominal air seen.   Original Report Authenticated By: Tonia Ghent, M.D.   Dg Abd Portable 1v  12/07/2012   *RADIOLOGY REPORT*  Clinical Data: Abdominal pain  PORTABLE ABDOMEN - 1 VIEW  Comparison: 12/04/2012  Findings: NG tube in the stomach.  Negative for bowel obstruction. Moderate constipation with stool throughout the colon.  This is similar to the prior study.  Clips in the gallbladder fossa.  Right hip replacement in satisfactory position.  Levoscoliosis.  IMPRESSION: Constipation without bowel obstruction.    Original Report Authenticated By: Janeece Riggers, M.D.   Dg Abd Portable 1v  12/04/2012   *RADIOLOGY REPORT*  Clinical Data: OG tube placement.  Distended abdomen.  PORTABLE ABDOMEN - 1 VIEW  Comparison: CT 12/03/2012  Findings: NG tube coils in the stomach.  The tip is in the body of the stomach.  Large stool burden throughout the colon.  IMPRESSION: NG tube coils in the stomach.   Original Report Authenticated By: Charlett Nose, M.D.   Dg Abd Portable 1v  12/03/2012   *RADIOLOGY REPORT*  Clinical Data: Right hip arthroplasty.  Abdominal pain  PORTABLE ABDOMEN - 1 VIEW  Comparison: 07/04/2012  Findings: Large amount of stool is present throughout the colon compatible with constipation.  No bowel obstruction.  Lumbar levoscoliosis.  Right hip replacement in satisfactory position.  Gallbladder clips in the right upper quadrant.  IMPRESSION: Moderate to severe constipation.  Negative for bowel obstruction.   Original Report Authenticated By: Janeece Riggers, M.D.    Scheduled Meds: . albumin human      . chlorhexidine  15 mL Mouth/Throat BID  . Chlorhexidine Gluconate Cloth  6 each Topical Q0600  . [START ON 01/01/2013] cyanocobalamin  1,000 mcg Intramuscular Q30 days  . diltiazem  120 mg Oral Daily  . docusate sodium  100 mg Oral BID  . ferrous sulfate  325 mg Oral Q breakfast  . mometasone-formoterol  2 puff Inhalation BID  . mupirocin ointment  1 application Nasal BID  . pantoprazole  40 mg Oral Q1200  . polyethylene glycol  17 g Oral Daily  . tiotropium  18 mcg Inhalation Daily   Continuous Infusions: . dextrose 20 mL/hr at 12/12/12 2213  . heparin 1,300 Units/hr (12/13/12 1610)    Principal Problem:   DJD (degenerative joint disease) of hip Active Problems:   Acute encephalopathy   Acute on chronic renal failure   Hyperkalemia   Hypotension, unspecified   Abdominal pain, unspecified  site   Severe sepsis(995.92)    Time spent: 40 minutes   [facility]  [facility] Pager  [phone]. If 8PM-8AM, please contact night-coverage at [email or web address], password [facility] 12/13/2012, 8:34 AM  LOS: 11 days

## 2012-12-13 NOTE — Progress Notes (Addendum)
ANTIBIOTIC CONSULT NOTE - FOLLOW UP  Pharmacy Consult for vancomycin, imipenem Indication: foot infection  Allergies  Allergen Reactions  . Solarcaine Aloe Extra (Lidocaine Hcl)   . Septra (Bactrim)   . Cephalexin Rash    Patient Measurements: Height: 5\' 6"  (167.6 cm) Weight: 210 lb 5.1 oz (95.4 kg) IBW/kg (Calculated) : 59.3  Vital Signs: Temp: 100.3 F (37.9 C) (07/19 1215) Temp src: Axillary (07/19 1215) BP: 146/71 mmHg (07/19 1200) Pulse Rate: 103 (07/19 1215) Intake/Output from previous day: 07/18 0701 - 07/19 0700 In: 2473.9 [P.O.:830; I.V.:989.9; IV Piggyback:654] Out: 465 [Urine:465] Intake/Output from this shift: Total I/O In: 582.5 [P.O.:60; I.V.:172.5; Blood:350] Out: 793 [Urine:793]  Labs:  Recent Labs  12/11/12 0400 12/11/12 2000 12/12/12 0500 12/13/12 0430  WBC 17.9*  --  16.3* 10.5  HGB 7.5* 8.0* 8.3* 7.1*  PLT 204  --  221 236  CREATININE 1.06  --  1.04 1.21*   Estimated Creatinine Clearance: 43.9 ml/min (by C-G formula based on Cr of 1.21). No results found for this basename: VANCOTROUGH, VANCOPEAK, VANCORANDOM, GENTTROUGH, GENTPEAK, GENTRANDOM, TOBRATROUGH, TOBRAPEAK, TOBRARND, AMIKACINPEAK, AMIKACINTROU, AMIKACIN,  in the last 72 hours    Assessment: 79 yo female recently on antibiotics for PNA to resume coverage due to necrotic areas of the feet  . WBC= 10.5, tmax= 100.3, SCr= 1.21 and CrCl ~40. Patient was recently on vancomycin 750mg  IV q12h.    Vanc 7/9 (received pre/post-op doses 7/8) >> 7/18 Primaxin 7/9 >> 7/18 7/14 VT = 26.8 (Scr 1.35) on 1g q24 (now on 750/12h which is more Vanco but Scr has improved (Scr 1.04)  7/9 BCx - neg 7/9 UCx - neg 7/11 BAL- 25K yeast 7/19 blood x2  Goal of Therapy:  Vancomycin trough level 15-20 mcg/ml  Plan:  -With SCr bump will give vancomycin 1250mg  IV q24hr -Imipenem 500mg  IV q8h -Will follow cultures and renal function  Harland German, Pharm D 12/13/2012 1:19 PM

## 2012-12-13 NOTE — Progress Notes (Signed)
Notified e link about crying out in pain about her feet "burning."

## 2012-12-13 NOTE — Progress Notes (Signed)
ANTICOAGULATION CONSULT NOTE - Follow Up Consult  Pharmacy Consult for heparin Indication: ischemia of feet and Rt hand, afib   Allergies  Allergen Reactions  . Solarcaine Aloe Extra (Lidocaine Hcl)   . Septra (Bactrim)   . Cephalexin Rash    Patient Measurements: Height: 5\' 6"  (167.6 cm) Weight: 210 lb 5.1 oz (95.4 kg) IBW/kg (Calculated) : 59.3 Heparin Dosing Weight: 80kg  Vital Signs: Temp: 98.9 F (37.2 C) (07/19 0844) Temp src: Axillary (07/19 0844) BP: 145/48 mmHg (07/19 0900) Pulse Rate: 95 (07/19 0900)  Labs:  Recent Labs  12/11/12 0400 12/11/12 2000 12/12/12 0500 12/12/12 2053 12/13/12 0430 12/13/12 0800  HGB 7.5* 8.0* 8.3*  --  7.1*  --   HCT 23.0* 24.3* 25.4*  --  21.5*  --   PLT 204  --  221  --  236  --   APTT 46*  --   --   --   --   --   LABPROT 16.8*  --   --   --   --   --   INR 1.40  --   --   --   --   --   HEPARINUNFRC  --   --   --  <0.10*  --  0.13*  CREATININE 1.06  --  1.04  --  1.21*  --     Estimated Creatinine Clearance: 43.9 ml/min (by C-G formula based on Cr of 1.21).   Assessment: 79 yo female with afib and also noted with discolored toes bilaterally. Per Vascular patient with either embolic phenomena vs vasopressor induced distal phalange ischemia.  Patient noted with anemia (Hg= 7.1 today; lowest Hg was 6.5 on 7/14) with PRBC transfusions on 7/08, 7/09, 7/14, 7/17).  Currently receiving heparin at 1300 units/hr with a resultant heparin level of 0.13    Goal of Therapy:  Heparin level= 0.3-0.5  Monitor platelets by anticoagulation protocol: Yes   Plan:  - Increase heparin to 1450 units/hr -Heparin level in 8 hours and daily wth CBC daily -Will watch post transfusion CBC ordered per MD today  Harland German, Pharm D 12/13/2012 9:46 AM

## 2012-12-13 NOTE — Progress Notes (Signed)
VASCULAR PROGRESS NOTE  SUBJECTIVE: No specific complaints.   PHYSICAL EXAM: Filed Vitals:   12/13/12 1200 12/13/12 1215 12/13/12 1230 12/13/12 1300  BP: 146/71  153/77 157/67  Pulse: 49 103 100   Temp: 100.3 F (37.9 C) 100.3 F (37.9 C)    TempSrc: Axillary Axillary    Resp: 24 22 22    Height:      Weight:      SpO2: 99% 97% 99%    Dry gangrene of toes bilat.  Good doppler flow bilat.  Mild cellulitis. No drainage.   LABS: Lab Results  Component Value Date   WBC 10.5 12/13/2012   HGB 7.1* 12/13/2012   HCT 21.5* 12/13/2012   MCV 88.8 12/13/2012   PLT 236 12/13/2012   Lab Results  Component Value Date   CREATININE 1.21* 12/13/2012   Lab Results  Component Value Date   INR 1.40 12/11/2012   CBG (last 3)   Recent Labs  12/11/12 0837 12/11/12 1238 12/11/12 1737  GLUCAP 87 71 79    Principal Problem:   DJD (degenerative joint disease) of hip Active Problems:   Acute encephalopathy   Acute on chronic renal failure   Hyperkalemia   Hypotension, unspecified   Abdominal pain, unspecified site   Severe sepsis(995.92)   ASSESSMENT AND PLAN:  * 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  Currently, the toes are not a source of sepsis. Keep toes clean and dry. We will follow. I explained to the family that the longer we can ride this out the more tissue that we will be able to save. Will need CTA as noted by Dr. Imogene Burn when she is stable medically.   Cari Caraway Beeper: [phone] 12/13/2012

## 2012-12-13 NOTE — Progress Notes (Signed)
eLink Physician-Brief Progress Note Patient Name: Natalie Kent DOB: 1930/09/12 MRN: [id number]  Date of Service  12/13/2012   HPI/Events of Note  Patient with continued issues of burning pain of feet not relieved with dilaudid 1 mg or NSAID IV.  Also with hypotension with MAP of 53 100/28.  CVP earlier in day had been 8.  Camera check shows patient to be constantly calling out.   eICU Interventions  Plan: 500 cc bolus of NS for hypotension. 1mg  ativan IV times one   Intervention Category Intermediate Interventions: Hypotension - evaluation and management;Pain - evaluation and management  DETERDING,ELIZABETH 12/13/2012, 1:44 AM

## 2012-12-13 NOTE — Progress Notes (Signed)
Dr  Susie Cassette informed of Pt's family's desire to speak w/Dr re the plan for the toes on bil feet .Daughter voiced concern re plan of care ..." maybe we should just move her to [facility]." Meds , VS ,recent assessment , etc reviewed w/Dr . Orders received.

## 2012-12-14 LAB — COMPREHENSIVE METABOLIC PANEL
ALT: 10 U/L (ref 0–35)
Albumin: 1.3 g/dL — ABNORMAL LOW (ref 3.5–5.2)
Alkaline Phosphatase: 89 U/L (ref 39–117)
Calcium: 8.5 mg/dL (ref 8.4–10.5)
Potassium: 3.3 mEq/L — ABNORMAL LOW (ref 3.5–5.1)
Sodium: 140 mEq/L (ref 135–145)
Total Protein: 4.5 g/dL — ABNORMAL LOW (ref 6.0–8.3)

## 2012-12-14 LAB — CBC
MCHC: 33.8 g/dL (ref 30.0–36.0)
Platelets: 281 10*3/uL (ref 150–400)
RDW: 17 % — ABNORMAL HIGH (ref 11.5–15.5)

## 2012-12-14 LAB — HEPARIN LEVEL (UNFRACTIONATED)
Heparin Unfractionated: 0.21 IU/mL — ABNORMAL LOW (ref 0.30–0.70)
Heparin Unfractionated: 0.1 IU/mL — ABNORMAL LOW (ref 0.30–0.70)

## 2012-12-14 LAB — TYPE AND SCREEN
ABO/RH(D): A NEG
Antibody Screen: NEGATIVE

## 2012-12-14 LAB — CLOSTRIDIUM DIFFICILE BY PCR: Toxigenic C. Difficile by PCR: NEGATIVE

## 2012-12-14 MED ORDER — FUROSEMIDE 10 MG/ML IJ SOLN
INTRAMUSCULAR | Status: AC
  Filled 2012-12-14: qty 4

## 2012-12-14 MED ORDER — POTASSIUM CHLORIDE CRYS ER 20 MEQ PO TBCR
20.0000 meq | EXTENDED_RELEASE_TABLET | Freq: Two times a day (BID) | ORAL | Status: AC
  Administered 2012-12-14 (×2): 20 meq via ORAL
  Filled 2012-12-14 (×2): qty 1

## 2012-12-14 MED ORDER — DEXTROSE 5 % IV SOLN
2.0000 g | INTRAVENOUS | Status: DC
  Administered 2012-12-14 – 2012-12-15 (×2): 2 g via INTRAVENOUS
  Filled 2012-12-14 (×3): qty 2

## 2012-12-14 MED ORDER — FUROSEMIDE 10 MG/ML IJ SOLN
20.0000 mg | Freq: Once | INTRAMUSCULAR | Status: AC
  Administered 2012-12-14: 20 mg via INTRAVENOUS

## 2012-12-14 NOTE — Progress Notes (Signed)
ANTICOAGULATION CONSULT NOTE - Follow Up Consult  Pharmacy Consult for heparin Indication: atrial fibrillation and ischemia of feet/hand  Labs:  Recent Labs  12/11/12 0400  12/12/12 0500  12/13/12 0430 12/13/12 0800 12/13/12 1450 12/13/12 1835 12/14/12 0300  HGB 7.5*  < > 8.3*  --  7.1*  --  9.8*  --  9.0*  HCT 23.0*  < > 25.4*  --  21.5*  --  29.0*  --  26.6*  PLT 204  --  221  --  236  --  282  --  281  APTT 46*  --   --   --   --   --   --   --   --   LABPROT 16.8*  --   --   --   --   --   --   --   --   INR 1.40  --   --   --   --   --   --   --   --   HEPARINUNFRC  --   --   --   < >  --  0.13*  --  <0.10* 0.21*  CREATININE 1.06  --  1.04  --  1.21*  --   --   --   --   < > = values in this interval not displayed.   Assessment: 79yo female remains subtherapeutic on heparin after rate increase though approaching goal.  Goal of Therapy:  Heparin level 0.3-0.5 units/ml   Plan:  Will increase heparin gtt by 1-2 units/kg/hr to 1800 units/hr and check level in 8hr.  Vernard Gambles, PharmD, BCPS  12/14/2012,3:52 AM

## 2012-12-14 NOTE — Progress Notes (Signed)
VASCULAR PROGRESS NOTE  SUBJECTIVE: No complaints.  PHYSICAL EXAM: Filed Vitals:   12/14/12 0401 12/14/12 0500 12/14/12 0600 12/14/12 0700  BP: 144/50  115/33   Pulse: 105 92 74 101  Temp: 100.4 F (38 C)     TempSrc: Core (Comment)     Resp: 20 20 27 24   Height:      Weight: 209 lb 14.1 oz (95.2 kg)     SpO2: 100% 99% 99% 98%   Dry gangrene of toes of both feet is unchanged.  LABS: Lab Results  Component Value Date   WBC 9.3 12/14/2012   HGB 9.0* 12/14/2012   HCT 26.6* 12/14/2012   MCV 88.1 12/14/2012   PLT 281 12/14/2012   Lab Results  Component Value Date   CREATININE 1.24* 12/14/2012   CBG (last 3)   Recent Labs  12/11/12 0837 12/11/12 1238 12/11/12 1737  GLUCAP 87 71 79    Principal Problem:   DJD (degenerative joint disease) of hip Active Problems:   Acute encephalopathy   Acute on chronic renal failure   Hyperkalemia   Hypotension, unspecified   Abdominal pain, unspecified site   Severe sepsis(995.92)   ASSESSMENT AND PLAN:  * Will need CT angiogram of the chest, abdomen, pelvis, and bilateral lower extremity runoff. Dr. Imogene Burn to F/U tomorrow.   * Toes stable. Will continue to allow them to demarcate to maximize preservation of tissue.  Cari Caraway Beeper: [phone] 12/14/2012

## 2012-12-14 NOTE — Progress Notes (Signed)
Subjective:  12 Days Post-Op Procedure: R TOTAL HIP ARTHROPLASTY ANTERIOR APPROACH  No significant right hip pain. Toe pain bilaterally. Back Pain Activity level: She is currently bed to chair transfer. When appropriate can be full weightbearing on right hip.  Patient reports pain as 3 on 0-10 scale. Has been Given Tylenol suppository with some benefit to pain levels   Objective:  BP 113/62  Pulse 91  Temp(Src) 100.4 F (38 C) (Core (Comment))  Resp 24  Ht 5\' 6"  (1.676 m)  Wt 95.2 kg (209 lb 14.1 oz)  BMI 33.89 kg/m2  SpO2 95%  Labs:  CBC    Component Value Date/Time   WBC 9.3 12/14/2012 0300   RBC 3.02* 12/14/2012 0300   HGB 9.0* 12/14/2012 0300   HCT 26.6* 12/14/2012 0300   PLT 281 12/14/2012 0300   MCV 88.1 12/14/2012 0300   MCH 29.8 12/14/2012 0300   MCHC 33.8 12/14/2012 0300   RDW 17.0* 12/14/2012 0300   LYMPHSABS 1.2  2035   MONOABS 1.6* 11/30/2012 2035   EOSABS 0.0 12/07/2012 2035   BASOSABS 0.0 12/14/2012 2035    BUN 31, Cr 1.24, K 3.3  Physical Exam:  Compartment soft  Right hip exam: Dressing with serous drainage, less than yesterday. Erythema diminished compared to yesterday, edema much improved from yesterday in LLE, mild serous drainage from portals, no pustulent drainage, ROM of R Hip with minimal pain. Dressing changed.  Toes both feet dark/dusky and painful, demarcation occuring  Assessment/Plan:  12 Days Pos-Op  Right TOTAL HIP ARTHROPLASTY ANTERIOR APPROACH   Natalie Kent is able to communicate and carry on a conversation. She is complaining of no significant right hip pain. She complains of low back pain and chills, She is anxious about her toes and the plan of care for them.   -Continue bed to chair transfers and weight as tolerated on right leg when appropriate.   -Dressing change as needed.    -Alert ortho team if drainage becomes pustulant   -Vascular to perform CT angiogram of chest, Abd, pelvis, BIL LE's   -Toes stable, awaiting demarcation and CT  angiogram results  -Medicine team following, started Vancomycin and primaxin for concerns of sepsis  -Family has concerns regarding plan of care for toes and sepsis, appears to be confused and unsure of plan of    action, encouraged them to reach out to medical and vascular team as they have not spoken to them today  -Incision and visit discussed with Natalie Colander PA-C, he will follow tomorrow for additional evaluation of R hip    incision and check to continue to monitor for infection.

## 2012-12-14 NOTE — Progress Notes (Signed)
SLP Cancellation Note  Patient Details Name: Natalie Kent MRN: [id number] DOB: 05/09/1931   Cancelled treatment:        Pt currently lethargic, and is inappropriate to attempt advanced consistencies.  RN reports pt appears to be tolerating puree diet with thin liquids, without overt s/s aspiration.  Lung sounds diminished, CXR indicates bibasilar atelectasis.  ST to continue to follow.  Jimmylee Ratterree B. Murvin Natal [facility], CCC-SLP [phone]   [name] 12/14/2012, 5:06 PM

## 2012-12-14 NOTE — Progress Notes (Signed)
ANTIBIOTIC CONSULT NOTE - FOLLOW UP  Pharmacy Consult for vancomycin, cefepime Indication: foot infection  Allergies  Allergen Reactions  . Solarcaine Aloe Extra (Lidocaine Hcl)   . Septra (Bactrim)   . Cephalexin Rash    Patient Measurements: Height: 5\' 6"  (167.6 cm) Weight: 209 lb 14.1 oz (95.2 kg) IBW/kg (Calculated) : 59.3  Vital Signs: Temp: 100.4 F (38 C) (07/20 0401) Temp src: Core (Comment) (07/20 0401) BP: 113/62 mmHg (07/20 1000) Pulse Rate: 91 (07/20 1000) Intake/Output from previous day: 07/19 0701 - 07/20 0700 In: 2394.3 [P.O.:540; I.V.:954.3; Blood:350; IV Piggyback:550] Out: 2463 [Urine:2463] Intake/Output from this shift: Total I/O In: 150 [P.O.:150] Out: 255 [Urine:255]  Labs:  Recent Labs  12/12/12 0500 12/13/12 0430 12/13/12 1450 12/14/12 0300  WBC 16.3* 10.5 12.4* 9.3  HGB 8.3* 7.1* 9.8* 9.0*  PLT 221 236 282 281  CREATININE 1.04 1.21*  --  1.24*   Estimated Creatinine Clearance: 42.8 ml/min (by C-G formula based on Cr of 1.24). No results found for this basename: VANCOTROUGH, VANCOPEAK, VANCORANDOM, GENTTROUGH, GENTPEAK, GENTRANDOM, TOBRATROUGH, TOBRAPEAK, TOBRARND, AMIKACINPEAK, AMIKACINTROU, AMIKACIN,  in the last 72 hours    Assessment: 79 yo female recently on antibiotics for PNA to resume coverage due to necrotic areas of the feet. Patient was ordered cefepime and vancomycin. Imipenem was ordered on 7/19 in error.   Cefepime 7/20>> Primaxin 7/9 >> 7/18; 7/19>> 7/20 Vanc 7/9 (received pre/post-op doses 7/8) >> 7/18 7/14 VT = 26.8 (Scr 1.35) on 1g q24 (now on 750/12h which is more Vanco but Scr has improved (Scr 1.04)  7/9 BCx - neg 7/9 UCx - neg 7/11 BAL- 25K yeast 7/19 blood x2  Goal of Therapy:  Vancomycin trough level 15-20 mcg/ml  Plan:  -D/c imipenem -Cefepime 2gm IV q24h -Will follow cultures and renal function  Harland German, Pharm D 12/14/2012 11:04 AM

## 2012-12-14 NOTE — Plan of Care (Signed)
Problem: Phase III Progression Outcomes Goal: Pain controlled with appropriate interventions Outcome: Not Progressing Good pain relief with dilaudid , yet family wishes to keep Pt "awake" and refuses pain meds for Pt when they are present.Pt often called out for pain meds while family was in room . Dr aware and has conversed w/family. Goal: Activity advanced as tolerated Outcome: Not Progressing Severe pain to Rt hip and bil feet with mobility ( and occ w/immobility). Goal: Nutritional plan in place Outcome: Not Progressing Poor appetite . Family providing pepsi and gingerale despite Dr's advice to limit po fluid  Intake and to increase nutrition. Goal: Barriers To Progression Addressed/Resolved Poor po intake of proteins and nutritious foods .Refusal of family members to encourage Pt to consume nutritious foods .Refusal of family to bring Pt's favorite foods in pureed form .

## 2012-12-14 NOTE — Progress Notes (Signed)
ANTICOAGULATION CONSULT NOTE - Follow Up Consult  Pharmacy Consult for heparin Indication: atrial fibrillation and ischemia of feet/hand  Labs:  Recent Labs  12/12/12 0500  12/13/12 0430  12/13/12 1450  12/14/12 0300 12/14/12 1245 12/14/12 1940  HGB 8.3*  --  7.1*  --  9.8*  --  9.0*  --   --   HCT 25.4*  --  21.5*  --  29.0*  --  26.6*  --   --   PLT 221  --  236  --  282  --  281  --   --   HEPARINUNFRC  --   < >  --   < >  --   < > 0.21* 0.36 0.10*  CREATININE 1.04  --  1.21*  --   --   --  1.24*  --   --   < > = values in this interval not displayed.   Assessment: 79yo female on heparin on 1800units/hr and heparin level is subtherapeutic after being at goal at this rate earlier today.  HL= 0.10  No issues with line or drip per RN.    Goal of Therapy:  Heparin level 0.3-0.5 units/ml   Plan:  Increase Heparin to 2000 units/hr. Check heparin level in 8 hours. Daily heparin level/cbc.  Wendie Simmer, PharmD, BCPS Clinical Pharmacist  Pager: [phone]

## 2012-12-14 NOTE — Progress Notes (Signed)
[facility] PROGRESS NOTE  Natalie Kent:[id number] DOB: April 22, 1931 DOA: December 07, 2012 PCP: Londell Moh, MD  Assessment/Plan: Principal Problem:   DJD (degenerative joint disease) of hip Active Problems:   Acute encephalopathy   Acute on chronic renal failure   Hyperkalemia   Hypotension, unspecified   Abdominal pain, unspecified site   Severe sepsis(71.40)     79 year old female with complicated hospitalization, underwent right total hip replacement followed by acute on chronic renal failure, pneumonia septic shock, atrial fibrillation and GI bleeding.On 12/11/12, pt had complaints of burning in both feet and bluish color to toes. Pt was weaned off vasopressors on 12/09/12. She was found to have a possible embolic event to the toes because of her new onset atrial fibrillation.pt was on Phenylephrine from 12/07/12 and weaned 12/10/12-possibly side effect from vasoconstriction as her discoloration started the day after neo was started  Patient was on Plavix prior to admission, vascular surgery recommended CTA chest/abd/pelvis with B leg runoff.  Developed a fever last night, patient started on broad-spectrum antibiotics vancomycin and Primaxin due to concern for sepsis  SIGNIFICANT EVENTS:  7/08 Rt total hip arthroplasty  7/09 Change mental status, acute renal failure  7/10 VDRF  7/15 Extubated  7/16 Still no bowel movement, wheezing on exam  7/17 VVS consult for lower extremity digital ischemia  STUDIES:  7/09 CT head >> no acute findings. Prior Lt MCA infarct and left MCA M1 segment stent.  7/09 Abd u/s >> no acute findings  7/09 CT abd/pelvis >> stool filled colon, anterior abdominal wall hernia  7/10 Echo >> mild LVH, EF 65 to 70%, grade 1 diastolic dysfx, mild LA dilation  7/15 ABD xray >> relatively large stool burden  LINES / TUBES:  7/10 Lt IJ CVL >>  7/10 ETT >>7/15  7/10 Rt radial aline >> out  CULTURES:  7/09 Urine >> negative  7/09 Blood >> no growth   7/09 Sputum >> Yeast  ANTIBIOTICS:  Vancomycin 7/08 >>  Imipenem 7/09 >>    A: Acute respiratory failure 2nd to HAP - PCXR on 7/16 shows stable bilateral lower lobe infiltrates vs effusions  Hx of COPD/asthma.  P:  -change to prn xopenex nebs  -F/u CXR intermittently  -oxygen to keep SpO2 > 92%  -bronchial hygiene  -resume spiriva  -used dulera while in hospital  -hold singulair for now  Repeat chest x-ray shows np PNA    A: Septic shock 2nd to pneumonia >> resolved.  Hx of HTN.  Atrial fibrillation  B/l digital ischemia of feet and Rt hand >> ? Related to pressors vs embolic phenomena; appreciate help from VVS.  P:  Started heparin gtt per pharmacy  -had Echo on 7/10 >> defer to VVS whether another Echo needed  -will need CT chest/abd/pelvis to assess vascular system when more stable  -resume cardizem at 120 mg daily and increase as tolerated  -IV lopressor prn for HR > 130  -hold hyzaar for now  Follow blood culture, now on vancomycin and Primaxin Discussed with family risk of renal failure ,increase mortality from vascular procedure , with extremely high morbidity and mortality risk,  Family agreeable to proceed after discussing with vascular surgery    A: Acute kidney injury >> resolved.  Hypokalemia >> improved.  Non gap metabolic acidosis >> resolved.  P:  -monitor renal function, electrolytes, urine outpt , received one dose of Lasix today because of volume overload after packed red blood cells   A: Constipation >> improved 7/17 with  SMOG enema.  Dysphagia.  Abdominal wall hernia >> CT findings d/w radiology >> likely benign findings.  Hx of GERD.  P:  -D1 diet >> f/u with speech therapy to advance diet  -protonix for SUP >> may be able to d/c soon  Discontinue colace, miralax given diarrhea    A: Anemia of chronic illness/iron deficiency - PRBC transfusion on 7/08, 7/09, 7/14. 7/17, 7/18  Thrombocytopenia - resolved  P:  -add ferrous sulfate  -f/u  CBC  -transfuse for Hb < 7  Repeat CBC pending  Was on aspirin and Plavix at home this has been placed on hold because of anemia  Status post 5 units of packed red blood cells    A: HAP >> resolved.  P:  -d/c Abx 7/18 but resumed on 7/19 because of fever   A: Mild hyperglycemia >> no hx of DM.  P:  -monitor blood sugar on BMET    A: Acute encephalopathy - 2/2 to sepsis, hypoxia, and pain meds >> improved 7/18.  Hx of CVA, depression, fibromyalgia, anxiety.  Post-op pain control.  Pain from lower extremity ischemia.  P:  -avoid benzo's  -prn dilaudid  -hold effexor for now    A: Arthritis s/p Rt hip arthroplasty.  P:  -post-op care per ortho  -PT/OT  Updated family at bedside.  Transfer to SDU. Transfer to [facility] 7/19 , prognosis guarded      HPI/Subjective:  spike a fever yesterday, was started on broad-spectrum antibiotics Denies any cough   Objective: Filed Vitals:   12/14/12 0401 12/14/12 0500 12/14/12 0600 12/14/12 0700  BP: 144/50  115/33   Pulse: 105 92 74 101  Temp: 100.4 F (38 C)     TempSrc: Core (Comment)     Resp: 20 20 27 24   Height:      Weight: 95.2 kg (209 lb 14.1 oz)     SpO2: 100% 99% 99% 98%    Intake/Output Summary (Last 24 hours) at 12/14/12 0841 Last data filed at 12/14/12 0700  Gross per 24 hour  Intake 2361.25 ml  Output   2463 ml  Net -101.75 ml    Exam:  HENT:  Head: Atraumatic.  Nose: Nose normal.  Mouth/Throat: Oropharynx is clear and moist.  Eyes: Conjunctivae are normal. Pupils are equal, round, and reactive to light. No scleral icterus.  Neck: Neck supple. No tracheal deviation present.  Cardiovascular: Normal rate, regular rhythm, normal heart sounds and intact distal pulses.  Pulmonary/Chest: Effort normal and breath sounds normal. No respiratory distress.  Abdominal: Soft. Normal appearance and bowel sounds are normal. She exhibits no distension. There is no tenderness.  Musculoskeletal: She exhibits no edema  and no tenderness.  Neurological: She is alert. No cranial nerve deficit.    Data Reviewed: Basic Metabolic Panel:  Recent Labs Lab 12/08/12 0140  12/09/12 0400 12/10/12 0400 12/11/12 0400 12/12/12 0500 12/13/12 0430 12/14/12 0300  NA 146*  < > 148* 151* 150* 145 140 140  K 3.3*  < > 3.1* 3.4* 3.3* 3.8 3.5 3.3*  CL 115*  < > 115* 119* 119* 116* 113* 110  CO2 18*  < > 22 22 23 24 21 20   GLUCOSE 135*  < > 112* 86 103* 94 75 77  BUN 75*  < > 66* 57* 47* 37* 33* 31*  CREATININE 1.79*  < > 1.49* 1.19* 1.06 1.04 1.21* 1.24*  CALCIUM 8.2*  < > 8.6 8.9 9.1 9.0 8.3* 8.5  MG 2.5  --  2.3  --  2.1  --   --  1.9  PHOS 6.2*  --  5.2*  --   --   --   --   --   < > = values in this interval not displayed.  Liver Function Tests:  Recent Labs Lab 12/07/12 1730 12/09/12 0500 12/14/12 0300  AST 93* 60* 25  ALT 90* 43* 10  ALKPHOS 112 76 89  BILITOT 0.7 0.8 0.8  PROT 4.4* 4.4* 4.5*  ALBUMIN 1.5* 1.5* 1.3*    Recent Labs Lab 12/09/12 0500  LIPASE 84*  AMYLASE 48   No results found for this basename: AMMONIA,  in the last 168 hours  CBC:  Recent Labs Lab 12/11/12 0400 12/11/12 2000 12/12/12 0500 12/13/12 0430 12/13/12 1450 12/14/12 0300  WBC 17.9*  --  16.3* 10.5 12.4* 9.3  HGB 7.5* 8.0* 8.3* 7.1* 9.8* 9.0*  HCT 23.0* 24.3* 25.4* 21.5* 29.0* 26.6*  MCV 89.8  --  88.8 88.8 87.6 88.1  PLT 204  --  221 236 282 281    Cardiac Enzymes:  Recent Labs Lab 12/08/12 0140 12/09/12 0500  CKTOTAL 95 69   BNP (last 3 results)  Recent Labs  12/05/12 0918 12/08/12 0140  PROBNP 7718.0* 3055.0*     CBG:  Recent Labs Lab 12/11/12 12/11/12 0344 12/11/12 0837 12/11/12 1238 12/11/12 1737  GLUCAP 73 94 87 71 79    Recent Results (from the past 240 hour(s))  CULTURE, BAL-QUANTITATIVE     Status: None   Collection Time    12/05/12  3:06 PM      Result Value Range Status   Specimen Description BRONCHIAL ALVEOLAR LAVAGE   Final   Special Requests NONE   Final    Gram Stain     Final   Value: NO WBC SEEN     NO SQUAMOUS EPITHELIAL CELLS SEEN     NO ORGANISMS SEEN   Colony Count 25,000 COLONIES/ML   Final   Culture YEAST   Final   Report Status 12/07/2012 FINAL   Final     Studies: Ct Abdomen Pelvis Wo Contrast  12/03/2012   *RADIOLOGY REPORT*  Clinical Data: Generalized abdominal pain.  CT ABDOMEN AND PELVIS WITHOUT CONTRAST  Technique:  Multidetector CT imaging of the abdomen and pelvis was performed following the standard protocol without intravenous contrast.  Comparison: 07/04/2012  Findings: Minimal bilateral pleural effusions with basilar atelectasis or consolidation in both lung bases.  Coronary artery calcification and aortic valve calcification.  Mild cardiac enlargement.  Large esophageal hiatal hernia.  Surgical absence of the gallbladder.  The unenhanced appearance of the liver, spleen, pancreas, adrenal glands, and retroperitoneal lymph nodes is unremarkable.  The inferior vena cava is flattened which may suggest hypovolemia.  Calcified and tortuous aorta without aneurysm.  The kidneys are atrophic without hydronephrosis. Calcification is present in the right renal hilum which could represent a nonobstructing stone or vascular calcifications.  The stomach is decompressed.  Small bowel are not abnormally distended. Diffusely stool filled colon with mild distension suggesting constipation.  No free air or free fluid in the abdomen.  There is an inferior midline anterior abdominal wall hernia containing fat. Herniated fat demonstrates stranding which may represent fat necrosis due to incarceration of the fat.  Pelvis:  Visualization of the low pelvis is limited due to streak artifact arising from right hip prosthesis.  The bladder appears to be decompressed with Foley catheter.  Uterus is surgically absent. No abnormal adnexal masses.  The appendix appears to be surgically absent.  No free or loculated pelvic fluid collections.  Stool filled rectosigmoid  colon without evidence of diverticulitis. There is infiltration in the subcutaneous fat over the right hip with subcutaneous gas collections and skin clips. Mild hematoma in the right iliac this muscle.  This likely represents recent hip surgery change.  No definite abscess.  Scoliosis and degenerative change in the lumbar spine.  No destructive bone lesions are appreciated.  IMPRESSION: Recent postoperative changes in the right hip and surrounding soft tissues.  Small bilateral pleural effusions with atelectasis or consolidation in the lung bases.  Stool filled colon suggesting constipation.  Anterior abdominal wall hernia containing fat with fat stranding suggesting possible fat necrosis.   Original Report Authenticated By: Burman Nieves, M.D.   Dg Chest 2 View  11/25/2012   *RADIOLOGY REPORT*  Clinical Data: Preoperative study for right hip surgery, history of hypertension  CHEST - 2 VIEW  Comparison: 09/14/2012  Findings: The heart size and vascular pattern are normal.  There is a moderate hiatal hernia.  There is eventration of the diaphragm on the right.  These findings are stable.  There is mild bibasilar scarring.  IMPRESSION: No acute findings   Original Report Authenticated By: Esperanza Heir, M.D.   Dg Hip Operative Right  12/20/2012   *RADIOLOGY REPORT*  Clinical Data: Post anterior approach right total hip replacement  DG OPERATIVE RIGHT HIP  Comparison: None.  Findings:  Two anterior projection images of the lower pelvis and right hip are provided for review.  Images demonstrate the sequela of right sided total hip replacement.  Alignment appears near anatomic on the provided anterior projection.  No definite fracture.  No definite radiopaque foreign body.  IMPRESSION: Post right total hip replacement without definite evidence of complication.   Original Report Authenticated By: Tacey Ruiz, MD   Ct Head Wo Contrast  12/03/2012   *RADIOLOGY REPORT*  Clinical Data: 79 year old female with acute  encephalopathy.  CT HEAD WITHOUT CONTRAST  Technique:  Contiguous axial images were obtained from the base of the skull through the vertex without contrast.  Comparison: 06/30/2012 and earlier.  Findings: Visualized paranasal sinuses and mastoids are clear.  No acute osseous abnormality identified.  Intermittent mild motion artifact.  No acute orbit or scalp soft tissue findings.  Calcified atherosclerosis at the skull base.  There is a left MCA M1 segment stent re-identified.  Stable cerebral volume.  Chronic left MCA encephalomalacia.  Stable ventricles with ex vacuo change to the left lateral ventricle. No midline shift, mass effect, or evidence of mass lesion.  No evidence of cortically based acute infarction identified.  No acute intracranial hemorrhage identified.  No suspicious intracranial vascular hyperdensity.  IMPRESSION: No acute intracranial abnormality. Stable noncontrast CT appearance of the brain with chronic left MCA infarct and left MCA M1 segment stent.   Original Report Authenticated By: Erskine Speed, M.D.   US Abdomen Complete  12/03/2012   *RADIOLOGY REPORT*  Clinical Data:  Elevated LFTs, acute on chronic renal failure  ABDOMINAL ULTRASOUND COMPLETE  Comparison:  CT abdomen/pelvis 07/04/2012  Findings:  Gallbladder:  The gallbladder is surgically absent.  Common Bile Duct:  Within normal limits in caliber.  Liver: Heterogeneous hepatic parenchyma without definite echogenicity.  The liver appears slightly hypoechoic prominent echogenicity of the portal triads.  IVC:  Poorly seen secondary to obscuring bowel gas.  Pancreas:  Not well seen secondary to obscuring bowel gas.  Spleen:  Sonographically unremarkable.  Normal in size 6.6 cm.  Right kidney:  Mild renal cortical atrophy.  No hydronephrosis. Extrarenal pelvis noted incidentally. Kidney measures 9.3 cm in length.  Left kidney:  Mild renal cortical atrophy.  No hydronephrosis.  The kidney measures 12.3 cm in length.  Abdominal Aorta:  Not  well seen secondary to obscuring bowel gas.  IMPRESSION:  1.  No acute abnormality.  No hydronephrosis. 2.  Slightly hypoechoic hepatic parenchyma with increased echogenicity of the portal triads.  While nonspecific, similar findings can be seen with acute hepatitis. 3.  Mild bilateral renal cortical atrophy. 4.  Surgical changes of cholecystectomy.   Original Report Authenticated By: Malachy Moan, M.D.   Dg Chest Port 1 View  12/13/2012   *RADIOLOGY REPORT*  Clinical Data: History asthma  PORTABLE CHEST - 1 VIEW  Comparison: Chest radiograph 12/11/2012  Findings: Interval removal of the NG tube.  Right central venous line remains.  Stable enlarged heart silhouette.  There is mild basilar atelectasis as small effusions.  No pulmonary edema or infiltrate identified.  IMPRESSION:  Bibasilar atelectasis and effusions.  No pulmonary edema.   Original Report Authenticated By: Genevive Bi, M.D.   Dg Chest Port 1 View  12/11/2012   *RADIOLOGY REPORT*  Clinical Data: Evaluate infiltrates  PORTABLE CHEST - 1 VIEW  Comparison: 12/10/2012  Findings: Cardiomediastinal silhouette is stable.  Stable NG tube position.  Stable right IJ central line position.  Elevation of the left hemidiaphragm again noted.  Persistent left basilar small atelectasis or infiltrate.  Mild right base atelectasis medially. No pulmonary edema.  Atherosclerotic calcifications of thoracic aorta again noted.  IMPRESSION: Stable NG tube position.  Stable right IJ central line position. Elevation of the left hemidiaphragm again noted.  Persistent left basilar small atelectasis or infiltrate.  Mild right base atelectasis medially. No pulmonary edema   Original Report Authenticated By: Natasha Mead, M.D.   Dg Chest Port 1 View  12/10/2012   *RADIOLOGY REPORT*  Clinical Data: Pneumonia.  PORTABLE CHEST - 1 VIEW  Comparison: 12/09/2012  Findings: The patient has been extubated.  Nasogastric tube and central line remain in stable position.  Lungs  show some improvement in aeration with residual atelectasis/infiltrates remaining in both bases.  No edema or large pleural effusions are identified.  IMPRESSION: Improved aeration bilaterally.   Original Report Authenticated By: Irish Lack, M.D.   Dg Chest Port 1 View  12/09/2012   *RADIOLOGY REPORT*  Clinical Data: Evaluate endotracheal tube placement.  PORTABLE CHEST - 1 VIEW  Comparison: Chest x-ray 12/07/2012.  Findings: An endotracheal tube is in place with tip 1.0 cm above the carina. There is a right-sided internal jugular central venous catheter with tip terminating in the superior cavoatrial junction. A nasogastric tube is seen extending into the stomach, however, the tip of the nasogastric tube extends below the lower margin of the image.  Lung volumes are low.  There are bibasilar opacities which may reflect areas of atelectasis and/or consolidation, with superimposed small to moderate bilateral pleural effusions (left greater than right), which appear increased.  Mild pulmonary venous congestion, without frank pulmonary edema.  Heart size appears borderline enlarged, likely accentuated by low lung volumes and patient rotation to the left which distorts the mediastinal contours.  Atherosclerosis of the thoracic aorta.  IMPRESSION: 1.  Support apparatus, as above.  Endotracheal tube is low lying, only 1 cm above the carina, and could be withdrawn approximately 3 cm for more optimal placement. 2.  Low lung volumes with persistent  bibasilar atelectasis and/or consolidation, and increasing small to moderate bilateral pleural effusions (left greater than right). 3.  Atherosclerosis.   Original Report Authenticated By: Trudie Reed, M.D.   Dg Chest Port 1 View  12/07/2012   *RADIOLOGY REPORT*  Clinical Data: Check endotracheal tube.  PORTABLE CHEST - 1 VIEW  Comparison: 12/06/2012  Findings: Appliances are unchanged in position.  Shallow inspiration.  Cardiac enlargement without pulmonary vascular  congestion.  Consolidation in the left base with probable small left pleural effusion.  No significant change.  No pneumothorax.  IMPRESSION: Appliances appear stable in position.  Cardiac enlargement. Consolidation and probable small effusion in the left base.   Original Report Authenticated By: Burman Nieves, M.D.   Dg Chest Port 1 View  12/06/2012   *RADIOLOGY REPORT*  Clinical Data: Central line placement.  PORTABLE CHEST - 1 VIEW  Comparison: Earlier today.  Findings: Interval right jugular catheter with its tip in the inferior aspect of the superior vena cava near the cavoatrial junction.  No pneumothorax.  Endotracheal tube in satisfactory position.  Nasogastric tube extending into the stomach.  Mildly improved aeration at the right lung base and in the left perihilar region.  Stable dense left basilar airspace consolidation.  IMPRESSION:  1.  Right jugular catheter tip in the inferior aspect of the superior vena cava without pneumothorax. 2.  Decreased bilateral atelectasis. 3.  Stable dense left basilar atelectasis or pneumonia.   Original Report Authenticated By: Beckie Salts, M.D.   Dg Chest Port 1 View  12/06/2012   *RADIOLOGY REPORT*  Clinical Data: Central line placement.  Acute respiratory failure on ventilator.  PORTABLE CHEST - 1 VIEW  Comparison: 12/05/2012  Findings: Had a left internal jugular center venous catheter is seen with the tip now pulled back into the left internal jugular vein overlying the medial clavicle.  Endotracheal tube and nasogastric tube remain in appropriate position.  Left greater than right lower lung infiltrates are again seen with a small to moderate left pleural effusion.  These findings are stable.  Heart size is unchanged.  IMPRESSION:  1.  Left jugular center venous catheter tip has been pulled back, now overlying the inferior left internal jugular vein. 2.  No significant change in the right greater than left lower lung infiltrates and left pleural effusion.    Original Report Authenticated By: Myles Rosenthal, M.D.   Dg Chest Port 1 View  12/05/2012   *RADIOLOGY REPORT*  Clinical Data: Post thoracentesis  PORTABLE CHEST - 1 VIEW  Comparison: Portable chest x-ray of 12/05/2012.  Findings:  The tip of the endotracheal tube is approximally 2.3 cm above the carina.  There does appear to have been reduction in the volume of the right pleural effusion.  The side of the thoracentesis was not given.  Left pleural effusion remains with bibasilar atelectasis present.  No pneumothorax is seen.  A left central venous line tip overlies the mid SVC.  IMPRESSION:  1.  Apparent reduction in volume of the right pleural effusion.  No pneumothorax post thoracentesis. 2.  Left pleural effusion and bibasilar atelectasis remain. 3.  Tip of endotracheal tube 2.3 cm above the carina.   Original Report Authenticated By: Dwyane Dee, M.D.   Portable Chest Xray In Am  12/05/2012   *RADIOLOGY REPORT*  Clinical Data: Intubation, confirm endotracheal tube placement  PORTABLE CHEST - 1 VIEW  Comparison: Portable exam 0647 hours compared to 12/04/2012  Findings: Tip of endotracheal tube projects 2.9 cm above carina. Severely rotated to  the left. Nasogastric tube coiled the stomach. Tip of left jugular central venous catheter projects over SVC. Upper normal heart size. Atherosclerotic calcification aorta. Persistent right basilar consolidation. Atelectasis and effusion in left lower lobe, cannot exclude left lower lobe infiltrate as well.  IMPRESSION: Persistent right basilar consolidation. Pleural effusion and atelectasis at left lung base, cannot exclude left lower lobe infiltrate.   Original Report Authenticated By: Ulyses Southward, M.D.   Dg Chest Port 1 View  12/04/2012   *RADIOLOGY REPORT*  Clinical Data: Intubation.  PORTABLE CHEST - 1 VIEW  Comparison: 12/04/2012  Findings: Endotracheal tube is approximately 3 cm above the carina. Left central line is unchanged.  NG tube coils in the stomach.   Cardiomegaly with bilateral lower lobe airspace opacities and effusions, increasing on the right since prior study.  IMPRESSION: Endotracheal tube 3 cm above the carina.  Bilateral effusions with bibasilar atelectasis or infiltrates.  Cardiomegaly.   Original Report Authenticated By: Charlett Nose, M.D.   Dg Chest Portable 1 View  12/04/2012   *RADIOLOGY REPORT*  Clinical Data: Central line placement, asthma.  PORTABLE CHEST - 1 VIEW  Comparison: 12/04/2012  Findings: Left central line is in place with the tip in the SVC. No pneumothorax.  Mild cardiomegaly.  Improving aeration in the right base.  Continued left lower lobe atelectasis or infiltrate with small left effusion.  No acute bony abnormality.  IMPRESSION: Left central line tip in the SVC.  No pneumothorax.  Continued left basilar atelectasis or infiltrate with left effusion.   Original Report Authenticated By: Charlett Nose, M.D.   Dg Chest Port 1 View  12/04/2012   *RADIOLOGY REPORT*  Clinical Data: Short of breath, sepsis  PORTABLE CHEST - 1 VIEW  Comparison: Prior chest x-ray 12/03/2012  Findings: Stable cardiomegaly.  Probable hiatal hernia. Atherosclerotic calcifications in the transverse aorta.  Slightly lower inspiratory volumes with increased bibasilar opacities. Lower volumes results in crowding of the pulmonary vasculature. There may be slightly increased vascular congestion but no overt edema.  No pneumothorax.  IMPRESSION: Lower inspiratory volumes with increased bibasilar opacities and slightly increased pulmonary vascular congestion.   Original Report Authenticated By: Malachy Moan, M.D.   Dg Chest Port 1 View  12/03/2012   *RADIOLOGY REPORT*  Clinical Data: Postop hip replacement, hypoxemia  PORTABLE CHEST - 1 VIEW  Comparison: Chest x-ray of 11/25/2012  Findings: There is bibasilar atelectasis, with more opacity at the left lung base also suggesting the presence of left pleural effusion.  A two-view chest x-ray would be helpful.   Cardiomegaly is stable.  No acute bony abnormality is seen with degenerative change noted in both shoulders.  IMPRESSION: Diminished aeration with bibasilar atelectasis.  Suspect left pleural effusion as well.  Consider two-view chest x-ray.   Original Report Authenticated By: Dwyane Dee, M.D.   Dg Abd Portable 1v  12/09/2012   *RADIOLOGY REPORT*  Clinical Data: NG tube placement, respiratory failure  PORTABLE ABDOMEN - 1 VIEW  Comparison: Plain films 12/09/2012, CT 12/03/2012  Findings: The NG tube loops above the diaphragms likely within the large hiatal hernia seen on comparison CT.  IMPRESSION: NG tube looped back within the hiatal hernia above the hemidiaphragms.   Original Report Authenticated By: Genevive Bi, M.D.   Dg Abd Portable 1v  12/09/2012   *RADIOLOGY REPORT*  Clinical Data: Abdominal pain and tenderness.  PORTABLE ABDOMEN - 1 VIEW  Comparison: Pelvic radiograph performed 12/07/2012, and CT of the abdomen and pelvis performed 12/03/2012  Findings: The visualized colon  appears largely filled with stool, raising concern for some degree of constipation.  There is no evidence of small bowel dilatation to suggest obstruction.  No free intra-abdominal air is identified, though evaluation for free air suboptimal on a single supine view.   Clips are noted within the right upper quadrant, reflecting prior cholecystectomy.  The patient's enteric tube is noted ending overlying the body of the stomach.  No acute osseous abnormalities are seen.  The patient's right hip arthroplasty is grossly unremarkable in appearance, though incompletely imaged.  IMPRESSION: Relatively large stool burden noted, raising concern for some degree of constipation.  No evidence for bowel obstruction; no free intra-abdominal air seen.   Original Report Authenticated By: Tonia Ghent, M.D.   Dg Abd Portable 1v  12/07/2012   *RADIOLOGY REPORT*  Clinical Data: Abdominal pain  PORTABLE ABDOMEN - 1 VIEW  Comparison: 12/04/2012   Findings: NG tube in the stomach.  Negative for bowel obstruction. Moderate constipation with stool throughout the colon.  This is similar to the prior study.  Clips in the gallbladder fossa.  Right hip replacement in satisfactory position.  Levoscoliosis.  IMPRESSION: Constipation without bowel obstruction.   Original Report Authenticated By: Janeece Riggers, M.D.   Dg Abd Portable 1v  12/04/2012   *RADIOLOGY REPORT*  Clinical Data: OG tube placement.  Distended abdomen.  PORTABLE ABDOMEN - 1 VIEW  Comparison: CT 12/03/2012  Findings: NG tube coils in the stomach.  The tip is in the body of the stomach.  Large stool burden throughout the colon.  IMPRESSION: NG tube coils in the stomach.   Original Report Authenticated By: Charlett Nose, M.D.   Dg Abd Portable 1v  12/03/2012   *RADIOLOGY REPORT*  Clinical Data: Right hip arthroplasty.  Abdominal pain  PORTABLE ABDOMEN - 1 VIEW  Comparison: 07/04/2012  Findings: Large amount of stool is present throughout the colon compatible with constipation.  No bowel obstruction.  Lumbar levoscoliosis.  Right hip replacement in satisfactory position.  Gallbladder clips in the right upper quadrant.  IMPRESSION: Moderate to severe constipation.  Negative for bowel obstruction.   Original Report Authenticated By: Janeece Riggers, M.D.    Scheduled Meds: . chlorhexidine  15 mL Mouth/Throat BID  . Chlorhexidine Gluconate Cloth  6 each Topical Q0600  . [START ON 01/01/2013] cyanocobalamin  1,000 mcg Intramuscular Q30 days  . diltiazem  120 mg Oral Daily  . docusate sodium  100 mg Oral BID  . ferrous sulfate  325 mg Oral Q breakfast  . imipenem-cilastatin  500 mg Intravenous Q8H  . mometasone-formoterol  2 puff Inhalation BID  . mupirocin ointment  1 application Nasal BID  . pantoprazole  40 mg Oral Q1200  . polyethylene glycol  17 g Oral Daily  . potassium chloride  20 mEq Oral BID  . tiotropium  18 mcg Inhalation Daily  . vancomycin  1,250 mg Intravenous Q24H   Continuous  Infusions: . dextrose 20 mL/hr at 12/12/12 2213  . heparin 1,800 Units/hr (12/14/12 0350)    Principal Problem:   DJD (degenerative joint disease) of hip Active Problems:   Acute encephalopathy   Acute on chronic renal failure   Hyperkalemia   Hypotension, unspecified   Abdominal pain, unspecified site   Severe sepsis(995.92)    Time spent: 40 minutes   [facility]  [facility] Pager [phone]. If 8PM-8AM, please contact night-coverage at [email or web address], password [facility] 12/14/2012, 8:41 AM  LOS: 12 days

## 2012-12-14 NOTE — Progress Notes (Signed)
ANTICOAGULATION CONSULT NOTE - Follow Up Consult  Pharmacy Consult for heparin Indication: atrial fibrillation and ischemia of feet/hand  Labs:  Recent Labs  12/12/12 0500  12/13/12 0430  12/13/12 1450 12/13/12 1835 12/14/12 0300 12/14/12 1245  HGB 8.3*  --  7.1*  --  9.8*  --  9.0*  --   HCT 25.4*  --  21.5*  --  29.0*  --  26.6*  --   PLT 221  --  236  --  282  --  281  --   HEPARINUNFRC  --   < >  --   < >  --  <0.10* 0.21* 0.36  CREATININE 1.04  --  1.21*  --   --   --  1.24*  --   < > = values in this interval not displayed.   Assessment: 79yo female on heparin on 1800units/hr and heparin level is at goal (HL= 0.36).  Goal of Therapy:  Heparin level 0.3-0.5 units/ml   Plan:  -No heparin changes needed -Will confirm a heparin level later today  Harland German, Pharm D 12/14/2012 1:27 PM

## 2012-12-14 NOTE — Progress Notes (Signed)
Dr informed re coarse breath sounds all lung fields .meds and vs reviewed .Grandson currently at bedside. the patient occ spontaneously opening eyes to glance at speakers in rm . Dr aware that Pt was earlier calling(yelling out ) for pain med while one of her daughters was in rm . @ 1410 pm  the daughter left, stating that she would speak with her sister then return with info re whether or not  to medicate the Pt for pain. The daughter has not yet returned and the Pt (who continued to call out for pain meds continuosly) was given dilaudid IV for the pain in rt hip and bil LE's . Order for IV lasix received.

## 2012-12-15 LAB — COMPREHENSIVE METABOLIC PANEL
ALT: 9 U/L (ref 0–35)
Albumin: 1.1 g/dL — ABNORMAL LOW (ref 3.5–5.2)
Alkaline Phosphatase: 79 U/L (ref 39–117)
BUN: 32 mg/dL — ABNORMAL HIGH (ref 6–23)
Chloride: 110 mEq/L (ref 96–112)
Glucose, Bld: 74 mg/dL (ref 70–99)
Potassium: 3.5 mEq/L (ref 3.5–5.1)
Sodium: 141 mEq/L (ref 135–145)
Total Bilirubin: 0.6 mg/dL (ref 0.3–1.2)
Total Protein: 4.3 g/dL — ABNORMAL LOW (ref 6.0–8.3)

## 2012-12-15 LAB — HEPARIN LEVEL (UNFRACTIONATED): Heparin Unfractionated: 0.2 IU/mL — ABNORMAL LOW (ref 0.30–0.70)

## 2012-12-15 LAB — CBC
HCT: 25.2 % — ABNORMAL LOW (ref 36.0–46.0)
MCV: 89 fL (ref 78.0–100.0)
Platelets: 316 10*3/uL (ref 150–400)
RBC: 2.83 MIL/uL — ABNORMAL LOW (ref 3.87–5.11)
WBC: 7.4 10*3/uL (ref 4.0–10.5)

## 2012-12-15 MED ORDER — OXYCODONE-ACETAMINOPHEN 5-325 MG/5ML PO SOLN
5.0000 mL | ORAL | Status: DC | PRN
  Administered 2012-12-15 – 2012-12-16 (×2): 5 mL via ORAL
  Filled 2012-12-15 (×2): qty 5

## 2012-12-15 MED ORDER — HEPARIN (PORCINE) IN NACL 100-0.45 UNIT/ML-% IJ SOLN
2500.0000 [IU]/h | INTRAMUSCULAR | Status: DC
  Administered 2012-12-15: 2300 [IU]/h via INTRAVENOUS
  Administered 2012-12-16: 2500 [IU]/h via INTRAVENOUS
  Filled 2012-12-15 (×5): qty 250

## 2012-12-15 MED ORDER — SODIUM CHLORIDE 0.9 % IV SOLN
INTRAVENOUS | Status: DC
  Administered 2012-12-16: 10 mL/h via INTRAVENOUS

## 2012-12-15 MED ORDER — CHLORHEXIDINE GLUCONATE 0.12 % MT SOLN
15.0000 mL | Freq: Two times a day (BID) | OROMUCOSAL | Status: DC
  Administered 2012-12-15: 15 mL via OROMUCOSAL
  Filled 2012-12-15: qty 15

## 2012-12-15 MED ORDER — BIOTENE DRY MOUTH MT LIQD
15.0000 mL | Freq: Four times a day (QID) | OROMUCOSAL | Status: DC
  Administered 2012-12-15 (×2): 15 mL via OROMUCOSAL

## 2012-12-15 NOTE — Progress Notes (Signed)
ANTICOAGULATION CONSULT NOTE - Follow Up Consult  Pharmacy Consult for heparin Indication: atrial fibrillation and ischemia of feet/hand  Labs:  Recent Labs  12/13/12 0430  12/13/12 1450  12/14/12 0300 12/14/12 1245 12/14/12 1940 12/15/12 0400  HGB 7.1*  --  9.8*  --  9.0*  --   --  8.2*  HCT 21.5*  --  29.0*  --  26.6*  --   --  25.2*  PLT 236  --  282  --  281  --   --  316  HEPARINUNFRC  --   < >  --   < > 0.21* 0.36 0.10* 0.26*  CREATININE 1.21*  --   --   --  1.24*  --   --   --   < > = values in this interval not displayed.   Assessment: 79yo female remains subtherapeutic on heparin after rate increase though approaching goal.  Goal of Therapy:  Heparin level 0.3-0.5 units/ml   Plan:  Will increase heparin gtt by 1 units/kg/hr to 2100 units/hr and check level in 8hr.  Vernard Gambles, PharmD, BCPS  12/15/2012,5:20 AM

## 2012-12-15 NOTE — Progress Notes (Signed)
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  If would defer any future toe amputation vs TMA to orthopedics which is already seeing to patient.  No immediate amputation needed.  Continue anticoagulation for now  Echo already done without any obvious intracardiac thrombus.  CTA cannot be done with increase in creatinine.  Meanwhile, would obtain bilateral leg arterial duplex will awaiting renal function recovery.  Subjective    C/o pain in feet  Objective Filed Vitals:   12/15/12 0631 12/15/12 0700 12/15/12 0800 12/15/12 0834  BP:      Pulse:  91    Temp:   99.4 F (37.4 C)   TempSrc:   Oral   Resp:  16    Height:      Weight:      SpO2: 100% 100%  100%    Intake/Output Summary (Last 24 hours) at 12/15/12 0952 Last data filed at 12/15/12 0700  Gross per 24 hour  Intake 1399.62 ml  Output    975 ml  Net 424.62 ml    GEN  More alert and interactive PULM B coase BS CV  RRR VASC  B feet with ischemic toes, not significantly different, B legs with 1-2+ edema, dependent rubor B  Laboratory CBC    Component Value Date/Time   WBC 7.4 12/15/2012 0400   HGB 8.2* 12/15/2012 0400   HCT 25.2* 12/15/2012 0400   PLT 316 12/15/2012 0400    BMET    Component Value Date/Time   NA 141 12/15/2012 0400   K 3.5 12/15/2012 0400   CL 110 12/15/2012 0400   CO2 21 12/15/2012 0400   GLUCOSE 74 12/15/2012 0400   BUN 32* 12/15/2012 0400   CREATININE 1.35* 12/15/2012 0400   CALCIUM 8.5 12/15/2012 0400   GFRNONAA 36* 12/15/2012 0400   GFRAA 42* 12/15/2012 0400    Leonides Sake, MD [facility] Office: [phone] Pager: [phone]  12/15/2012, 9:52 AM

## 2012-12-15 NOTE — Progress Notes (Signed)
ANTICOAGULATION CONSULT NOTE - Follow Up Consult  Pharmacy Consult for Heparin Indication: atrial fibrillation and ischemia of feet/hand  Allergies  Allergen Reactions  . Solarcaine Aloe Extra (Lidocaine Hcl)   . Septra (Bactrim)   . Cephalexin Rash    Patient Measurements: Height: 5\' 6"  (167.6 cm) Weight: 212 lb 4.9 oz (96.3 kg) IBW/kg (Calculated) : 59.3 Heparin Dosing Weight: ~80kg  Vital Signs: Temp: 100 F (37.8 C) (07/21 1200) Temp src: Core (Comment) (07/21 1200) BP: 105/54 mmHg (07/21 1340) Pulse Rate: 94 (07/21 1340)  Labs:  Recent Labs  12/13/12 0430  12/13/12 1450  12/14/12 0300  12/14/12 1940 12/15/12 0400 12/15/12 1300  HGB 7.1*  --  9.8*  --  9.0*  --   --  8.2*  --   HCT 21.5*  --  29.0*  --  26.6*  --   --  25.2*  --   PLT 236  --  282  --  281  --   --  316  --   HEPARINUNFRC  --   < >  --   < > 0.21*  < > 0.10* 0.26* 0.20*  CREATININE 1.21*  --   --   --  1.24*  --   --  1.35*  --   < > = values in this interval not displayed.  Estimated Creatinine Clearance: 39.5 ml/min (by C-G formula based on Cr of 1.35).   Medications: Heparin @ 2300 units/hr  Assessment: 81yom s/p right THA 7/8 who developed septic shock. On 7/17 VVS was consulted due discolored toes bilaterally c/w emoblic phenomena vs pressor induced ischemia. She was started on heparin. Today she continues on heparin with a subtherapeutic heparin level despite rate increase this morning. No issues with infusion. Hgb is low at 8.2 - has received 5 units total of PRBCs thus far this admission. No bleeding reported.  Goal of Therapy:  Heparin level 0.3-0.5 units/ml Monitor platelets by anticoagulation protocol: Yes   Plan:  1) Increase heparin to 2300 units/hr 2) Check 8 hour heparin level  Natalie Kent 12/15/2012,2:35 PM

## 2012-12-15 NOTE — Progress Notes (Signed)
Physical Therapy Treatment Patient Details Name: Natalie Kent MRN: [id number] DOB: 1930/11/14 Today's Date: 12/15/2012 Time: 3557-3220 PT Time Calculation (min): 25 min  PT Assessment / Plan / Recommendation  PT Comments   Pt fatigues very quickly and tolerates only minimal activity.  Follow Up Recommendations  SNF     Does the patient have the potential to tolerate intense rehabilitation     Barriers to Discharge        Equipment Recommendations  None recommended by PT    Recommendations for Other Services    Frequency Min 3X/week   Progress towards PT Goals Progress towards PT goals: Not progressing toward goals - comment  Plan Frequency needs to be updated    Precautions / Restrictions Precautions Precautions: Fall;Anterior Hip Restrictions RLE Weight Bearing: Weight bearing as tolerated   Pertinent Vitals/Pain Bil foot pain.  Repositioned. Pt had pain meds prior.    Mobility  Bed Mobility Supine to Sit: 1: +2 Total assist;HOB elevated Supine to Sit: Patient Percentage: 10% Sitting - Scoot to Edge of Bed: 1: +2 Total assist Sitting - Scoot to Edge of Bed: Patient Percentage: 10% Sit to Supine: 1: +2 Total assist;HOB flat Sit to Supine: Patient Percentage: 20% Details for Bed Mobility Assistance: Assist to move legs and raise trunk up.    Exercises Total Joint Exercises Short Arc Quad: PROM;Both;5 reps;Supine Heel Slides: PROM;Both;5 reps;Supine General Exercises - Upper Extremity Shoulder Flexion: AAROM;Right;5 reps;Supine Elbow Flexion: AAROM;Right;5 reps;Supine   PT Diagnosis:    PT Problem List:   PT Treatment Interventions:     PT Goals (current goals can now be found in the care plan section)    Visit Information  Last PT Received On: 12/15/12 Assistance Needed: +2 History of Present Illness: s/p elective R THA anterior approach, pt now with septic shock, UTI, acute kidney injury.  Pt with dry gangrene of bil feet.    Subjective Data       Cognition  Cognition Arousal/Alertness: Awake/alert Behavior During Therapy: Anxious Area of Impairment: Attention;Following commands Current Attention Level: Focused Following Commands: Follows one step commands inconsistently    Balance  Static Sitting Balance Static Sitting - Balance Support: Feet supported;Bilateral upper extremity supported Static Sitting - Level of Assistance: 2: Max assist;4: Min assist;3: Mod assist Static Sitting - Comment/# of Minutes: Initially max A due to heavy posterior lean.  Then progressed to need only min A before fatiguing after 5 minutes and then needind mod A. Sat EOB 6 minutes.  End of Session PT - End of Session Equipment Utilized During Treatment: Gait belt;Oxygen Activity Tolerance: Patient limited by pain;Patient limited by fatigue Patient left: in bed;with call bell/phone within reach;with family/visitor present Nurse Communication: Mobility status   GP     [facility] 12/15/2012, 12:26 PM  [facility] PT [phone]

## 2012-12-15 NOTE — Progress Notes (Signed)
[facility] Progress Note [name] TEAM 1 - Stepdown/ICU TEAM   Natalie Kent HYQ:[id number] DOB: 05-14-1931 DOA: 12/01/2012 PCP: Londell Moh, MD  Brief narrative: 79 year old female with complicated hospitalization, underwent right total hip replacement this admission with post op respiratory failure, acute on chronic renal failure, pneumonia, septic shock, atrial fibrillation, and GI bleeding. On 12/11/12, pt had complaints of burning in both feet and bluish color to toes. Pt was weaned off vasopressors on 12/09/12. She was found to have a possible embolic event to the toes in setting of new onset atrial fibrillation. Phenylephrine was used from 12/07/12 and weaned 12/10/12. Consideration has been given to vasopressor related vasoconstriction as the etiology for her distal ischemic sx's since the discoloration started the day after neo was started.  Patient was on Plavix prior to admission. Vascular Surgery was consulted and recommended CTA chest/abd/pelvis with B leg runoff which has not yet been completed due to renal failure. She was stable enough to transfer out of ICU and Team 1 assumed care of the patient 7/19.   Assessment/Plan:  Acute respiratory failure 2nd to:  A) ?? HCAP B) COPD / asthma.  - PCXR on 7/16 showed stable bilateral lower lobe infiltrates vs effusions and CXR 7/19 stable and without infiltrate - repeat CXR in AM -still requiring oxygen -having low grade fevers but suspect these due to toe ischemia and not PNA so will continue empiric anbx's -blood cx's NGTD -cont prn xopenex nebs and Spiriva -oxygen to keep SpO2 > 92%  -holding singulair for now   Shock -PCCM felt due to PNA -Hgb  dropped to 7.7 post op from a preop baseline of 11.2 so volume status may have contributed  Bilateral digital ischemia right hand and bilateral feet/toes -Started heparin gtt per pharmacy  -had Echo on 7/10 >> defer to VVS whether another Echo needed  -VVS rec arterial  duplex in lieu of arteriogarm or CTA -has dry changes so no urgent need to pursue amputation - VVS prefers wait and watch approach and defers any future amputation to Orthopedics -VVS d/w family re: risk of renal failure, increased mortality from vascular procedure, with extremely high morbidity and mortality risk,  -Family agreeable to proceed after discussing with vascular surgery  Atrial fib / controlled VR -cont CCB and prn Lopressor -Maintaining sinus rhythm  HTN -as above  Acute renal failure on CKD 3 -baseline Scr 1.1 with GFR 46 -peak Scr this admit 2.69 and now has returned to near baseline  Anemia due to: A) chronic disease B) Acute critical illness C) ABL post op -as above (see shock) -cont iron replete -transfuse only if Hgb </= 7.0 -ASA/Plavix on hold due to anema and low platelets -has rec. 5 units PRBC's this admission  Acute encephalopathy -due to shock and acute resp failure- resolved  OA post right hip arthroplasty -per ortho -cont PT/OT   DVT prophylaxis: Heparin infusion Code Status: Full Family Communication: Patient and daughter at bedside Disposition Plan: Remain in step down  Consultants: Vascular Surgery PCCM Orthopedics  Procedures: Right total hip arthroplasty with anterior approach  Dr. Jerl Santos  12/13/2012  Antibiotics: Vancomycin 7/08 >>  Imipenem 7/09 >>7/19 Maxipime 7/20 >>  CULTURES:  7/09 Urine >> negative  7/09 Blood >> no growth  7/09 Sputum >> Yeast    HPI/Subjective: Patient alert and quite anxious. Reporting that she is uncomfortable the bed. Denies chest pain or shortness of breath  Objective: Blood pressure 101/39, pulse 94, temperature 99.4 F (37.4 C),  temperature source Oral, resp. rate 14, height 5\' 6"  (1.676 m), weight 96.3 kg (212 lb 4.9 oz), SpO2 100.00%.  Intake/Output Summary (Last 24 hours) at 12/15/12 1232 Last data filed at 12/15/12 1200  Gross per 24 hour  Intake 1790.62 ml  Output   1065 ml   Net 725.62 ml    Exam: General: No acute respiratory distress Lungs: Clear to auscultation bilaterally without wheezes or crackles,  2L Cardiovascular: Regular rate and rhythm without murmur gallop or rub normal S1 and S2, mild 1+ bilateral lower extremity peripheral edema or JVD--noted with bilateral ischemic changes as evidenced by toe necrosis: Involves toes one through 3 on the left and 1 through 4 on the right was clear demarcations. This is also associated with streaking erythema up both legs to about 4 inches below the knees- an irregular pattern Abdomen: Nontender, nondistended, soft, bowel sounds positive, no rebound, no ascites, no appreciable mass Musculoskeletal: No significant cyanosis, clubbing of bilateral lower extremities Neurological: Alert and oriented x at least name and place, moves all extremities x 4 without focal neurological deficits, CN 2-12 intact  Scheduled Meds: Scheduled Meds: . antiseptic oral rinse  15 mL Mouth Rinse QID  . ceFEPime (MAXIPIME) IV  2 g Intravenous Q24H  . chlorhexidine  15 mL Mouth/Throat BID  . Chlorhexidine Gluconate Cloth  6 each Topical Q0600  . [START ON 01/01/2013] cyanocobalamin  1,000 mcg Intramuscular Q30 days  . diltiazem  120 mg Oral Daily  . docusate sodium  100 mg Oral BID  . mometasone-formoterol  2 puff Inhalation BID  . mupirocin ointment  1 application Nasal BID  . pantoprazole  40 mg Oral Q1200  . tiotropium  18 mcg Inhalation Daily  . vancomycin  1,250 mg Intravenous Q24H   Continuous Infusions: . dextrose 10 mL (12/14/12 1453)  . heparin 2,100 Units/hr (12/15/12 0521)    Data Reviewed: Basic Metabolic Panel:  Recent Labs Lab 12/09/12 0400  12/11/12 0400 12/12/12 0500 12/13/12 0430 12/14/12 0300 12/15/12 0400  NA 148*  < > 150* 145 140 140 141  K 3.1*  < > 3.3* 3.8 3.5 3.3* 3.5  CL 115*  < > 119* 116* 113* 110 110  CO2 22  < > 23 24 21 20 21   GLUCOSE 112*  < > 103* 94 75 77 74  BUN 66*  < > 47* 37* 33*  31* 32*  CREATININE 1.49*  < > 1.06 1.04 1.21* 1.24* 1.35*  CALCIUM 8.6  < > 9.1 9.0 8.3* 8.5 8.5  MG 2.3  --  2.1  --   --  1.9  --   PHOS 5.2*  --   --   --   --   --   --   < > = values in this interval not displayed. Liver Function Tests:  Recent Labs Lab 12/09/12 0500 12/14/12 0300 12/15/12 0400  AST 60* 25 18  ALT 43* 10 9  ALKPHOS 76 89 79  BILITOT 0.8 0.8 0.6  PROT 4.4* 4.5* 4.3*  ALBUMIN 1.5* 1.3* 1.1*    Recent Labs Lab 12/09/12 0500  LIPASE 84*  AMYLASE 48   CBC:  Recent Labs Lab 12/12/12 0500 12/13/12 0430 12/13/12 1450 12/14/12 0300 12/15/12 0400  WBC 16.3* 10.5 12.4* 9.3 7.4  HGB 8.3* 7.1* 9.8* 9.0* 8.2*  HCT 25.4* 21.5* 29.0* 26.6* 25.2*  MCV 88.8 88.8 87.6 88.1 89.0  PLT 221 236 282 281 316   Cardiac Enzymes:  Recent Labs Lab 12/09/12  0500  CKTOTAL 69   BNP (last 3 results)  Recent Labs  12/05/12 0918 12/08/12 0140  PROBNP 7718.0* 3055.0*   CBG:  Recent Labs Lab 12/11/12 12/11/12 0344 12/11/12 0837 12/11/12 1238 12/11/12 1737  GLUCAP 73 94 87 71 79    Recent Results (from the past 240 hour(s))  CULTURE, BAL-QUANTITATIVE     Status: None   Collection Time    12/05/12  3:06 PM      Result Value Range Status   Specimen Description BRONCHIAL ALVEOLAR LAVAGE   Final   Special Requests NONE   Final   Gram Stain     Final   Value: NO WBC SEEN     NO SQUAMOUS EPITHELIAL CELLS SEEN     NO ORGANISMS SEEN   Colony Count 25,000 COLONIES/ML   Final   Culture YEAST   Final   Report Status 12/07/2012 FINAL   Final  CULTURE, BLOOD (ROUTINE X 2)     Status: None   Collection Time    12/13/12  2:27 PM      Result Value Range Status   Specimen Description BLOOD RIGHT HAND   Final   Special Requests BOTTLES DRAWN AEROBIC ONLY 3CC   Final   Culture  Setup Time 12/13/2012 20:32   Final   Culture     Final   Value:        BLOOD CULTURE RECEIVED NO GROWTH TO DATE CULTURE WILL BE HELD FOR 5 DAYS BEFORE ISSUING A FINAL NEGATIVE REPORT    Report Status PENDING   Incomplete  CULTURE, BLOOD (ROUTINE X 2)     Status: None   Collection Time    12/13/12  2:31 PM      Result Value Range Status   Specimen Description BLOOD BLOOD LEFT FOREARM   Final   Special Requests BOTTLES DRAWN AEROBIC AND ANAEROBIC 8CC   Final   Culture  Setup Time 12/13/2012 20:32   Final   Culture     Final   Value:        BLOOD CULTURE RECEIVED NO GROWTH TO DATE CULTURE WILL BE HELD FOR 5 DAYS BEFORE ISSUING A FINAL NEGATIVE REPORT   Report Status PENDING   Incomplete  CLOSTRIDIUM DIFFICILE BY PCR     Status: None   Collection Time    12/13/12  7:44 PM      Result Value Range Status   C difficile by pcr NEGATIVE  NEGATIVE Final     Studies:  Recent x-ray studies have been reviewed in detail by the Attending Physician    Junious Silk, ANP [facility] Office  [phone] Pager [phone]  **If unable to reach the above provider after paging please contact the Flow Manager @ [phone]  On-Call/Text Page:      [email or web address]      password [email or web address]  If 7PM-7AM, please contact night-coverage [email or web address] Password [email or web address] 12/15/2012, 12:32 PM   LOS: 13 days   I have personally examined this patient and reviewed the entire database. I have reviewed the above note, made any necessary editorial changes, and agree with its content.  Lonia Blood, MD [facility]

## 2012-12-15 NOTE — Progress Notes (Signed)
Subjective: 13 Days Post-Op Procedure(s) (LRB): TOTAL HIP ARTHROPLASTY ANTERIOR APPROACH (Right) Dry gangrene toes bilaterally Activity level:  Bed to chair transfers. May put weight on right hip. Diet tolerance:  Pured Voiding:  Foley catheter Patient reports pain as 2 on 0-10 scale and 3 on 0-10 scale.    Objective: Vital signs in last 24 hours: Temp:  [99.4 F (37.4 C)-101.3 F (38.5 C)] 99.4 F (37.4 C) (07/21 0800) Pulse Rate:  [61-110] 94 (07/21 1100) Resp:  [12-26] 14 (07/21 1100) BP: (90-148)/(33-72) 101/39 mmHg (07/21 0800) SpO2:  [93 %-100 %] 100 % (07/21 1100) Weight:  [96.3 kg (212 lb 4.9 oz)] 96.3 kg (212 lb 4.9 oz) (07/21 0445)  Labs:  Recent Labs  12/13/12 0430 12/13/12 1450 12/14/12 0300 12/15/12 0400  HGB 7.1* 9.8* 9.0* 8.2*    Recent Labs  12/14/12 0300 12/15/12 0400  WBC 9.3 7.4  RBC 3.02* 2.83*  HCT 26.6* 25.2*  PLT 281 316    Recent Labs  12/14/12 0300 12/15/12 0400  NA 140 141  K 3.3* 3.5  CL 110 110  CO2 20 21  BUN 31* 32*  CREATININE 1.24* 1.35*  GLUCOSE 77 74  CALCIUM 8.5 8.5   No results found for this basename: LABPT, INR,  in the last 72 hours  Physical Exam:  Incision: scant drainage right hip. No significant malodor. Appears to be serous Right thigh edema some improvement. Left thigh edema as well. No significant pain with right hip range of motion. Multiple toes on both feet are dark in appearance. Right-hand index finger slightly dusky.  Assessment/Plan:  13 Days Post-Op Procedure(s) (LRB): TOTAL HIP ARTHROPLASTY ANTERIOR APPROACH (Right) Up with therapy bed to chair. May reinforce or change dressing as needed. Vascular consult felt no immediate amputation necessary, but will continue to observe.  Arterial duplex bilateral I will have Dr. Turner Daniels to see patient as well. Will probably remove right hip staples in 2 days.     Alastor Kneale R 12/15/2012, 12:06 PM

## 2012-12-15 NOTE — Progress Notes (Signed)
Occupational Therapy Treatment Patient Details Name: Natalie Kent MRN: [id number] DOB: Aug 20, 1930 Today's Date: 12/15/2012 Time: 4098-1191 OT Time Calculation (min): 39 min  OT Assessment / Plan / Recommendation  History of present illness s/p elective R THA anterior approach, pt now with septic shock, UTI, acute kidney injury.  Pt with dry gangrene of bil feet.   Clinical Impression Pt with limited progress this session, only tolerated sitting EOB for 5 mins while drinking from a glass.  During process pt needed max encouragement to participate as much as possible, however she kept complaining of pain in both of her legs and requesting to lay back down.  Goals downgraded based on current level of needing total assist +2 for bed mobility with min to max assist for sitting balance EOB.  Will likely need some type of orthopedic shoe or hard sole PRAFO as pt cannot currently tolerate gripper socks on her feet.      Follow Up Recommendations  SNF       Equipment Recommendations  None recommended by OT       Frequency Min 2X/week   Progress towards OT Goals Progress towards OT goals: Goals drowngraded-see care plan  Plan Discharge plan remains appropriate    Precautions / Restrictions Precautions Precautions: Fall Precaution Comments: Direct anterior hip no precautions Restrictions Weight Bearing Restrictions: No RLE Weight Bearing: Weight bearing as tolerated   Pertinent Vitals/Pain Pt reporting pain 8/10 on the faces scale in bilateral LEs, O2 96% on room air, and HR in the mid 80's Pain meds brought at beginning of session and pt repositioned in bed as well.   ADL  Grooming: Performed;Supervision/safety;Wash/dry face Where Assessed - Grooming: Supine, head of bed up Transfers/Ambulation Related to ADLs: Did not attempt transfer this session. ADL Comments: Pt currently total assist +2 for transfer from supine to sit EOB.  Once sitting pt initially needed max assist to maintain  balance but by the end of 5 mins pt was able to maintain static sitting with min guard assist.  Pt wanting to lay back down immediately after sitting up stating "I can't do it."  Encouraged pt throughout session and let her drink some water in sitting as well.  Unable to donn griper socks this session secondary to increased bilateral foot pain.  May need  open ended orthopedic shoe or PRAFO for wieightbearing.     OT Goals(current goals can now be found in the care plan section) Acute Rehab OT Goals Patient Stated Goal: Pt did not state this session. OT Goal Formulation: With patient Time For Goal Achievement: 12/29/12 Potential to Achieve Goals: Fair  Visit Information  Last OT Received On: 12/15/12 Assistance Needed: +2 History of Present Illness: s/p elective R THA anterior approach, pt now with septic shock, UTI, acute kidney injury.  Pt with dry gangrene of bil feet.          Cognition  Cognition Arousal/Alertness: Awake/alert Behavior During Therapy: Anxious Overall Cognitive Status: Impaired/Different from baseline Area of Impairment: Memory;Safety/judgement;Awareness Current Attention Level: Focused Following Commands: Follows one step commands with increased time Safety/Judgement: Decreased awareness of deficits Awareness: Intellectual General Comments: Pt with decreased awareness of why she needs to participate in as much therapy as possible.    Mobility  Bed Mobility Bed Mobility: Supine to Sit;Sit to Supine Supine to Sit: 1: +2 Total assist Supine to Sit: Patient Percentage: 10% Sitting - Scoot to Edge of Bed: 1: +1 Total assist Sitting - Scoot to Edge of Bed: Patient  Percentage: 10% Sit to Supine: 1: +2 Total assist Sit to Supine: Patient Percentage: 10% Details for Bed Mobility Assistance: Pt requires assistance with all aspects of bed mobility including supporting and moving the LLE.       Balance Balance Balance Assessed: Yes Static Sitting Balance Static  Sitting - Balance Support: Bilateral upper extremity supported;Feet unsupported Static Sitting - Level of Assistance: 2: Max assist Static Sitting - Comment/# of Minutes: Pt able to sit EOB for 5 mins.  Initially required max assist for sitting and progressed to min assist.   End of Session OT - End of Session Activity Tolerance: Patient limited by pain;Patient limited by fatigue Patient left: in bed Nurse Communication: Mobility status     Velton Roselle OTR/L Pager number [phone] 12/15/2012, 3:27 PM

## 2012-12-15 NOTE — Progress Notes (Signed)
Speech Language Pathology Dysphagia Treatment Patient Details Name: Natalie Kent MRN: [id number] DOB: March 08, 1931 Today's Date: 12/15/2012 Time: 6578-4696 SLP Time Calculation (min): 10 min  Assessment / Plan / Recommendation Clinical Impression  Pt seen for skilled dysphagia therapy to educate family re: diet modifications and importance of swallow precautions, feeding pt only when she is fully alert.  Daughter Natalie Kent states pt is a picky eater and had poor intake even prior to admission.   Daughter states she is going to get her mother chicken salad today - educated her to need to puree it for maximal safety.  Further educated daughter to signs of aspiration and need for pt to take rest breaks if dyspnea, given her respiratory status and overt deconditioning.  RN reports pt has significant desire for water.  Episodic aspiraiton risk is high given weakness, respiratory status.   Would rec to consider palliative referral to help establish goals due to poor intake even PTA, 3 admits in six months, and chronicity of medical issues.  SLP to follow for mitigation strategies.      Diet Recommendation  Continue with Current Diet: Dysphagia 1 (puree);Thin liquid    SLP Plan Continue with current plan of care   Pertinent Vitals/Pain Low grade temp, decreased, easily dyspenic   Swallowing Goals  SLP Swallowing Goals Patient will consume recommended diet without observed clinical signs of aspiration with: Total assistance Patient will utilize recommended strategies during swallow to increase swallowing safety with: Total assistance Swallow Study Goal #2 - Progress: Progressing toward goal  General Behavior/Cognition: Lethargic;Decreased sustained attention Oral Cavity - Dentition: Dentures, bottom (top dentures not in place due to ill fitting)    Dysphagia Treatment Treatment focused on: Patient/family/caregiver education Family/Caregiver Educated: daughter Natalie Kent Patient observed directly  with PO's: No Reason PO's not observed: Lethargic Feeding: Total assist   GO     Donavan Burnet, MS [facility] SLP [phone]

## 2012-12-16 ENCOUNTER — Inpatient Hospital Stay (HOSPITAL_COMMUNITY): Payer: Medicare Other

## 2012-12-16 DIAGNOSIS — J9600 Acute respiratory failure, unspecified whether with hypoxia or hypercapnia: Secondary | ICD-10-CM

## 2012-12-16 DIAGNOSIS — R9430 Abnormal result of cardiovascular function study, unspecified: Secondary | ICD-10-CM

## 2012-12-16 DIAGNOSIS — A419 Sepsis, unspecified organism: Secondary | ICD-10-CM

## 2012-12-16 LAB — CBC
HCT: 25.3 % — ABNORMAL LOW (ref 36.0–46.0)
Hemoglobin: 8.1 g/dL — ABNORMAL LOW (ref 12.0–15.0)
MCV: 89.7 fL (ref 78.0–100.0)
MCV: 89.1 fL (ref 78.0–100.0)
Platelets: 358 10*3/uL (ref 150–400)
RDW: 17 % — ABNORMAL HIGH (ref 11.5–15.5)
WBC: 7.9 10*3/uL (ref 4.0–10.5)
WBC: 7.5 10*3/uL (ref 4.0–10.5)

## 2012-12-16 LAB — COMPREHENSIVE METABOLIC PANEL
Alkaline Phosphatase: 83 U/L (ref 39–117)
BUN: 33 mg/dL — ABNORMAL HIGH (ref 6–23)
Chloride: 110 mEq/L (ref 96–112)
GFR calc Af Amer: 34 mL/min — ABNORMAL LOW (ref >90)
GFR calc non Af Amer: 30 mL/min — ABNORMAL LOW (ref >90)
Glucose, Bld: 72 mg/dL (ref 70–99)
Potassium: 3.6 mEq/L (ref 3.5–5.1)
Total Bilirubin: 0.5 mg/dL (ref 0.3–1.2)

## 2012-12-16 LAB — BLOOD GAS, ARTERIAL
FIO2: 0.28 %
Patient temperature: 98.6
TCO2: 16.6 mmol/L (ref 0–100)
pH, Arterial: 7.348 — ABNORMAL LOW (ref 7.350–7.450)

## 2012-12-16 LAB — URINE MICROSCOPIC-ADD ON

## 2012-12-16 LAB — URINALYSIS, ROUTINE W REFLEX MICROSCOPIC
Nitrite: POSITIVE — AB
Specific Gravity, Urine: 1.03 (ref 1.005–1.030)
Urobilinogen, UA: 1 mg/dL (ref 0.0–1.0)

## 2012-12-16 LAB — PROCALCITONIN: Procalcitonin: 1.02 ng/mL

## 2012-12-16 LAB — TYPE AND SCREEN: Antibody Screen: NEGATIVE

## 2012-12-16 LAB — LACTIC ACID, PLASMA
Lactic Acid, Venous: 1.1 mmol/L (ref 0.5–2.2)
Lactic Acid, Venous: 1.1 mmol/L (ref 0.5–2.2)

## 2012-12-16 MED ORDER — ALBUTEROL SULFATE (5 MG/ML) 0.5% IN NEBU
2.5000 mg | INHALATION_SOLUTION | Freq: Four times a day (QID) | RESPIRATORY_TRACT | Status: DC
  Administered 2012-12-16: 2.5 mg via RESPIRATORY_TRACT
  Filled 2012-12-16: qty 0.5

## 2012-12-16 MED ORDER — SODIUM CHLORIDE 0.9 % IV BOLUS (SEPSIS)
500.0000 mL | Freq: Once | INTRAVENOUS | Status: AC
  Administered 2012-12-16: 500 mL via INTRAVENOUS

## 2012-12-16 MED ORDER — CHLORHEXIDINE GLUCONATE 0.12 % MT SOLN
15.0000 mL | Freq: Two times a day (BID) | OROMUCOSAL | Status: DC
  Administered 2012-12-16 – 2012-12-21 (×12): 15 mL via OROMUCOSAL
  Filled 2012-12-16 (×13): qty 15

## 2012-12-16 MED ORDER — DEXTROSE 50 % IV SOLN
1.0000 | Freq: Once | INTRAVENOUS | Status: AC
  Administered 2012-12-16: 1 via INTRAVENOUS

## 2012-12-16 MED ORDER — SODIUM CHLORIDE 0.9 % IV BOLUS (SEPSIS): 1000.0000 mL | INTRAVENOUS | Status: DC | PRN

## 2012-12-16 MED ORDER — ALBUTEROL SULFATE (5 MG/ML) 0.5% IN NEBU: 2.5000 mg | INHALATION_SOLUTION | RESPIRATORY_TRACT | Status: DC | PRN

## 2012-12-16 MED ORDER — PHENYLEPHRINE HCL 10 MG/ML IJ SOLN
20.0000 ug/min | INTRAVENOUS | Status: DC
  Filled 2012-12-16: qty 1

## 2012-12-16 MED ORDER — SODIUM CHLORIDE 0.9 % IV BOLUS (SEPSIS)
500.0000 mL | Freq: Once | INTRAVENOUS | Status: AC
Start: 2012-12-17 — End: 2012-12-17
  Administered 2012-12-17: 500 mL via INTRAVENOUS

## 2012-12-16 MED ORDER — VANCOMYCIN HCL 10 G IV SOLR
1250.0000 mg | INTRAVENOUS | Status: DC
  Administered 2012-12-16 – 2012-12-17 (×2): 1250 mg via INTRAVENOUS
  Filled 2012-12-16 (×3): qty 1250

## 2012-12-16 MED ORDER — DEXTROSE 50 % IV SOLN
INTRAVENOUS | Status: AC
  Filled 2012-12-16: qty 50

## 2012-12-16 MED ORDER — ENSURE COMPLETE PO LIQD
237.0000 mL | Freq: Three times a day (TID) | ORAL | Status: DC
  Administered 2012-12-16 (×2): 237 mL via ORAL
  Administered 2012-12-16: 100 mL via ORAL

## 2012-12-16 MED ORDER — SODIUM CHLORIDE 0.9 % IV BOLUS (SEPSIS): 500.0000 mL | INTRAVENOUS | Status: DC | PRN

## 2012-12-16 MED ORDER — VENLAFAXINE HCL ER 150 MG PO CP24
150.0000 mg | ORAL_CAPSULE | Freq: Every morning | ORAL | Status: DC
  Administered 2012-12-16 – 2012-12-17 (×2): 150 mg via ORAL
  Filled 2012-12-16 (×3): qty 1

## 2012-12-16 MED ORDER — HEPARIN (PORCINE) IN NACL 100-0.45 UNIT/ML-% IJ SOLN
2400.0000 [IU]/h | INTRAMUSCULAR | Status: DC
  Administered 2012-12-16 (×2): 2600 [IU]/h via INTRAVENOUS
  Administered 2012-12-17 (×2): 2400 [IU]/h via INTRAVENOUS
  Administered 2012-12-17: 2600 [IU]/h via INTRAVENOUS
  Filled 2012-12-16 (×7): qty 250

## 2012-12-16 MED ORDER — OXYCODONE HCL 5 MG PO TABS: 5.0000 mg | ORAL_TABLET | Freq: Four times a day (QID) | ORAL | Status: DC | PRN

## 2012-12-16 MED ORDER — SODIUM CHLORIDE 0.9 % IV SOLN
INTRAVENOUS | Status: DC
  Administered 2012-12-16: 19:00:00 via INTRAVENOUS
  Administered 2012-12-17: 20 mL/h via INTRAVENOUS

## 2012-12-16 MED ORDER — BIOTENE DRY MOUTH MT LIQD
15.0000 mL | Freq: Two times a day (BID) | OROMUCOSAL | Status: DC
  Administered 2012-12-16 – 2012-12-21 (×11): 15 mL via OROMUCOSAL

## 2012-12-16 MED ORDER — SODIUM CHLORIDE 0.9 % IJ SOLN
INTRAMUSCULAR | Status: AC
  Filled 2012-12-16: qty 20

## 2012-12-16 MED ORDER — HYDROCORTISONE SOD SUCCINATE 100 MG IJ SOLR
50.0000 mg | Freq: Four times a day (QID) | INTRAMUSCULAR | Status: DC
  Administered 2012-12-16: 21:00:00 via INTRAVENOUS
  Administered 2012-12-17 – 2012-12-22 (×21): 50 mg via INTRAVENOUS
  Filled 2012-12-16 (×26): qty 1

## 2012-12-16 MED ORDER — PREGABALIN 25 MG PO CAPS
25.0000 mg | ORAL_CAPSULE | Freq: Two times a day (BID) | ORAL | Status: DC
  Administered 2012-12-16 – 2012-12-17 (×4): 25 mg via ORAL
  Filled 2012-12-16 (×4): qty 1

## 2012-12-16 MED ORDER — PIPERACILLIN-TAZOBACTAM 3.375 G IVPB
3.3750 g | Freq: Three times a day (TID) | INTRAVENOUS | Status: DC
  Administered 2012-12-16 – 2012-12-20 (×11): 3.375 g via INTRAVENOUS
  Filled 2012-12-16 (×13): qty 50

## 2012-12-16 NOTE — Progress Notes (Addendum)
ANTIBIOTIC CONSULT NOTE - INITIAL  Pharmacy Consult for Vancomycin and Zosyn Indication:  Bilateral leg infection /sepsis  Allergies  Allergen Reactions  . Solarcaine Aloe Extra (Lidocaine Hcl)   . Septra (Bactrim)   . Cephalexin Rash    Patient Measurements: Height: 5\' 6"  (167.6 cm) Weight: 213 lb 13.5 oz (97 kg) IBW/kg (Calculated) : 59.3 Adjusted Body Weight:  74 kg  Vital Signs: Temp: 98.3 F (36.8 C) (07/22 1519) Temp src: Oral (07/22 1519) BP: 74/32 mmHg (07/22 1700) Pulse Rate: 93 (07/22 1700) Intake/Output from previous day: 07/21 0701 - 07/22 0700 In: 2167.6 [P.O.:1040; I.V.:827.6; IV Piggyback:300] Out: 620 [Urine:620] Intake/Output from this shift: Total I/O In: 150 [I.V.:150] Out: 170 [Urine:170]  Labs:  Recent Labs  12/14/12 0300 12/15/12 0400 12/16/12 0420 12/16/12 1730  WBC 9.3 7.4 7.9 7.5  HGB 9.0* 8.2* 8.1* 7.6*  PLT 281 316 381 358  CREATININE 1.24* 1.35* 1.60*  --    Estimated Creatinine Clearance: 33.5 ml/min (by C-G formula based on Cr of 1.6). No results found for this basename: VANCOTROUGH, Leodis Binet, VANCORANDOM, GENTTROUGH, GENTPEAK, GENTRANDOM, TOBRATROUGH, TOBRAPEAK, TOBRARND, AMIKACINPEAK, AMIKACINTROU, AMIKACIN,  in the last 72 hours   Microbiology: Recent Results (from the past 720 hour(s))  SURGICAL PCR SCREEN     Status: Abnormal   Collection Time    11/25/12  2:37 PM      Result Value Range Status   MRSA, PCR POSITIVE (*) NEGATIVE Final   Staphylococcus aureus POSITIVE (*) NEGATIVE Final   Comment:            The Xpert SA Assay (FDA     approved for NASAL specimens     in patients over 61 years of age),     is one component of     a comprehensive surveillance     program.  Test performance has     been validated by [employer] for patients greater     than or equal to 103 year old.     It is not intended     to diagnose infection nor to     guide or monitor treatment.  URINE CULTURE     Status: None    Collection Time    11/25/12  2:37 PM      Result Value Range Status   Specimen Description URINE, CLEAN CATCH   Final   Special Requests NONE   Final   Culture  Setup Time 11/25/2012 15:39   Final   Colony Count 80,000 COLONIES/ML   Final   Culture     Final   Value: GROUP B STREP(S.AGALACTIAE)ISOLATED     Note: TESTING AGAINST S. AGALACTIAE NOT ROUTINELY PERFORMED DUE TO PREDICTABILITY OF AMP/PEN/VAN SUSCEPTIBILITY.   Report Status 11/26/2012 FINAL   Final  URINE CULTURE     Status: None   Collection Time    12/03/12 11:16 AM      Result Value Range Status   Specimen Description URINE, RANDOM   Final   Special Requests NONE   Final   Culture  Setup Time 12/03/2012 17:13   Final   Colony Count NO GROWTH   Final   Culture NO GROWTH   Final   Report Status 12/04/2012 FINAL   Final  CULTURE, BLOOD (ROUTINE X 2)     Status: None   Collection Time    12/03/12  4:40 PM      Result Value Range Status   Specimen Description BLOOD LEFT  ARM   Final   Special Requests BOTTLES DRAWN AEROBIC ONLY 5CC   Final   Culture  Setup Time 12/04/2012 00:10   Final   Culture NO GROWTH 5 DAYS   Final   Report Status 12/10/2012 FINAL   Final  CULTURE, BLOOD (ROUTINE X 2)     Status: None   Collection Time    12/03/12  4:45 PM      Result Value Range Status   Specimen Description BLOOD LEFT HAND   Final   Special Requests BOTTLES DRAWN AEROBIC ONLY 5CC   Final   Culture  Setup Time 12/04/2012 00:11   Final   Culture NO GROWTH 5 DAYS   Final   Report Status 12/10/2012 FINAL   Final  CULTURE, BAL-QUANTITATIVE     Status: None   Collection Time    12/05/12  3:06 PM      Result Value Range Status   Specimen Description BRONCHIAL ALVEOLAR LAVAGE   Final   Special Requests NONE   Final   Gram Stain     Final   Value: NO WBC SEEN     NO SQUAMOUS EPITHELIAL CELLS SEEN     NO ORGANISMS SEEN   Colony Count 25,000 COLONIES/ML   Final   Culture YEAST   Final   Report Status 12/07/2012 FINAL   Final   CULTURE, BLOOD (ROUTINE X 2)     Status: None   Collection Time    12/13/12  2:27 PM      Result Value Range Status   Specimen Description BLOOD RIGHT HAND   Final   Special Requests BOTTLES DRAWN AEROBIC ONLY 3CC   Final   Culture  Setup Time 12/13/2012 20:32   Final   Culture     Final   Value:        BLOOD CULTURE RECEIVED NO GROWTH TO DATE CULTURE WILL BE HELD FOR 5 DAYS BEFORE ISSUING A FINAL NEGATIVE REPORT   Report Status PENDING   Incomplete  CULTURE, BLOOD (ROUTINE X 2)     Status: None   Collection Time    12/13/12  2:31 PM      Result Value Range Status   Specimen Description BLOOD BLOOD LEFT FOREARM   Final   Special Requests BOTTLES DRAWN AEROBIC AND ANAEROBIC 8CC   Final   Culture  Setup Time 12/13/2012 20:32   Final   Culture     Final   Value:        BLOOD CULTURE RECEIVED NO GROWTH TO DATE CULTURE WILL BE HELD FOR 5 DAYS BEFORE ISSUING A FINAL NEGATIVE REPORT   Report Status PENDING   Incomplete  CLOSTRIDIUM DIFFICILE BY PCR     Status: None   Collection Time    12/13/12  7:44 PM      Result Value Range Status   C difficile by pcr NEGATIVE  NEGATIVE Final    Medical History: Past Medical History  Diagnosis Date  . DVT (deep venous thrombosis)   . Asthma   . Arthritis   . Hypertension   . Right rib fracture   . Fibromyalgia   . Mental disorder   . Anxiety   . Depression   . Pneumonia     hx  . Peripheral vascular disease 70's    groin blood clot  . GERD (gastroesophageal reflux disease)   . H/O hiatal hernia   . Anemia     hx  . Dysrhythmia     "  irregular" heart rate --- history of sinus arrhythmia  . Stroke 03/2005     Assessment: Resuming IV vancomycin and starting zosyn in this 79 y.o female for treatment of Bilateral leg infection /sepsis.  SCr has increased from 1.35 to 1.6.  CrCL ~ 34 ml/min.  WBC 7.5, afebrile, Tm 100.8.  Last vancomycin dose 1250 mg given yesterday 7/21 @13 :30.   CULTURES:  7/09 Urine >> negative  7/09 Blood >> no  growth  7/09 Sputum >> Yeast  7/22 blood>>>  7/22 urine>>>  7/22 sputum>>>   Goal of Therapy:  Vancomycin trough level 15-20 mcg/ml  Plan:  Resume Vancomycin 1250 mg IV q24h.  Zosyn 3.375 g IV q8h (4h infusion) Monitor clinical status, renal function and culture data daily.  Check steady state vancomycin trough if appropriate.  Noah Delaine, RPh Clinical Pharmacist Pager: [phone] 12/16/2012   ,6:22 PM

## 2012-12-16 NOTE — Progress Notes (Signed)
ANTICOAGULATION CONSULT NOTE - Follow Up Consult  Pharmacy Consult for heparin Indication: atrial fibrillation and ischemia of feet/hand  Labs:  Recent Labs  12/13/12 0430  12/13/12 1450  12/14/12 0300  12/15/12 0400 12/15/12 1300 12/16/12 0010  HGB 7.1*  --  9.8*  --  9.0*  --  8.2*  --   --   HCT 21.5*  --  29.0*  --  26.6*  --  25.2*  --   --   PLT 236  --  282  --  281  --  316  --   --   HEPARINUNFRC  --   < >  --   < > 0.21*  < > 0.26* 0.20* 0.16*  CREATININE 1.21*  --   --   --  1.24*  --  1.35*  --   --   < > = values in this interval not displayed.   Assessment: 79yo female remains subtherapeutic on heparin with lower level despite rate increases, no issue w/ gtt.  Goal of Therapy:  Heparin level 0.3-0.5 units/ml   Plan:  Will increase heparin gtt by 2 units/kg/hr to 2500 units/hr and check level in 8hr.  Vernard Gambles, PharmD, BCPS  12/16/2012,12:57 AM

## 2012-12-16 NOTE — Progress Notes (Signed)
Have been unable to maintain blood pressure's after patient received  500 cc bolus of normal saline. Notified MD who had CCM come and check on the patient at that time orders placed to transfer to 2100. Phone report called to [facility]. Family present and aware of the transfer.

## 2012-12-16 NOTE — Progress Notes (Signed)
Discussed hospital course, reason for transfer back to ICU and ongoing treatment with multiple family members.  Family expresses concerns about continuity of care, frequent up and downgrading, and "using too much pain medications".  Per family request agreed to keep patient on PCCM service until discharge.  The consensus is that the patient / family would want full medical care, but without aggressive interventions such as CPR, intubation, defibrillation, vasopressors.  Code status updated to " partial ".  eMD notified.  Will keep in ICU overnight.

## 2012-12-16 NOTE — Progress Notes (Signed)
Subjective: 14 Days Post-Op Procedure(s) (LRB): TOTAL HIP ARTHROPLASTY ANTERIOR APPROACH (Right) Dry gangrene toes Activity level:  Bed to chair transfers. May be weightbearing right hip. Diet tolerance:  Eating light pured diet Voiding:  Foley catheter Patient reports pain as 2 on 0-10 scale.    Objective: Vital signs in last 24 hours: Temp:  [98.6 F (37 C)-100.8 F (38.2 C)] 100 F (37.8 C) (07/22 1100) Pulse Rate:  [83-97] 96 (07/22 1100) Resp:  [13-24] 24 (07/22 1100) BP: (102-149)/(32-64) 120/45 mmHg (07/22 1100) SpO2:  [94 %-100 %] 100 % (07/22 1100) Weight:  [97 kg (213 lb 13.5 oz)] 97 kg (213 lb 13.5 oz) (07/22 0400)  Labs:  Recent Labs  12/13/12 1450 12/14/12 0300 12/15/12 0400 12/16/12 0420  HGB 9.8* 9.0* 8.2* 8.1*    Recent Labs  12/15/12 0400 12/16/12 0420  WBC 7.4 7.9  RBC 2.83* 2.82*  HCT 25.2* 25.3*  PLT 316 381    Recent Labs  12/15/12 0400 12/16/12 0420  NA 141 139  K 3.5 3.6  CL 110 110  CO2 21 19  BUN 32* 33*  CREATININE 1.35* 1.60*  GLUCOSE 74 72  CALCIUM 8.5 8.6   No results found for this basename: LABPT, INR,  in the last 72 hours  Physical Exam:  Compartment soft Right hip with clear drainage no malodor. Range of motion and relatively pain-free. Right and left thigh edema seems better today Multiple toes on both feet remain dark but no sign of active infection.  Assessment/Plan:  14 Days Post-Op Procedure(s) (LRB): TOTAL HIP ARTHROPLASTY ANTERIOR APPROACH (Right) Up with therapy bed to chair transfers. I spoke to Ms. Ward from occupational therapy and she will try splinting feet to Prevent problems. Dr.Rowan recommends observation of her feet for demarcation as long as continue to show no sign of active infection.. Expect  surgery at a later date. Please continue to either reinforce or change dressing as needed.    Jimmye Wisnieski R 12/16/2012, 12:10 PM

## 2012-12-16 NOTE — Clinical Social Work Note (Signed)
Clinical Social Worker provided patient and daughters, at bed side, list of responses received from SNF search. Daughters did express interest in [facility]. Daughter, Edison Simon, reported that she will speak with her other siblings regarding choices. CSW will follow up with daughters regarding confirmation.   Rozetta Nunnery MSW, [employer] [phone]

## 2012-12-16 NOTE — Progress Notes (Signed)
Orthopedic Tech Progress Note Patient Details:  Natalie Kent 24-Sep-1930 [id number]  Ortho Devices Type of Ortho Device: Postop shoe/boot Ortho Device/Splint Location: bilateral prafo boots Ortho Device/Splint Interventions: Application   Cammer, Mickie Bail 12/16/2012, 2:13 PM

## 2012-12-16 NOTE — Progress Notes (Signed)
ANTICOAGULATION CONSULT NOTE - Follow Up Consult  Pharmacy Consult for Heparin Indication: atrial fibrillation and ischemia of feet/hand  Allergies  Allergen Reactions  . Solarcaine Aloe Extra (Lidocaine Hcl)   . Septra (Bactrim)   . Cephalexin Rash    Patient Measurements: Height: 5\' 6"  (167.6 cm) Weight: 213 lb 13.5 oz (97 kg) IBW/kg (Calculated) : 59.3 Heparin Dosing Weight: 80kg  Vital Signs: Temp: 100 F (37.8 C) (07/22 1100) Temp src: Core (Comment) (07/22 0400) BP: 120/45 mmHg (07/22 1100) Pulse Rate: 96 (07/22 1100)  Labs:  Recent Labs  12/14/12 0300  12/15/12 0400 12/15/12 1300 12/16/12 0010 12/16/12 0420 12/16/12 0903  HGB 9.0*  --  8.2*  --   --  8.1*  --   HCT 26.6*  --  25.2*  --   --  25.3*  --   PLT 281  --  316  --   --  381  --   HEPARINUNFRC 0.21*  < > 0.26* 0.20* 0.16*  --  0.32  CREATININE 1.24*  --  1.35*  --   --  1.60*  --   < > = values in this interval not displayed.  Estimated Creatinine Clearance: 33.5 ml/min (by C-G formula based on Cr of 1.6).   Medications:  Heparin @ 2500 units/hr  Assessment: 81yom s/p right THA 7/8 who developed septic shock. On 7/17 [facility] was consulted due discolored toes bilaterally c/w emoblic phenomena vs pressor induced ischemia. She was started on heparin. Today she continues on heparin with therapeutic heparin level but on the low end. Hgb is low at 8.1 - has received 5 units total of PRBCs thus far this admission. No bleeding reported.  Goal of Therapy:  Heparin level 0.3-0.5 units/ml Monitor platelets by anticoagulation protocol: Yes   Plan:  1) Increase heparin to 2600 units/hr to keep therapeutic 2) Follow up heparin level, CBC in AM  [name] 12/16/2012,1:08 PM

## 2012-12-16 NOTE — Progress Notes (Signed)
Occupational Therapy Treatment Patient Details Name: Natalie Kent MRN: [id number] DOB: 1930-12-29 Today's Date: 12/16/2012 Time: 1308-6578 OT Time Calculation (min): 18 min  OT Assessment / Plan / Recommendation  History of present illness s/p elective R THA anterior approach, pt now with septic shock, UTI, acute kidney injury.  Pt with dry gangrene of bil feet/toes   Clinical Impression Functional decline. Necessary to splint B feet to prevent further loss of ROM at ankles and facilitate functional ambulation when appropriate.   OT comments  Discussed splinting options with Lindwood Qua. At this time will order B PRAFO boots and adapt as needed. This will be beneficial in positioning ankle as well as using side bar to decrease pt from exaggerated internal rotation, which is her current position. Discussed positioning with nsg. Will order PRAFO boots and monitor closely. For ambulation/standing, pt will need possible [employer] shoes to relieve pressure from toes. Will continue to assess as pt progresses.  Follow Up Recommendations  SNF    Barriers to Discharge       Equipment Recommendations  None recommended by OT    Recommendations for Other Services    Frequency Min 2X/week   Progress towards OT Goals Progress towards OT goals: Progressing toward goals  Plan Discharge plan remains appropriate    Precautions / Restrictions Precautions Precautions: Fall Precaution Comments: Direct anterior hip no precautions; gangrnous toes - very painful Restrictions Weight Bearing Restrictions: No RLE Weight Bearing: Weight bearing as tolerated   Pertinent Vitals/Pain C/o pain B feet. nsg aware    ADL  ADL Comments: focus of sessino on assessingfor B foot splints    OT Diagnosis:    OT Problem List:   OT Treatment Interventions:     OT Goals(current goals can now be found in the care plan section) Acute Rehab OT Goals Patient Stated Goal: Pt did not state this session. OT Goal  Formulation: With patient Time For Goal Achievement: 12/29/12 Potential to Achieve Goals: Fair ADL Goals Pt Will Perform Grooming: with supervision;sitting Pt Will Transfer to Toilet: stand pivot transfer;with +2 assist;bedside commode Pt Will Perform Toileting - Clothing Manipulation and hygiene: with 2+ total assist;sit to/from stand Additional ADL Goal #1: Pt will transfer supine to sit EOB with max assist in preparation for selfcare tasks. Additional ADL Goal #2: Pt will tolerate sitting EOB with no more than min assist, for 20 mins.  Visit Information  Last OT Received On: 12/16/12 Assistance Needed: +2 History of Present Illness: s/p elective R THA anterior approach, pt now with septic shock, UTI, acute kidney injury.  Pt with dry gangrene of bil feet.    Subjective Data      Prior Functioning       Cognition  Cognition Arousal/Alertness: Awake/alert Behavior During Therapy: Agitated;Restless Overall Cognitive Status: Impaired/Different from baseline Area of Impairment: Orientation;Attention;Memory;Following commands;Safety/judgement;Awareness;Problem solving Orientation Level: Disoriented to;Time Current Attention Level: Focused Memory: Decreased recall of precautions;Decreased short-term memory Following Commands: Follows one step commands with increased time Safety/Judgement: Decreased awareness of safety;Decreased awareness of deficits Awareness: Intellectual Problem Solving: Slow processing;Decreased initiation;Difficulty sequencing;Requires verbal cues;Requires tactile cues    Mobility  Bed Mobility Details for Bed Mobility Assistance: toal A +2 Transfers Details for Transfer Assistance: not addressed today    Exercises      Balance     End of Session OT - End of Session Activity Tolerance: Patient limited by pain Patient left: in bed;with nursing/sitter in room Nurse Communication: Mobility status;Other (comment) (need for B foot splints  and improved B LE  positionning)  GO     Pieter Fooks,HILLARY 12/16/2012, 11:56 AM Luisa Dago, OTR/L  [phone] 12/16/2012

## 2012-12-16 NOTE — Significant Event (Signed)
Received patient from 3300, report from Viona Gilmore.  Patient oriented to ICU environment, Foley cath placed, IVF started and central line accessed.  CVP started with reading of 8.  Patient having audible expiratory wheezing with non-productive cough. Pain to bil LE, toes black, pulses 1+ palpable.  Glucose check low, recheck 1 amp D50 administered with glucose increased to 111.  Will continue to monitor closely, family awaiting to come to see patient.

## 2012-12-16 NOTE — Progress Notes (Signed)
Patient ID: Natalie Kent, female   DOB: 1930-11-26, 79 y.o.   MRN: [id number] Subjective: Status post-right anterior total hip Arthroplasty by Dr. Jerl Santos on December 09, 2012, went on to develop low blood pressure and sepsis, a few days later.  Admitted to the intensive care unit required vasopressor support and ventilator support for a number of days.  Initially had elevated white counts and the 17-20,000 range, now down to 11-7998.  Over the last few days.  Patient is now off the ventilator and reports minimal right hip pain.  Initially had some serous drainage from the wound, And a small blood blister that is now dried. Has had persistent lower extremity edema since the sepsis and has a blister over the lateral aspect of the right hip that has drained serous fluid for the last week or so.  Patient's hip has not been irritable to rotation or axial loading.  A second issue, his bilateral toe ischemia out near the distal aspect of the toes, probably secondary to the septic episode with pressors, although it is possible it could be from emboli.  The patient has been evaluated by Dr. Johny Drilling of vascular surgery, who feels that the patient has dry gangrene and no immediate amputation is needed at this time.  Objective: the patient's right hip anterior wound is healing and probably ready for staple removal and a day or 2.  The blood blister that I observed last week is dried, there is still edema to both lower extremities and the training blister on the lateral aspect of the right hip that is relatively soft with minimal rubor.  The bilateral distal toe ischemia is stable extending from the DIP joint distally with some mummification of the left third toe.  There are no draining wounds of the toe and this does appear to be dry gangrene.  The patient does report that her right hip pain is minimal and continues to improve.  Assessment & Plan: 1. At this time I do not believe she has an infectious process in the right hip.  The  patient will probably be getting a CT scan as part of an arterial study in the next few days, this will include the pelvis and allow [place] to look for any evidence of fluid collections or abscesses around the right hip. I agree with staple removal, and a 1-2 days and she can bear weight on the right lower extremity.  2. Regarding the Distal toe ischemia, I concur that this is dry gangrene and does not need to be addressed until the patient is medically stable and amputation can be done on an elective basis.

## 2012-12-16 NOTE — Progress Notes (Signed)
Occupational Therapy Treatment Patient Details Name: Natalie Kent MRN: [id number] DOB: 24-Mar-1931 Today's Date: 12/16/2012 Time: 4401-0272 OT Time Calculation (min): 24 min  OT Assessment / Plan / Recommendation  History of present illness s/p elective R THA anterior approach, pt now with septic shock, UTI, acute kidney injury.  Pt with dry gangrene of bil feet.   Clinical Impression SNF  Rec palliative care consult for pain management.   OT comments  Pt seen to fit B PRAFO boots with ortho tech. Able to achieve good positioning. Educated nsg on position and wearing time of B splints. Pt to wear for 2 hrs on and then check for skin issues. Pt can have splints off for an hour to rest,. Then reapply. Stop adjusted on splint to prevent exaggerated IR of R hip. Educated family on purpose of splint and family verbalized understanding. Will follow daily for splint checks, in addition to therapy to address previious treatment plan.  Follow Up Recommendations  SNF    Barriers to Discharge       Equipment Recommendations  None recommended by OT    Recommendations for Other Services    Frequency Min 2X/week   Progress towards OT Goals Progress towards OT goals: Progressing toward goals  Plan Discharge plan remains appropriate    Precautions / Restrictions Precautions Precautions: Fall Precaution Comments: Direct anterior hip no precautions; gangrnous toes - very painful Required Braces or Orthoses: Other Brace/Splint (B PRAFO Boots) Restrictions Weight Bearing Restrictions: No RLE Weight Bearing: Weight bearing as tolerated   Pertinent Vitals/Pain C/o pain    ADL  ADL Comments: seen for fitting of PRAFO boots    OT Diagnosis:    OT Problem List:   OT Treatment Interventions:     OT Goals(current goals can now be found in the care plan section) Acute Rehab OT Goals Patient Stated Goal: Pt did not state this session. OT Goal Formulation: With patient Time For Goal  Achievement: 12/29/12 Potential to Achieve Goals: Fair ADL Goals Pt Will Perform Grooming: with supervision;sitting Pt Will Transfer to Toilet: stand pivot transfer;with +2 assist;bedside commode Pt Will Perform Toileting - Clothing Manipulation and hygiene: with 2+ total assist;sit to/from stand Additional ADL Goal #1: Pt will transfer supine to sit EOB with max assist in preparation for selfcare tasks. Additional ADL Goal #2: Pt will tolerate sitting EOB with no more than min assist, for 20 mins.  Visit Information  Last OT Received On: 12/16/12 Assistance Needed: +2 History of Present Illness: s/p elective R THA anterior approach, pt now with septic shock, UTI, acute kidney injury.  Pt with dry gangrene of bil feet.    Subjective Data      Prior Functioning       Cognition  Cognition Arousal/Alertness: Awake/alert Behavior During Therapy: Agitated;Restless Overall Cognitive Status: Impaired/Different from baseline Area of Impairment: Orientation;Attention;Memory;Following commands;Safety/judgement;Awareness;Problem solving Orientation Level: Disoriented to;Time Current Attention Level: Focused Memory: Decreased recall of precautions;Decreased short-term memory Following Commands: Follows one step commands with increased time Safety/Judgement: Decreased awareness of safety;Decreased awareness of deficits Awareness: Intellectual Problem Solving: Slow processing;Decreased initiation;Difficulty sequencing;Requires verbal cues;Requires tactile cues    Mobility  Bed Mobility Details for Bed Mobility Assistance: toal A +2 Transfers Details for Transfer Assistance: not addressed today    Exercises      Balance     End of Session OT - End of Session Activity Tolerance: Patient tolerated treatment well Patient left: in bed;with call bell/phone within reach;with family/visitor present Nurse Communication: Mobility status;Other (  comment) (b splints and positioning)  GO      Natalie Kent,HILLARY 12/16/2012, 12:46 PM [facility], OTR/L  [phone] 12/16/2012

## 2012-12-16 NOTE — Progress Notes (Addendum)
NUTRITION FOLLOW UP  Intervention:   1.  Food/Beverage; encourage intake of meals and beverages as able.  2.  Supplements; Ensure Complete po TID, each supplement provides 350 kcal and 13 grams of protein.   Nutrition Dx:   Inadequate oral intake, ongoing.  Monitor:   1.  Enteral nutrition; initiation with tolerance.  Pt to meet >/=90% estimated needs with nutrition support.   Not met, pt TF's held due to poor tolerance.  2.  Wt/wt change; monitor trends.  Ongoing. 3.  Food/Beverage; pt meeting >/=90% estimated needs with tolerance once diet appropriate and advanced. 4.  Parenteral nutrition; initiation with tolerance.  Management per PharmD.   Assessment:   Pt presented for hip surgery.  Experience worsening abdominal pain and septic shock r/t pneumonia requiring intubation. ,Pt has since been extubated, NGT has been removed as pt began having bowel movements with colace BID.  Pt has initiated Dysphagia 1 diet with thin liquids.  Pt tolerating well, however intake is suboptimal at 25-40% of meals.   Pt is alert at time of visit, but is somewhat confused. Pt with bilateral digital ischemia right hand and bilateral feet.   Height: Ht Readings from Last 1 Encounters:  12/04/12 5\' 6"  (1.676 m)    Weight Status:   Wt Readings from Last 1 Encounters:  12/16/12 213 lb 13.5 oz (97 kg)    Re-estimated needs:  Kcal: 1880-2070 Protein: 75-93g Fluid: >1.8 L/day  Skin: bilateral digital ischemia right hand and bilateral feet  Diet Order: Dysphagia 1, thin   Intake/Output Summary (Last 24 hours) at 12/16/12 1133 Last data filed at 12/16/12 0900  Gross per 24 hour  Intake 1723.6 ml  Output    535 ml  Net 1188.6 ml    Last BM: 7/20   Labs:   Recent Labs Lab 12/11/12 0400  12/14/12 0300 12/15/12 0400 12/16/12 0420  NA 150*  < > 140 141 139  K 3.3*  < > 3.3* 3.5 3.6  CL 119*  < > 110 110 110  CO2 23  < > 20 21 19   BUN 47*  < > 31* 32* 33*  CREATININE 1.06  < >  1.24* 1.35* 1.60*  CALCIUM 9.1  < > 8.5 8.5 8.6  MG 2.1  --  1.9  --   --   GLUCOSE 103*  < > 77 74 72  < > = values in this interval not displayed.  CBG (last 3)  No results found for this basename: GLUCAP,  in the last 72 hours  Scheduled Meds: . antiseptic oral rinse  15 mL Mouth Rinse q12n4p  . chlorhexidine  15 mL Mouth Rinse BID  . [START ON 01/01/2013] cyanocobalamin  1,000 mcg Intramuscular Q30 days  . diltiazem  120 mg Oral Daily  . docusate sodium  100 mg Oral BID  . mometasone-formoterol  2 puff Inhalation BID  . pantoprazole  40 mg Oral Q1200  . pregabalin  25 mg Oral BID  . tiotropium  18 mcg Inhalation Daily  . venlafaxine XR  150 mg Oral q morning - 10a    Continuous Infusions: . sodium chloride Stopped (12/16/12 0500)  . dextrose Stopped (12/15/12 0800)  . heparin 2,500 Units/hr (12/16/12 0112)    Loyce Dys, MS RD LDN Clinical Inpatient Dietitian Pager: [phone] Weekend/After hours pager: [phone]

## 2012-12-16 NOTE — Progress Notes (Signed)
[facility] Progress Note [place] TEAM 1 - Stepdown/ICU TEAM   Natalie Kent YNW:[id number] DOB: 10/26/1930 DOA: 12/18/2012 PCP: Londell Moh, MD  Brief narrative: 79 year old female with complicated hospitalization, underwent right total hip replacement this admission with post op respiratory failure, acute on chronic renal failure, pneumonia, septic shock, atrial fibrillation, and GI bleeding. On 12/11/12, pt had complaints of burning in both feet and bluish color to toes. Pt was weaned off vasopressors on 12/09/12. She was found to have a possible embolic event to the toes in setting of new onset atrial fibrillation. Phenylephrine was used from 12/07/12 and weaned 12/10/12. Consideration has been given to vasopressor related vasoconstriction as the etiology for her distal ischemic sx's since the discoloration started the day after neo was started.  Patient was on Plavix prior to admission. Vascular Surgery was consulted and recommended CTA chest/abd/pelvis with B leg runoff which has not yet been completed due to renal failure. She was stable enough to transfer out of ICU and Team 1 assumed care of the patient 7/19.   Assessment/Plan:  Acute respiratory failure 2nd to:  A) ?? HCAP B) COPD / asthma.  - PCXR on 7/16 showed stable bilateral lower lobe infiltrates vs effusions and CXR 7/19 stable and without infiltrate - repeat CXR in AM -still requiring oxygen -having low grade fevers but suspect these due to toe ischemia and not PNA  -WBC has normalized and she has no signs of active infection and has been on anbx's since admit (vancomycIn) so will dc-Maxipime added 7/20 but CXR 7/19 without focal infiltrate so will dc this also -blood cx's NGTD -cont prn xopenex nebs and Spiriva -oxygen to keep SpO2 > 92%  -holding singulair for now   Shock -PCCM felt due to PNA -Hgb  dropped to 7.7 post op from a preop baseline of 11.2 so volume status may have contributed  Bilateral  digital ischemia right hand and bilateral feet/toes -Started heparin gtt per pharmacy  -had Echo on 7/10 >> defer to VVS whether another Echo needed  -VVS rec arterial duplex in lieu of arteriogarm or CTA -has dry changes so no urgent need to pursue amputation - VVS prefers wait and watch approach and defers any future amputation to Orthopedics -VVS d/w family re: risk of renal failure, increased mortality from vascular procedure, with extremely high morbidity and mortality risk -pain control improved after addition of PO narcs- will add low dose Lyrica for neuropathic component -OT for splint to allow better mobilization and offload heels  Atrial fib / controlled VR -cont CCB and prn Lopressor -Maintaining sinus rhythm  HTN -as above  Acute renal failure on CKD 3 -baseline Scr 1.1 with GFR 46 -peak Scr this admit 2.69 and now has returned to near baseline  Anemia due to: A) chronic disease B) Acute critical illness C) ABL post op -as above (see shock) -cont iron replete -transfuse only if Hgb </= 7.0 -ASA/Plavix on hold due to anema and low platelets -has rec. 5 units PRBC's this admission  Acute encephalopathy -due to shock and acute resp failure- resolved  OA post right hip arthroplasty -per ortho -cont PT/OT   DVT prophylaxis: Heparin infusion Code Status: Full Family Communication: Patient and daughter at bedside Disposition Plan: Remain in step down  Consultants: Vascular Surgery PCCM Orthopedics  Procedures: Right total hip arthroplasty with anterior approach  Dr. Jerl Santos  12/08/2012  Antibiotics: Vancomycin 7/08 >>  Imipenem 7/09 >>7/19 Maxipime 7/20 >>  CULTURES:  7/09 Urine >>  negative  7/09 Blood >> no growth  7/09 Sputum >> Yeast    HPI/Subjective: Patient alert and confused/anxious. Really reluctant to move feet/legs or participate in PT/OT  Objective: Blood pressure 111/62, pulse 91, temperature 100 F (37.8 C), temperature source Core  (Comment), resp. rate 14, height 5\' 6"  (1.676 m), weight 97 kg (213 lb 13.5 oz), SpO2 100.00%.  Intake/Output Summary (Last 24 hours) at 12/16/12 1031 Last data filed at 12/16/12 0900  Gross per 24 hour  Intake 1814.6 ml  Output    535 ml  Net 1279.6 ml    Exam: General: No acute respiratory distress Lungs: Clear to auscultation bilaterally without wheezes or crackles,  2L Cardiovascular: Regular rate and rhythm without murmur gallop or rub normal S1 and S2, mild 1+ bilateral lower extremity peripheral edema/focally 2+ right ankle without JVD--noted with bilateral ischemic changes as evidenced by toe necrosis: Involves toes one through 3 on the left and 1 through 4 on the right was clear demarcations. This is also associated with streaking erythema up both legs to about 4 inches below the knees- an irregular pattern Abdomen: Nontender, nondistended, soft, bowel sounds positive, no rebound, no ascites, no appreciable mass Musculoskeletal: No significant cyanosis, clubbing of bilateral lower extremities Neurological: Alert and oriented x at least name and place, moves all extremities x 4 without focal neurological deficits, CN 2-12 intact  Scheduled Meds: Scheduled Meds: . antiseptic oral rinse  15 mL Mouth Rinse q12n4p  . chlorhexidine  15 mL Mouth Rinse BID  . [START ON 01/01/2013] cyanocobalamin  1,000 mcg Intramuscular Q30 days  . diltiazem  120 mg Oral Daily  . docusate sodium  100 mg Oral BID  . mometasone-formoterol  2 puff Inhalation BID  . pantoprazole  40 mg Oral Q1200  . pregabalin  25 mg Oral BID  . tiotropium  18 mcg Inhalation Daily  . venlafaxine XR  150 mg Oral q morning - 10a   Continuous Infusions: . sodium chloride Stopped (12/16/12 0500)  . dextrose Stopped (12/15/12 0800)  . heparin 2,500 Units/hr (12/16/12 0112)    Data Reviewed: Basic Metabolic Panel:  Recent Labs Lab 12/11/12 0400 12/12/12 0500 12/13/12 0430 12/14/12 0300 12/15/12 0400 12/16/12 0420   NA 150* 145 140 140 141 139  K 3.3* 3.8 3.5 3.3* 3.5 3.6  CL 119* 116* 113* 110 110 110  CO2 23 24 21 20 21 19   GLUCOSE 103* 94 75 77 74 72  BUN 47* 37* 33* 31* 32* 33*  CREATININE 1.06 1.04 1.21* 1.24* 1.35* 1.60*  CALCIUM 9.1 9.0 8.3* 8.5 8.5 8.6  MG 2.1  --   --  1.9  --   --    Liver Function Tests:  Recent Labs Lab 12/14/12 0300 12/15/12 0400 12/16/12 0420  AST 25 18 19   ALT 10 9 9   ALKPHOS 89 79 83  BILITOT 0.8 0.6 0.5  PROT 4.5* 4.3* 4.4*  ALBUMIN 1.3* 1.1* 1.2*   No results found for this basename: LIPASE, AMYLASE,  in the last 168 hours CBC:  Recent Labs Lab 12/13/12 0430 12/13/12 1450 12/14/12 0300 12/15/12 0400 12/16/12 0420  WBC 10.5 12.4* 9.3 7.4 7.9  HGB 7.1* 9.8* 9.0* 8.2* 8.1*  HCT 21.5* 29.0* 26.6* 25.2* 25.3*  MCV 88.8 87.6 88.1 89.0 89.7  PLT 236 282 281 316 381   Cardiac Enzymes: No results found for this basename: CKTOTAL, CKMB, CKMBINDEX, TROPONINI,  in the last 168 hours BNP (last 3 results)  Recent Labs  12/05/12 0918 12/08/12 0140  PROBNP 7718.0* 3055.0*   CBG:  Recent Labs Lab 12/11/12 12/11/12 0344 12/11/12 0837 12/11/12 1238 12/11/12 1737  GLUCAP 73 94 87 71 79    Recent Results (from the past 240 hour(s))  CULTURE, BLOOD (ROUTINE X 2)     Status: None   Collection Time    12/13/12  2:27 PM      Result Value Range Status   Specimen Description BLOOD RIGHT HAND   Final   Special Requests BOTTLES DRAWN AEROBIC ONLY 3CC   Final   Culture  Setup Time 12/13/2012 20:32   Final   Culture     Final   Value:        BLOOD CULTURE RECEIVED NO GROWTH TO DATE CULTURE WILL BE HELD FOR 5 DAYS BEFORE ISSUING A FINAL NEGATIVE REPORT   Report Status PENDING   Incomplete  CULTURE, BLOOD (ROUTINE X 2)     Status: None   Collection Time    12/13/12  2:31 PM      Result Value Range Status   Specimen Description BLOOD BLOOD LEFT FOREARM   Final   Special Requests BOTTLES DRAWN AEROBIC AND ANAEROBIC 8CC   Final   Culture  Setup Time  12/13/2012 20:32   Final   Culture     Final   Value:        BLOOD CULTURE RECEIVED NO GROWTH TO DATE CULTURE WILL BE HELD FOR 5 DAYS BEFORE ISSUING A FINAL NEGATIVE REPORT   Report Status PENDING   Incomplete  CLOSTRIDIUM DIFFICILE BY PCR     Status: None   Collection Time    12/13/12  7:44 PM      Result Value Range Status   C difficile by pcr NEGATIVE  NEGATIVE Final     Studies:  Recent x-ray studies have been reviewed in detail by the Attending Physician    Junious Silk, ANP [facility] Office  [phone] Pager [phone]  **If unable to reach the above provider after paging please contact the Flow Manager @ [phone]  On-Call/Text Page:      [email or web address]      password [email or web address]  If 7PM-7AM, please contact night-coverage [email or web address] Password [email or web address] 12/16/2012, 10:31 AM   LOS: 14 days    I have examined the patient, reviewed the chart and modified the above note which I agree with.   Tearra Ouk,MD [phone] 12/16/2012, 2:05 PM

## 2012-12-16 NOTE — Progress Notes (Signed)
Doctor, RN and Family (2010 to 2055) meeting with 9 family members present. Family concerned and upset about why patient transferred back to ICU. Dr Wynema Birch addressed families concerns as well as goals of care. Pt made limited code (antiarrythmia drugs only) as per families wishes.   Visitation policy discussed with family and contact information obtained.

## 2012-12-16 NOTE — Progress Notes (Signed)
PULMONARY  / CRITICAL CARE MEDICINE  Name: Natalie Kent MRN: [id number] DOB: 1931/04/15    ADMISSION DATE:  12/31/12 CONSULTATION DATE:  12/04/2012  REFERRING MD : Marcene Corning  CHIEF COMPLAINT:  Rt hip pain  BRIEF PATIENT DESCRIPTION:  79 yo female with arthritis and Rt hip pain.  Had Rt total hip arthroplasty.  Developed altered mental status, abdominal discomfort and hypotension POD#1.  PCCM consulted 7/10 and assumed care.  SIGNIFICANT EVENTS: 7/08 Rt total hip arthroplasty 7/09 Change mental status, acute renal failure 7/10 VDRF 7/15 Extubated 7/16 Still no bowel movement, wheezing on exam 7/17 VVS consult for lower extremity digital ischemia  STUDIES:  7/09 CT head >> no acute findings.  Prior Lt MCA infarct and  left MCA M1 segment stent. 7/09 Abd u/s >> no acute findings 7/09 CT abd/pelvis >> stool filled colon, anterior abdominal wall hernia 7/10 Echo >> mild LVH, EF 65 to 70%, grade 1 diastolic dysfx, mild LA dilation 7/15 ABD xray >> relatively large stool burden 7/17 ABI's  LINES / TUBES: 7/10 Lt IJ CVL >>  7/10 ETT >>7/15  7/10 Rt radial aline >> out  CULTURES: 7/09 Urine >> negative 7/09 Blood >> no growth 7/09 Sputum >> Yeast 7/22 blood>>> 7/22 urine>>> 7/22 sputum>>>  ANTIBIOTICS: Vancomycin 7/08 >> 7/18 Imipenem 7/09 >> 7/18 Vancomycin 7/22>>> Zosyn 7/22>>>  SUBJECTIVE:  Decreased pain in feet.  Denies abd pain.  VITAL SIGNS: Temp:  [98.3 F (36.8 C)-100.8 F (38.2 C)] 98.3 F (36.8 C) (07/22 1519) Pulse Rate:  [43-100] 93 (07/22 1700) Resp:  [13-24] 18 (07/22 1700) BP: (65-149)/(30-64) 74/32 mmHg (07/22 1700) SpO2:  [95 %-100 %] 97 % (07/22 1700) Weight:  [97 kg (213 lb 13.5 oz)] 97 kg (213 lb 13.5 oz) (07/22 0400) 5 liters [place]  HEMODYNAMICS: CVP:  [11 mmHg] 11 mmHg  INTAKE / OUTPUT: Intake/Output     07/21 0701 - 07/22 0700 07/22 0701 - 07/23 0700   P.O. 1040    I.V. (mL/kg) 827.6 (8.5) 150 (1.5)   IV Piggyback 300    Total Intake(mL/kg) 2167.6 (22.3) 150 (1.5)   Urine (mL/kg/hr) 620 (0.3) 170 (0.2)   Total Output 620 170   Net +1547.6 -20         PHYSICAL EXAMINATION: General: Elderly ill appear female in moderate respiratory distress. Neuro: Awake, follows commands HEENT: No sinus tenderness, R IJ site clean. Cardiovascular: irregular Lungs: diffuse wheezes Abdomen: soft, non tender Musculoskeletal:  1+ edema Skin: b/l toes dusky, dry and black, Rt fingers dusky  LABS: CBC Recent Labs     12/14/12  0300  12/15/12  0400  12/16/12  0420  WBC  9.3  7.4  7.9  HGB  9.0*  8.2*  8.1*  HCT  26.6*  25.2*  25.3*  PLT  281  316  381   Coag's No results found for this basename: APTT, INR,  in the last 72 hours BMET Recent Labs     12/14/12  0300  12/15/12  0400  12/16/12  0420  NA  140  141  139  K  3.3*  3.5  3.6  CL  110  110  110  CO2  20  21  19   BUN  31*  32*  33*  CREATININE  1.24*  1.35*  1.60*  GLUCOSE  77  74  72   Electrolytes Recent Labs     12/14/12  0300  12/15/12  0400  12/16/12  0420  CALCIUM  8.5  8.5  8.6  MG  1.9   --    --    Liver Enzymes Recent Labs     12/14/12  0300  12/15/12  0400  12/16/12  0420  AST  25  18  19   ALT  10  9  9   ALKPHOS  89  79  83  BILITOT  0.8  0.6  0.5  ALBUMIN  1.3*  1.1*  1.2*    Glucose No results found for this basename: GLUCAP,  in the last 72 hours Imaging No results found.  ASSESSMENT / PLAN:  PULMONARY A: Acute respiratory failure 2nd to HAP - PCXR on 7/16 shows stable bilateral lower lobe infiltrates vs effusions Hx of COPD/asthma. Wheezing, anticipate severe pulmonary edema and impending respiratory failure with hypotension and volume resuscitation. P:   - Changed to prn xopenex nebs, one treatment now. - F/u CXR in AM. - Oxygen to keep SpO2 > 92% - Bronchial hygiene - Used dulera while in hospital - Hold singulair for now. - Will likely require intubation before the night is out if family wants her as  full code.  CARDIOVASCULAR A: Septic shock 2nd to pneumonia >> resolved. Hx of HTN. Atrial fibrillation B/l digital ischemia of feet and Rt hand >> ? Related to pressors vs embolic phenomena; appreciate help from VVS. Now in septic shock. P:  - Continue heparin gtt per pharmacy - Had Echo on 7/10 >> defer to VVS whether another Echo needed - Levophed only after discussion with family regarding use of pressors. - CVP q4. - D/C anti-HTN. - Check cortisol level. - Stress dose steroids. - Check lactic acid level.  RENAL A: Acute kidney injury >> deteriorating with hypotension. Hypokalemia >> improved. Non gap metabolic acidosis >> resolved. P:   - IVF resuscitation. - Monitor CVP. - D/C all anti-HTN.  GASTROINTESTINAL A: Constipation >> improved 7/17 with SMOG enema. Dysphagia. Abdominal wall hernia >> CT findings d/w radiology >> likely benign findings. Hx of GERD. P:   - NPO for respiratory failure and potential intubation. - SUP, protonix. - If constipation remains an issue will consider enema but first get an abdominal x-ray for ?perf as patient is has not defecated in days and with her being a vasculopath risk of necrotic bowel with hypotension is very high. - Check lactic acid level, would like to get a CT of the abdomen to evaluate for necrotic bowel but patient is too unstable to put in a scanner at this time.  HEMATOLOGIC A: Anemia of chronic illness/iron deficiency -  PRBC transfusion on 7/08, 7/09, 7/14. 7/17. Thrombocytopenia - resolved P:  - F/U CBC - Transfuse for Hb < 7  INFECTIOUS A: HAP >> resolved. Septic shock likely. P:   - Pan culture. - Restart vanc and zosyn (was on vanc/primaxin earlier) allergic to cefalosporins. - F/U on cultures.  ENDOCRINE A: Mild hyperglycemia >> no hx of DM. P:   - ISS. - CBGs as ordered.  NEUROLOGIC A: Acute encephalopathy - 2/2 to sepsis, hypoxia, and pain meds >> improved 7/18. Hx of CVA, depression,  fibromyalgia, anxiety. Post-op pain control. Pain from lower extremity ischemia. P:   - Avoid benzo's. - PRN dilaudid for pain but judicious use given hypotension.   ORTHOPEDIC A: Arthritis s/p Rt hip arthroplasty. P: - Post-op care per ortho - PT/OT  Updated family at bedside.  Transfer to the ICU, will place back on PCCM service, discussed with two daughters, made it abundantly clear  that pressors were likely the cause of her loosing her toes in a combination with poor vascular system and hypotension and using more pressors is likely to result in more limb loss and further complications.  The daughters are unable to make decisions at this time and would like for their sister and two brothers to be here to make decisions, so will keep as full code for now, transfer to the ICU, continue IVF and once family is available will need to discuss code status, use of pressors and potential need for ventilatory support.  CC time 45 min.  Alyson Reedy, M.D. [facility] Pulmonary/Critical Care Medicine. Pager: [phone]. After hours pager: [phone].

## 2012-12-16 NOTE — Progress Notes (Signed)
Patient received from 2900 via bed. Complete transfer into our bed. Vitals obtained. Blood pressure at present 65/31,charge nurse is aware and Burnis Kingfisher this with 2900's nurse. Patient settled into the.

## 2012-12-16 NOTE — Progress Notes (Signed)
Lower extremity arterial duplex has been completed.  Technically difficult study.  Preliminary = No obvious plaque noted bilaterally.  Biphasic flow from right CFA to right PopA and monophasic flow to distal PTA. Possibly suggestive right tibial disease. Biphasic flow from left CFA to left PTA.  Farrel Demark, RDMS, RVT 12/16/2012

## 2012-12-16 NOTE — Progress Notes (Signed)
Name: VIRJEAN BOMAN MRN: [id number] DOB: December 13, 1930  ELECTRONIC ICU PHYSICIAN NOTE  Problem:  Hypotension with low uop/ no pressors due to limited code status   Intake/Output Summary (Last 24 hours) at 12/16/12 2355 Last data filed at 12/16/12 2300  Gross per 24 hour  Intake 2574.6 ml  Output    335 ml  Net 2239.6 ml     CVP:  [7 mmHg-9 mmHg] 9 mmHg    Intervention:  Fluid bolus 500 cc   Sandrea Hughs 12/16/2012, 11:55 PM

## 2012-12-17 ENCOUNTER — Inpatient Hospital Stay (HOSPITAL_COMMUNITY): Payer: Medicare Other

## 2012-12-17 LAB — CBC
HCT: 23 % — ABNORMAL LOW (ref 36.0–46.0)
MCHC: 32.6 g/dL (ref 30.0–36.0)
Platelets: 339 10*3/uL (ref 150–400)
RDW: 17.1 % — ABNORMAL HIGH (ref 11.5–15.5)

## 2012-12-17 LAB — BASIC METABOLIC PANEL
CO2: 16 mEq/L — ABNORMAL LOW (ref 19–32)
CO2: 16 mEq/L — ABNORMAL LOW (ref 19–32)
Calcium: 8.4 mg/dL (ref 8.4–10.5)
Calcium: 8.4 mg/dL (ref 8.4–10.5)
Creatinine, Ser: 2.01 mg/dL — ABNORMAL HIGH (ref 0.50–1.10)
Creatinine, Ser: 2.2 mg/dL — ABNORMAL HIGH (ref 0.50–1.10)
Glucose, Bld: 107 mg/dL — ABNORMAL HIGH (ref 70–99)
Glucose, Bld: 130 mg/dL — ABNORMAL HIGH (ref 70–99)

## 2012-12-17 LAB — HEPARIN LEVEL (UNFRACTIONATED)
Heparin Unfractionated: 0.95 IU/mL — ABNORMAL HIGH (ref 0.30–0.70)
Heparin Unfractionated: 1.09 IU/mL — ABNORMAL HIGH (ref 0.30–0.70)
Heparin Unfractionated: 0.83 IU/mL — ABNORMAL HIGH (ref 0.30–0.70)

## 2012-12-17 LAB — CORTISOL: Cortisol, Plasma: 17.8 ug/dL

## 2012-12-17 LAB — MAGNESIUM: Magnesium: 1.9 mg/dL (ref 1.5–2.5)

## 2012-12-17 LAB — GLUCOSE, CAPILLARY: Glucose-Capillary: 68 mg/dL — ABNORMAL LOW (ref 70–99)

## 2012-12-17 MED ORDER — PHENYLEPHRINE HCL 10 MG/ML IJ SOLN
30.0000 ug/min | INTRAVENOUS | Status: DC
  Administered 2012-12-17 (×2): 75 ug/min via INTRAVENOUS
  Administered 2012-12-17: 30 ug/min via INTRAVENOUS
  Filled 2012-12-17 (×3): qty 1

## 2012-12-17 MED ORDER — HEPARIN (PORCINE) IN NACL 100-0.45 UNIT/ML-% IJ SOLN
1300.0000 [IU]/h | INTRAMUSCULAR | Status: DC
  Administered 2012-12-17 (×2): 2100 [IU]/h via INTRAVENOUS
  Administered 2012-12-18: 1950 [IU]/h via INTRAVENOUS
  Administered 2012-12-18: 1800 [IU]/h via INTRAVENOUS
  Administered 2012-12-19 (×2): 1400 [IU]/h via INTRAVENOUS
  Administered 2012-12-19: 1700 [IU]/h via INTRAVENOUS
  Administered 2012-12-20 – 2012-12-22 (×4): 1300 [IU]/h via INTRAVENOUS
  Filled 2012-12-17 (×13): qty 250

## 2012-12-17 MED ORDER — SODIUM CHLORIDE 0.9 % IV BOLUS (SEPSIS)
500.0000 mL | Freq: Once | INTRAVENOUS | Status: AC
  Administered 2012-12-17: 500 mL via INTRAVENOUS

## 2012-12-17 MED ORDER — IOHEXOL 300 MG/ML  SOLN
25.0000 mL | INTRAMUSCULAR | Status: AC
  Administered 2012-12-17 (×2): 25 mL via ORAL

## 2012-12-17 MED ORDER — ACETAMINOPHEN 325 MG PO TABS
650.0000 mg | ORAL_TABLET | Freq: Four times a day (QID) | ORAL | Status: DC | PRN
  Administered 2012-12-17 (×2): 650 mg via ORAL
  Filled 2012-12-17 (×2): qty 2

## 2012-12-17 MED ORDER — PHENYLEPHRINE HCL 10 MG/ML IJ SOLN
30.0000 ug/min | INTRAVENOUS | Status: DC
  Administered 2012-12-17 (×2): 100 ug/min via INTRAVENOUS
  Administered 2012-12-18: 80 ug/min via INTRAVENOUS
  Administered 2012-12-18 (×2): 100 ug/min via INTRAVENOUS
  Administered 2012-12-19: 70 ug/min via INTRAVENOUS
  Administered 2012-12-19: 80 ug/min via INTRAVENOUS
  Administered 2012-12-19: 100 ug/min via INTRAVENOUS
  Administered 2012-12-20: 90 ug/min via INTRAVENOUS
  Administered 2012-12-22: 30 ug/min via INTRAVENOUS
  Filled 2012-12-17 (×10): qty 4

## 2012-12-17 NOTE — Progress Notes (Signed)
Speech Language Pathology Dysphagia Treatment Patient Details Name: Natalie Kent MRN: [id number] DOB: Dec 21, 1930 Today's Date: 12/17/2012 Time: 0865-7846 (1224) SLP Time Calculation (min): 19 min  Assessment / Plan / Recommendation Clinical Impression  Treatment focused on skilled dysphagia intervention with daughter present.  Following oral care, pt. consumed thin water via straw with one cough out of 4 straw sips.  Daugher states intake is decreased (pt. did not receive puree chicken salad from home due to medical issues with BP).   Lung sounds not significantly changed per RN documentation.  Not ready to observe with upgraded texture at this time.  SLP will continue to treat.     Diet Recommendation  Continue with Current Diet: Dysphagia 1 (puree);Thin liquid    SLP Plan Continue with current plan of care   Pertinent Vitals/Pain none   Swallowing Goals  SLP Swallowing Goals Swallow Study Goal #1 - Progress: Progressing toward goal Patient will utilize recommended strategies during swallow to increase swallowing safety with: Total assistance Swallow Study Goal #2 - Progress: Progressing toward goal  General Temperature Spikes Noted: No Respiratory Status: Supplemental O2 delivered via (comment) Behavior/Cognition: Alert;Cooperative;Requires cueing Oral Cavity - Dentition:  (dentures not in) Patient Positioning: Upright in bed  Oral Cavity - Oral Hygiene Does patient have any of the following "at risk" factors?: Other - dysphagia;Oxygen therapy - cannula, mask, simple oxygen devices;Nutritional status - inadequate Brush patient's teeth BID with toothbrush (using toothpaste with fluoride): Yes Patient is HIGH RISK - Oral Care Protocol followed (see row info): Yes   Dysphagia Treatment Treatment focused on: Skilled observation of diet tolerance;Patient/family/caregiver education Family/Caregiver Educated: daughter Treatment Methods/Modalities: Skilled observation Patient  observed directly with PO's: Yes Type of PO's observed: Thin liquids Feeding: Total assist Liquids provided via: Straw Pharyngeal Phase Signs & Symptoms: Immediate cough Type of cueing: Verbal Amount of cueing: Minimal        Royce Macadamia M.Ed [employer] [phone]  12/17/2012

## 2012-12-17 NOTE — Progress Notes (Signed)
Occupational Therapy Treatment Patient Details Name: Natalie Kent MRN: [id number] DOB: Mar 04, 1931 Today's Date: 12/17/2012 Time: 1000-1015, 4098-1191, 1210-1220 OT Time Calculation (min): 39 min  OT Assessment / Plan / Recommendation  History of present illness s/p elective R THA anterior approach, pt now with septic shock, UTI, acute kidney injury.  Pt with dry gangrene of bil feet.   Clinical Impression Pt tolerating PRAFOs to B LEs with wearing schedule of 2 hours on 1 hour off. Skin integrity maintained. Will continue to follow.   OT comments    Follow Up Recommendations  SNF    Barriers to Discharge       Equipment Recommendations  None recommended by OT    Recommendations for Other Services    Frequency     Progress towards OT Goals    Plan Discharge plan remains appropriate    Precautions / Restrictions Precautions Precautions: Fall Precaution Comments: Direct anterior hip no precautions; gangrnous toes - very painful Required Braces or Orthoses: Other Brace/Splint Other Brace/Splint: B PRAFO with wearing schedule of 2 hours on, 1 hour off Restrictions Weight Bearing Restrictions: No RLE Weight Bearing: Weight bearing as tolerated   Pertinent Vitals/Pain No c/o pain, monitored throughout    ADL  Eating/Feeding: Maximal assistance Where Assessed - Eating/Feeding: Bed level Grooming: Wash/dry face;Supervision/safety Where Assessed - Grooming: Supine, head of bed up ADL Comments: Seen for [facility] wear.  Pt initially resistant to use, reported they gave her pain.  Agreed to one hour of wear.  OT returned after that one hour and repositioned and checked skin.  Pt repositioned for one more hour and returned to doff.  Pt tolerating well.  Explained benefit of use to nurse and pt.    OT Diagnosis:    OT Problem List:   OT Treatment Interventions:     OT Goals(current goals can now be found in the care plan section)    Visit Information  Last OT Received On:  12/17/12 History of Present Illness: s/p elective R THA anterior approach, pt now with septic shock, UTI, acute kidney injury.  Pt with dry gangrene of bil feet.    Subjective Data      Prior Functioning       Cognition  Cognition Arousal/Alertness: Awake/alert Behavior During Therapy: Flat affect Overall Cognitive Status: Impaired/Different from baseline Area of Impairment: Attention Current Attention Level: Sustained Problem Solving: Slow processing    Mobility       Exercises      Balance     End of Session OT - End of Session Activity Tolerance: Patient tolerated treatment well Patient left: in bed;with call bell/phone within reach;with nursing/sitter in room;with family/visitor present Nurse Communication:  (use of PRAFOS, schedule)  GO     Evern Bio 12/17/2012, 12:27 PM [phone]

## 2012-12-17 NOTE — Progress Notes (Signed)
[facility]  Daily Progress Note  Assessment/Planning: Hypotension, B toe ischemia   Obviously sepsis work-up is priority at this point.  This patient does NOT have wet gangrene.  B arterial duplex consistent with suspicion that pt's toe ischemia is not primarily due to PAD  No significant response to anticoagulation in both feet unfortunately.    At some point, patient will likely need R TMA and L toe amputations.  I will defer to Orthopedics for those.  Nothing more to offer from a vascular viewpoint.  Awaiting CTA Chest/abd/pelvis with leg runoff to finish embolic work-up.  Subjective    Cont B feet pain, hypotension overnight as documented in chart  Objective Filed Vitals:   12/17/12 1045 12/17/12 1100 12/17/12 1115 12/17/12 1130  BP: 94/53 90/33 79/51  101/78  Pulse: 74 33 78 68  Temp:      TempSrc:      Resp: 18 17 17 19   Height:      Weight:      SpO2: 100% 96% 99% 99%    Intake/Output Summary (Last 24 hours) at 12/17/12 1156 Last data filed at 12/17/12 1126  Gross per 24 hour  Intake 4693.17 ml  Output    240 ml  Net 4453.17 ml   VASC  R foot toes likely non-viable, no evidence of ascending cellulitis or necrosis; L toes 1-3 ischemic, no evidence of ascending cellulitis or necrosis  Laboratory CBC    Component Value Date/Time   WBC 9.3 12/17/2012 0430   HGB 7.5* 12/17/2012 0430   HCT 23.0* 12/17/2012 0430   PLT 339 12/17/2012 0430    BMET    Component Value Date/Time   NA 139 12/17/2012 0430   K 3.9 12/17/2012 0430   CL 111 12/17/2012 0430   CO2 16* 12/17/2012 0430   GLUCOSE 107* 12/17/2012 0430   BUN 38* 12/17/2012 0430   CREATININE 2.01* 12/17/2012 0430   CALCIUM 8.4 12/17/2012 0430   GFRNONAA 22* 12/17/2012 0430   GFRAA 26* 12/17/2012 0430   Non-Invasive Studies: 12/16/12  B arterial duplex  Technically limited study due to body habitus and limited mobility.  No obvious plaque noted bilaterally.   Biphasic  waveforms noted throughout bilateral arterial systems, except for right PTA which is monophasic.   Possible suggestive of tibial disease.  Leonides Sake, MD [facility] Office: [phone] Pager: [phone]  12/17/2012, 11:56 AM

## 2012-12-17 NOTE — Progress Notes (Signed)
ANTICOAGULATION CONSULT NOTE - Follow Up Consult  Pharmacy Consult for heparin Indication: atrial fibrillation and ischemia of feet/hand  Labs:  Recent Labs  12/15/12 0400  12/16/12 0010 12/16/12 0420 12/16/12 0903 12/16/12 1730 12/16/12 1932 12/17/12 0430  HGB 8.2*  --   --  8.1*  --  7.6*  --  7.5*  HCT 25.2*  --   --  25.3*  --  23.0*  --  23.0*  PLT 316  --   --  381  --  358  --  339  HEPARINUNFRC 0.26*  < > 0.16*  --  0.32  --   --  0.95*  CREATININE 1.35*  --   --  1.60*  --   --   --   --   TROPONINI  --   --   --   --   --   --  <0.30  --   < > = values in this interval not displayed.   Assessment: 79yo female now supratherapeutic on heparin after one level at low end of goal.  Goal of Therapy:  Heparin level 0.3-0.5 units/ml   Plan:  Will decrease heparin gtt by 2 units/kg/hr to 2400 units/hr and check level in 8hr.  Vernard Gambles, PharmD, BCPS  12/17/2012,5:24 AM

## 2012-12-17 NOTE — Progress Notes (Signed)
ANTICOAGULATION CONSULT NOTE - Follow Up Consult  Pharmacy Consult for heparin Indication: atrial fibrillation and ischemia of feet/hand  Patient Measurements:  Height: 5\' 6"  (167.6 cm)  Weight: 213 lb 13.5 oz (97 kg)  IBW/kg (Calculated) : 59.3  Adjusted Body Weight: 74 kg  Labs:  Recent Labs  12/15/12 0400  12/16/12 0420 12/16/12 0903 12/16/12 1730 12/16/12 1932 12/17/12 0430 12/17/12 1300  HGB 8.2*  --  8.1*  --  7.6*  --  7.5*  --   HCT 25.2*  --  25.3*  --  23.0*  --  23.0*  --   PLT 316  --  381  --  358  --  339  --   HEPARINUNFRC 0.26*  < >  --  0.32  --   --  0.95* 1.09*  CREATININE 1.35*  --  1.60*  --   --   --  2.01*  --   TROPONINI  --   --   --   --   --  <0.30  --   --   < > = values in this interval not displayed.   Assessment: 79yo female on Heparin for afib/ischemia of phalanges. Remains supratherapeutic on heparin after rate decrease this morning.  No bleeding noted, but Hgb is 7.5. Patient has required blood transfusion this admit; last on 7/19.     Goal of Therapy:  Heparin level 0.3-0.5 units/ml   Plan:  Will hold heparin for 1 hour then decrease heparin to 2100 units/hr and check level in 8hr.  Thanks, Piedad Climes, PharmD Clinical Pharmacist - Resident Pager: [phone] Pharmacy: [phone] 12/17/2012 2:57 PM

## 2012-12-17 NOTE — Progress Notes (Addendum)
Updated POA - Natalie Kent: continues to be hypotensive, decreased UO.  Already has CVL.  Will start low dose Neo (for now limited to 150).  Code status updated accordingly.  Will repeat lactate this AM.

## 2012-12-17 NOTE — Progress Notes (Signed)
Subjective: 15 Days Post-Op Procedure(s) (LRB): TOTAL HIP ARTHROPLASTY ANTERIOR APPROACH (Right) Dry gangrene toes bilateral Activity level:  Currently at bed rest. When appropriate can put weight on right leg. Diet tolerance:  light eating-pured diet Voiding:  Foley catheter Patient reports pain as 2 on 0-10 scale.    Objective: Vital signs in last 24 hours: Temp:  [97.6 F (36.4 C)-99.7 F (37.6 C)] 98 F (36.7 C) (07/23 1224) Pulse Rate:  [25-128] 68 (07/23 1130) Resp:  [15-26] 19 (07/23 1130) BP: (54-134)/(23-102) 101/78 mmHg (07/23 1130) SpO2:  [79 %-100 %] 99 % (07/23 1130)  Labs:  Recent Labs  12/15/12 0400 12/16/12 0420 12/16/12 1730 12/17/12 0430  HGB 8.2* 8.1* 7.6* 7.5*    Recent Labs  12/16/12 1730 12/17/12 0430  WBC 7.5 9.3  RBC 2.58* 2.57*  HCT 23.0* 23.0*  PLT 358 339    Recent Labs  12/16/12 0420 12/17/12 0430  NA 139 139  K 3.6 3.9  CL 110 111  CO2 19 16*  BUN 33* 38*  CREATININE 1.60* 2.01*  GLUCOSE 72 107*  CALCIUM 8.6 8.4   No results found for this basename: LABPT, INR,  in the last 72 hours  Physical Exam:  Compartment soft Dressing clean and dry currently. Mild serous drainage. Bilateral feet and toes with obvious dry gangrene. Assessment/Plan:  15 Days Post-Op Procedure(s) (LRB): TOTAL HIP ARTHROPLASTY ANTERIOR APPROACH (Right) Up with therapy-bed to chair transfer when appropriate with medical team. Will speak to the nurse and have staples right hip removed tomorrow Continue to observe the feet for demarcation. Surgery most likely down the road when stable. I have spoken to her 2 daughters today about her condition. CT scan of chest, abdomen, pelvis to be done in the future.    Novaleigh Kohlman R 12/17/2012, 12:47 PM

## 2012-12-17 NOTE — Progress Notes (Signed)
Name: Natalie Kent MRN: [id number] DOB: 02/02/1931  ELECTRONIC ICU PHYSICIAN NOTE  Problem:  Pt requesting to resume feeding  CT abd c/w vol overload   Intake/Output Summary (Last 24 hours) at 12/17/12 2344 Last data filed at 12/17/12 2300  Gross per 24 hour  Intake 4942.8 ml  Output    160 ml  Net 4782.8 ml    CVP:  [7 mmHg-17 mmHg] 13 mmHg    Intervention:  Resume feeds, kvo fluids - bp still low on max neo so no room to attempt diuresis at this point, pt remains limited code  Sandrea Hughs 12/17/2012, 11:43 PM

## 2012-12-17 NOTE — Progress Notes (Signed)
PULMONARY  / CRITICAL CARE MEDICINE  Name: Natalie Kent MRN: [id number] DOB: 12-05-1930    ADMISSION DATE:  12/19/2012 CONSULTATION DATE:  12/04/2012  REFERRING MD : Marcene Corning  CHIEF COMPLAINT:  Rt hip pain  BRIEF PATIENT DESCRIPTION:  79 yo female with arthritis and Rt hip pain.  Had Rt total hip arthroplasty.  Developed altered mental status, abdominal discomfort and hypotension POD#1.  PCCM consulted 7/10 , required mechanical ventilation until 7/15, course complicated by gangrene of toes due to pressors. PCCM reconsulted 7/22 for hypotension, AKI, presumed ileus vs abdominal sepsis  SIGNIFICANT EVENTS: 7/08 Rt total hip arthroplasty 7/09 Change mental status, acute renal failure 7/10 VDRF 7/15 Extubated 7/16 Still no bowel movement, wheezing on exam 7/17 VVS consult for lower extremity digital ischemia  STUDIES:  7/09 CT head >> no acute findings.  Prior Lt MCA infarct and  left MCA M1 segment stent. 7/09 Abd u/s >> no acute findings 7/09 CT abd/pelvis >> stool filled colon, anterior abdominal wall hernia 7/10 Echo >> mild LVH, EF 65 to 70%, grade 1 diastolic dysfx, mild LA dilation 7/15 ABD xray >> relatively large stool burden 7/17 ABI's  LINES / TUBES: 7/10 Lt IJ CVL >>  7/10 ETT >>7/15  7/10 Rt radial aline >> out  CULTURES: 7/09 Urine >> negative 7/09 Blood >> no growth 7/09 Sputum >> Yeast 7/22 blood>>> 7/22 urine>>> 7/22 sputum>>>  ANTIBIOTICS: Vancomycin 7/08 >> 7/18 Imipenem 7/09 >> 7/18 Vancomycin 7/22>>> Zosyn 7/22>>>  SUBJECTIVE:  Mild abdominal pain. Mental status fluctuant Afebrile Poor Uo  VITAL SIGNS: Temp:  [97.6 F (36.4 C)-100.4 F (38 C)] 98.9 F (37.2 C) (07/23 0838) Pulse Rate:  [25-128] 77 (07/23 0845) Resp:  [15-26] 18 (07/23 0845) BP: (54-134)/(23-102) 81/43 mmHg (07/23 0845) SpO2:  [79 %-100 %] 100 % (07/23 0845) 5 liters [place]  HEMODYNAMICS: CVP:  [7 mmHg-17 mmHg] 17 mmHg  INTAKE / OUTPUT: Intake/Output     07/22  0701 - 07/23 0700 07/23 0701 - 07/24 0700   P.O. 100    I.V. (mL/kg) 1322.2 (13.6) 360.5 (3.7)   Other 120    IV Piggyback 2350    Total Intake(mL/kg) 3892.2 (40.1) 360.5 (3.7)   Urine (mL/kg/hr) 315 (0.1)    Total Output 315 0   Net +3577.2 +360.5        Stool Occurrence  1 x    PHYSICAL EXAMINATION: General: Elderly ill appear female Neuro: Awake, follows commands, talks HEENT: NCAT Cardiovascular: irregularly irregular Lungs: bilateral breath sounds present Abdomen: soft, mildly tender, distended Musculoskeletal: bilateral feet have black toes Skin: black toes bilaterally  LABS:  Recent Labs Lab 12/11/12 0400  12/13/12 1051  12/14/12 0300 12/15/12 0400 12/16/12 0420 12/16/12 1730 12/16/12 1750 12/16/12 1800 12/16/12 1816 12/16/12 1932 12/17/12 0430 12/17/12 0655  HGB 7.5*  < >  --   < > 9.0* 8.2* 8.1* 7.6*  --   --   --   --  7.5*  --   WBC 17.9*  < >  --   < > 9.3 7.4 7.9 7.5  --   --   --   --  9.3  --   PLT 204  < >  --   < > 281 316 381 358  --   --   --   --  339  --   NA 150*  < >  --   --  140 141 139  --   --   --   --   --  139  --   K 3.3*  < >  --   --  3.3* 3.5 3.6  --   --   --   --   --  3.9  --   CL 119*  < >  --   --  110 110 110  --   --   --   --   --  111  --   CO2 23  < >  --   --  20 21 19   --   --   --   --   --  16*  --   GLUCOSE 103*  < >  --   --  77 74 72  --   --   --   --   --  107*  --   BUN 47*  < >  --   --  31* 32* 33*  --   --   --   --   --  38*  --   CREATININE 1.06  < >  --   --  1.24* 1.35* 1.60*  --   --   --   --   --  2.01*  --   CALCIUM 9.1  < >  --   --  8.5 8.5 8.6  --   --   --   --   --  8.4  --   MG 2.1  --   --   --  1.9  --   --   --   --   --   --   --  1.9  --   PHOS  --   --   --   --   --   --   --   --   --   --   --   --  5.4*  --   AST  --   --   --   --  25 18 19   --   --   --   --   --   --   --   ALT  --   --   --   --  10 9 9   --   --   --   --   --   --   --   ALKPHOS  --   --   --   --  89 79 83  --    --   --   --   --   --   --   BILITOT  --   --   --   --  0.8 0.6 0.5  --   --   --   --   --   --   --   PROT  --   --   --   --  4.5* 4.3* 4.4*  --   --   --   --   --   --   --   ALBUMIN  --   --   --   --  1.3* 1.1* 1.2*  --   --   --   --   --   --   --   APTT 46*  --   --   --   --   --   --   --   --   --   --   --   --   --   INR 1.40  --   --   --   --   --   --   --   --   --   --   --   --   --  LATICACIDVEN  --   < >  --   --   --   --   --  1.1 1.1  --   --   --   --  1.0  TROPONINI  --   --   --   --   --   --   --   --   --   --   --  <0.30  --   --   PROCALCITON  --   --   --   --   --   --   --   --   --  1.02  --   --   --   --   PHART  --   --  7.373  --   --   --   --   --   --   --  7.348*  --   --   --   PCO2ART  --   --  31.5*  --   --   --   --   --   --   --  29.2*  --   --   --   PO2ART  --   --  84.0  --   --   --   --   --   --   --  83.4  --   --   --   < > = values in this interval not displayed.  Recent Labs Lab 12/11/12 0837 12/11/12 1238 12/11/12 1737 12/16/12 1839 12/16/12 1917  GLUCAP 87 71 79 68* 111*   CXR: no obvious PNA, worsened RLL effusion  ASSESSMENT / PLAN:  PULMONARY A: Acute respiratory failure 2nd to HAP - Hx of COPD/asthma. Wheezing, anticipate severe pulmonary edema and impending respiratory failure with hypotension and volume resuscitation. P:   - F/u CXR in AM. - Oxygen to keep SpO2 > 92% - Bronchial hygiene - Use xopenex + spiriva while in hospital - Hold singulair for now. -DNI status -Continue bronchodilators xopenex, spiriva  CARDIOVASCULAR A: Septic shock 2nd to pneumonia >> resolved. Hx of HTN. Atrial fibrillation B/l digital ischemia of feet and Rt hand >>  Related to pressors, no PAD  Now in septic shock, doubt PE since was on heparin. P:  - Continue heparin gtt per pharmacy - Had Echo on 7/10 >> defer to VVS whether another Echo needed - CVP q4. - Stress dose steroids. - periodically check lactate (again  tomrorow) - on neo, goal sbp 80, max dose 150 - Limited code - no cpr or cardioversion -Chk duplex BLEs -will need amputation of toes at some point  RENAL A: Acute kidney injury >> deteriorating with hypotension. Hypokalemia >> improved. Non gap metabolic acidosis >> resolved. P:   - IVF resuscitation. - Monitor CVP. - Avoid nephrotoxic agents - Trend BMET - Avoid contrast  GASTROINTESTINAL A: Constipation >> improved 7/17 with SMOG enema. Dysphagia. Abdominal wall hernia >> CT findings d/w radiology >> likely benign findings. Hx of GERD. P:   - will give liquid diet - SUP, protonix. - check KUB of abdomen. If has ileus, will give PO contrast and get abdominal CT-also image hip att he same time - Not worried about necrotic bowel due to normal lactate  HEMATOLOGIC A: Anemia of chronic illness/iron deficiency -  PRBC transfusion on 7/08, 7/09, 7/14. 7/17. Thrombocytopenia - resolved P:  - F/U CBC - Transfuse for Hb < 7  INFECTIOUS A: HAP >> resolved. Septic shock likely,  source unclear P:   - continue vanc/zosyn. - F/U on cultures. -CT R hip to look for signs of infected arthroplasty  ENDOCRINE A: Mild hyperglycemia >> no hx of DM. P:   - ISS. - CBGs as ordered.  NEUROLOGIC A: Acute encephalopathy - 2/2 to sepsis, hypoxia, and pain meds >> improved 7/18. Hx of CVA, depression, fibromyalgia, anxiety. Post-op pain control. Pain from lower extremity ischemia. P:   - Avoid benzo's. - PRN oxycodone, if has further pain will give fentanyl  ORTHOPEDIC A: Arthritis s/p Rt hip arthroplasty. P: - Post-op care per ortho - PT/OT   GLOBAL - discussed with POA , daughter Elease Hashimoto - care limitations placed, cap neo at 100 mcg due to limb ischemia, Doubt she is a candidate for surgical intervention or HD should this be required. May have to increase level of palliation if worsens.   The patient is critically ill with multiple organ systems failure and requires high  complexity decision making for assessment and support, frequent evaluation and titration of therapies, application of advanced monitoring technologies and extensive interpretation of multiple databases. Critical Care Time devoted to patient care services described in this note is 45 minutes.   Abigail Miyamoto, MD Family Medicine PGY-2

## 2012-12-18 ENCOUNTER — Inpatient Hospital Stay (HOSPITAL_COMMUNITY): Payer: Medicare Other

## 2012-12-18 ENCOUNTER — Encounter (HOSPITAL_COMMUNITY): Payer: Self-pay

## 2012-12-18 LAB — CBC
HCT: 25.6 % — ABNORMAL LOW (ref 36.0–46.0)
MCH: 29.3 pg (ref 26.0–34.0)
MCHC: 33.2 g/dL (ref 30.0–36.0)
MCV: 88.3 fL (ref 78.0–100.0)
RDW: 17 % — ABNORMAL HIGH (ref 11.5–15.5)

## 2012-12-18 LAB — HEPARIN LEVEL (UNFRACTIONATED): Heparin Unfractionated: 0.82 IU/mL — ABNORMAL HIGH (ref 0.30–0.70)

## 2012-12-18 LAB — BASIC METABOLIC PANEL
BUN: 43 mg/dL — ABNORMAL HIGH (ref 6–23)
Calcium: 8.4 mg/dL (ref 8.4–10.5)
GFR calc non Af Amer: 19 mL/min — ABNORMAL LOW (ref >90)
Glucose, Bld: 106 mg/dL — ABNORMAL HIGH (ref 70–99)

## 2012-12-18 LAB — URINE CULTURE

## 2012-12-18 MED ORDER — ALBUTEROL SULFATE (5 MG/ML) 0.5% IN NEBU
2.5000 mg | INHALATION_SOLUTION | RESPIRATORY_TRACT | Status: DC
  Administered 2012-12-18 – 2012-12-22 (×23): 2.5 mg via RESPIRATORY_TRACT
  Filled 2012-12-18 (×24): qty 0.5

## 2012-12-18 MED ORDER — IPRATROPIUM BROMIDE 0.02 % IN SOLN
0.5000 mg | RESPIRATORY_TRACT | Status: DC
  Administered 2012-12-18 – 2012-12-22 (×23): 0.5 mg via RESPIRATORY_TRACT
  Filled 2012-12-18 (×23): qty 2.5

## 2012-12-18 MED ORDER — PANTOPRAZOLE SODIUM 40 MG IV SOLR
40.0000 mg | Freq: Every day | INTRAVENOUS | Status: DC
  Administered 2012-12-18: 40 mg via INTRAVENOUS
  Filled 2012-12-18 (×2): qty 40

## 2012-12-18 MED ORDER — VITAL AF 1.2 CAL PO LIQD
1000.0000 mL | ORAL | Status: DC
  Filled 2012-12-18: qty 1000

## 2012-12-18 MED ORDER — ALBUTEROL SULFATE (5 MG/ML) 0.5% IN NEBU: 2.5000 mg | INHALATION_SOLUTION | RESPIRATORY_TRACT | Status: DC | PRN

## 2012-12-18 MED ORDER — FENTANYL CITRATE 0.05 MG/ML IJ SOLN
25.0000 ug | INTRAMUSCULAR | Status: DC | PRN
  Administered 2012-12-19: 50 ug via INTRAVENOUS
  Administered 2012-12-19: 25 ug via INTRAVENOUS
  Filled 2012-12-18 (×2): qty 2

## 2012-12-18 MED ORDER — FUROSEMIDE 10 MG/ML IJ SOLN
80.0000 mg | Freq: Once | INTRAMUSCULAR | Status: AC
  Administered 2012-12-18: 80 mg via INTRAVENOUS
  Filled 2012-12-18: qty 4

## 2012-12-18 MED ORDER — VITAL AF 1.2 CAL PO LIQD
1000.0000 mL | ORAL | Status: DC
  Administered 2012-12-19 – 2012-12-21 (×3): 1000 mL
  Filled 2012-12-18 (×8): qty 1000

## 2012-12-18 NOTE — Progress Notes (Signed)
ANTICOAGULATION CONSULT NOTE - Follow Up Consult  Pharmacy Consult for heparin Indication: atrial fibrillation and ischemia of feet/hand  Patient Measurements:  Height: 5\' 6"  (167.6 cm)  Weight: 213 lb 13.5 oz (97 kg)  IBW/kg (Calculated) : 59.3  Adjusted Body Weight: 74 kg  Labs:  Recent Labs  12/16/12 1730 12/16/12 1932 12/17/12 0430  12/17/12 1900 12/17/12 2245 12/18/12 0500 12/18/12 1545  HGB 7.6*  --  7.5*  --   --   --  8.5*  --   HCT 23.0*  --  23.0*  --   --   --  25.6*  --   PLT 358  --  339  --   --   --  411*  --   HEPARINUNFRC  --   --  0.95*  < >  --  0.83* 0.82* 0.54  CREATININE  --   --  2.01*  --  2.20*  --  2.33*  --   TROPONINI  --  <0.30  --   --   --   --   --   --   < > = values in this interval not displayed.  Assessment: 79yo female on Heparin for afib/ischemia of phalanges. She is receiving IV heparin and level tonight (0.54) is above desired goal range of 0.5.    Goal of Therapy:  Heparin level 0.3-0.5   Plan:  1.  Decrease IV heparin rate to 1700 units / hr 2.  F/U AM heparin level and adjust accordingly 3.  Monitor CBC and s/s of bleeding complications  Thank you. Okey Regal, PharmD [phone]  12/18/2012 4:19 PM

## 2012-12-18 NOTE — Progress Notes (Signed)
ANTIBIOTIC CONSULT NOTE  Pharmacy Consult for Vancomycin and Zosyn Indication:  Sepsis-unclear source   Allergies  Allergen Reactions  . Solarcaine Aloe Extra (Lidocaine Hcl)   . Septra (Bactrim)   . Cephalexin Rash    Patient Measurements: Height: 5\' 6"  (167.6 cm) Weight: 233 lb 4 oz (105.8 kg) IBW/kg (Calculated) : 59.3 Adjusted Body Weight:  74 kg  Vital Signs: Temp: 97 F (36.1 C) (07/24 1244) Temp src: Oral (07/24 1244) BP: 87/44 mmHg (07/24 1415) Pulse Rate: 67 (07/24 1415) Intake/Output from previous day: 07/23 0701 - 07/24 0700 In: 4118.8 [P.O.:90; I.V.:3278.8; NG/GT:350; IV Piggyback:400] Out: 68 [Urine:68] Intake/Output from this shift: Total I/O In: 521.9 [I.V.:521.9] Out: 0   Labs:  Recent Labs  12/16/12 1730 12/17/12 0430 12/17/12 1900 12/18/12 0500  WBC 7.5 9.3  --  21.8*  HGB 7.6* 7.5*  --  8.5*  PLT 358 339  --  411*  CREATININE  --  2.01* 2.20* 2.33*   Estimated Creatinine Clearance: 24.1 ml/min (by C-G formula based on Cr of 2.33).  Recent Labs  12/18/12 0800  VANCORANDOM 42.8     Microbiology: Recent Results (from the past 720 hour(s))  SURGICAL PCR SCREEN     Status: Abnormal   Collection Time    11/25/12  2:37 PM      Result Value Range Status   MRSA, PCR POSITIVE (*) NEGATIVE Final   Staphylococcus aureus POSITIVE (*) NEGATIVE Final   Comment:            The Xpert SA Assay (FDA     approved for NASAL specimens     in patients over 53 years of age),     is one component of     a comprehensive surveillance     program.  Test performance has     been validated by [employer] for patients greater     than or equal to 70 year old.     It is not intended     to diagnose infection nor to     guide or monitor treatment.  URINE CULTURE     Status: None   Collection Time    11/25/12  2:37 PM      Result Value Range Status   Specimen Description URINE, CLEAN CATCH   Final   Special Requests NONE   Final   Culture  Setup  Time 11/25/2012 15:39   Final   Colony Count 80,000 COLONIES/ML   Final   Culture     Final   Value: GROUP B STREP(S.AGALACTIAE)ISOLATED     Note: TESTING AGAINST S. AGALACTIAE NOT ROUTINELY PERFORMED DUE TO PREDICTABILITY OF AMP/PEN/VAN SUSCEPTIBILITY.   Report Status 11/26/2012 FINAL   Final  URINE CULTURE     Status: None   Collection Time    12/03/12 11:16 AM      Result Value Range Status   Specimen Description URINE, RANDOM   Final   Special Requests NONE   Final   Culture  Setup Time 12/03/2012 17:13   Final   Colony Count NO GROWTH   Final   Culture NO GROWTH   Final   Report Status 12/04/2012 FINAL   Final  CULTURE, BLOOD (ROUTINE X 2)     Status: None   Collection Time    12/03/12  4:40 PM      Result Value Range Status   Specimen Description BLOOD LEFT ARM   Final   Special Requests BOTTLES  DRAWN AEROBIC ONLY 5CC   Final   Culture  Setup Time 12/04/2012 00:10   Final   Culture NO GROWTH 5 DAYS   Final   Report Status 12/10/2012 FINAL   Final  CULTURE, BLOOD (ROUTINE X 2)     Status: None   Collection Time    12/03/12  4:45 PM      Result Value Range Status   Specimen Description BLOOD LEFT HAND   Final   Special Requests BOTTLES DRAWN AEROBIC ONLY 5CC   Final   Culture  Setup Time 12/04/2012 00:11   Final   Culture NO GROWTH 5 DAYS   Final   Report Status 12/10/2012 FINAL   Final  CULTURE, BAL-QUANTITATIVE     Status: None   Collection Time    12/05/12  3:06 PM      Result Value Range Status   Specimen Description BRONCHIAL ALVEOLAR LAVAGE   Final   Special Requests NONE   Final   Gram Stain     Final   Value: NO WBC SEEN     NO SQUAMOUS EPITHELIAL CELLS SEEN     NO ORGANISMS SEEN   Colony Count 25,000 COLONIES/ML   Final   Culture YEAST   Final   Report Status 12/07/2012 FINAL   Final  CULTURE, BLOOD (ROUTINE X 2)     Status: None   Collection Time    12/13/12  2:27 PM      Result Value Range Status   Specimen Description BLOOD RIGHT HAND   Final    Special Requests BOTTLES DRAWN AEROBIC ONLY 3CC   Final   Culture  Setup Time 12/13/2012 20:32   Final   Culture     Final   Value:        BLOOD CULTURE RECEIVED NO GROWTH TO DATE CULTURE WILL BE HELD FOR 5 DAYS BEFORE ISSUING A FINAL NEGATIVE REPORT   Report Status PENDING   Incomplete  CULTURE, BLOOD (ROUTINE X 2)     Status: None   Collection Time    12/13/12  2:31 PM      Result Value Range Status   Specimen Description BLOOD BLOOD LEFT FOREARM   Final   Special Requests BOTTLES DRAWN AEROBIC AND ANAEROBIC 8CC   Final   Culture  Setup Time 12/13/2012 20:32   Final   Culture     Final   Value:        BLOOD CULTURE RECEIVED NO GROWTH TO DATE CULTURE WILL BE HELD FOR 5 DAYS BEFORE ISSUING A FINAL NEGATIVE REPORT   Report Status PENDING   Incomplete  CLOSTRIDIUM DIFFICILE BY PCR     Status: None   Collection Time    12/13/12  7:44 PM      Result Value Range Status   C difficile by pcr NEGATIVE  NEGATIVE Final  CULTURE, BLOOD (ROUTINE X 2)     Status: None   Collection Time    12/16/12  7:45 PM      Result Value Range Status   Specimen Description BLOOD ARM LEFT   Final   Special Requests BOTTLES DRAWN AEROBIC AND ANAEROBIC 10CC EACH   Final   Culture  Setup Time 12/17/2012 01:24   Final   Culture     Final   Value:        BLOOD CULTURE RECEIVED NO GROWTH TO DATE CULTURE WILL BE HELD FOR 5 DAYS BEFORE ISSUING A FINAL NEGATIVE REPORT   Report Status PENDING  Incomplete  CULTURE, BLOOD (ROUTINE X 2)     Status: None   Collection Time    12/16/12  7:55 PM      Result Value Range Status   Specimen Description BLOOD HAND RIGHT   Final   Special Requests     Final   Value: BOTTLES DRAWN AEROBIC AND ANAEROBIC 10CC AEROBIC 5CC ANAEROBIC   Culture  Setup Time 12/17/2012 01:23   Final   Culture     Final   Value:        BLOOD CULTURE RECEIVED NO GROWTH TO DATE CULTURE WILL BE HELD FOR 5 DAYS BEFORE ISSUING A FINAL NEGATIVE REPORT   Report Status PENDING   Incomplete  URINE CULTURE      Status: None   Collection Time    12/16/12 11:01 PM      Result Value Range Status   Specimen Description URINE, CATHETERIZED   Final   Special Requests NONE   Final   Culture  Setup Time 12/16/2012 23:25   Final   Colony Count NO GROWTH   Final   Culture NO GROWTH   Final   Report Status 12/18/2012 FINAL   Final  CLOSTRIDIUM DIFFICILE BY PCR     Status: None   Collection Time    12/18/12  8:56 AM      Result Value Range Status   C difficile by pcr NEGATIVE  NEGATIVE Final    Assessment: Vanc and Zosyn re-ordered for sepsis of unknown source.  Day 3 of Zosyn and Day 15 of Vanc. Afeb. WBC 21.8. Per vascular- toes not source of sepsis. LA 1 yesterday. CT R hip not source of infx.   12 hour random Vanc - 42.8. Difficult to interpret random 12-hr level on q24hr dosing, but apparent that trough would be above goal .   Goal of Therapy:  Vancomycin trough level 15-20 mcg/ml  Plan:  Hold Vanc and check random level in AM; perform kinetics to determine appropriate dose and schedule Continue Zosyn 3.375 g IV q8h (4h infusion) Monitor clinical status, renal function and culture data daily.   Thank you, Piedad Climes, PharmD Clinical Pharmacist - Resident Pager: [phone] Pharmacy: [phone] 12/18/2012 2:45 PM

## 2012-12-18 NOTE — Progress Notes (Signed)
Thank you for consulting the Palliative Medicine Team at [facility] to meet your patient's and family's needs.   The reason that you asked [place] to see your patient is for goals of care discussion  We have scheduled your patient for a meeting: per daughter Patricia's request, meeting scheduled for Monday 7/28 @ 5:30 pm The Surrogate decision maker is: family members have deferred to eldest daughter Elease Hashimoto - however they have all been participating   Other family members that need to be present: patient has large family and daughter Elease Hashimoto would like for as many family as can participate to be present  Your patient is able/unable to participate: TBD/unable on this visit -patient extremely lethargic on visit  Additional Narrative: Gershon Cull spoke with patient's daughter Edison Simon at bedside who stated when writer introduced PMT " we don't want you now... my sister is not here, she needs to be involved"- informed Texie that Clinical research associate was only trying to set an appointment for family to participate Texie deferred to her sister; spoke with daughter Elease Hashimoto [phone]) who shared that the family is very overwhelmed at this time and are of differing opinions as to whether to meet with the PMT - she is out of town until Monday and would like to tentatively schedule meeting for Monday 7/28 @ 5:30 pm    Valente David, RN 12/18/2012, 5:31 PM Palliative Medicine Team RN Liaison [phone]

## 2012-12-18 NOTE — Progress Notes (Signed)
Occupational Therapy Treatment Patient Details Name: Natalie Kent MRN: [id number] DOB: 08-Jun-1930 Today's Date: 12/18/2012 Time: 6644-0347 OT Time Calculation (min): 36 min  OT Assessment / Plan / Recommendation  History of present illness s/p elective R THA anterior approach, pt now with septic shock, UTI, acute kidney injury.  Pt with dry gangrene of bil feet.      OT comments  Pt making progress, low BP hindering progression with transfers due to pt on max pressures  Follow Up Recommendations  SNF       Equipment Recommendations  None recommended by OT       Frequency Min 2X/week   Progress towards OT Goals Progress towards OT goals: Progressing toward goals  Plan Discharge plan remains appropriate    Precautions / Restrictions Precautions Precautions: Fall Precaution Comments: Direct anterior hip no precautions; gangrnous toes - very painful Required Braces or Orthoses: Other Brace/Splint Other Brace/Splint: B PRAFO with wearing schedule of 2 hours on, 1 hour off--pt tolerating well per nursing on 12/18/2012 Restrictions Weight Bearing Restrictions: No RLE Weight Bearing: Weight bearing as tolerated       ADL  Toileting - Clothing Manipulation and Hygiene: Performed;+2 Total assistance Toileting - Clothing Manipulation and Hygiene: Patient Percentage: 30% Where Assessed - Toileting Clothing Manipulation and Hygiene:  (partial sit to stand from bed; pt incontinent, additional +1 to clean) Equipment Used: Gait belt;Rolling walker ADL Comments: Main focus today was for general mobility and sitting balance      OT Goals(current goals can now be found in the care plan section)    Visit Information  Last OT Received On: 12/18/12 Assistance Needed: +2 PT/OT Co-Evaluation/Treatment: Yes History of Present Illness: s/p elective R THA anterior approach, pt now with septic shock, UTI, acute kidney injury.  Pt with dry gangrene of bil feet.          Cognition  Cognition Arousal/Alertness: Lethargic Behavior During Therapy: Flat affect Overall Cognitive Status: Impaired/Different from baseline Orientation Level: Disoriented to;Place;Time;Situation Current Attention Level: Sustained Following Commands: Follows one step commands inconsistently;Follows one step commands with increased time Safety/Judgement: Decreased awareness of safety Problem Solving: Slow processing;Decreased initiation    Mobility  Bed Mobility Bed Mobility: Supine to Sit;Sitting - Scoot to [place] of Bed;Sit to Supine Supine to Sit: 1: +2 Total assist;HOB elevated Supine to Sit: Patient Percentage: 0% Sitting - Scoot to Edge of Bed: 1: +2 Total assist Sitting - Scoot to Edge of Bed: Patient Percentage: 0% Sit to Supine: 1: +2 Total assist;HOB flat Sit to Supine: Patient Percentage: 0% Transfers Transfers: Sit to Stand;Stand to Sit Sit to Stand: 1: +2 Total assist;Without upper extremity assist;From bed (partial stand for bed pan then peri care) Sit to Stand: Patient Percentage: 30% Stand to Sit: 1: +2 Total assist;Without upper extremity assist;To bed Stand to Sit: Patient Percentage: 30% Details for Transfer Assistance: Pt leaning more to the right in standing       Balance Static Sitting Balance Static Sitting - Balance Support: Bilateral upper extremity supported;Feet supported Static Sitting - Level of Assistance:  (initially max A progressing to min guard A) Static Sitting - Comment/# of Minutes: 12 minutes. Leaning to left and posteriorly at beginning   End of Session OT - End of Session Equipment Utilized During Treatment: Gait belt;Rolling walker Activity Tolerance: Patient limited by fatigue;Patient limited by lethargy Patient left: in bed (in chair position without PRAFOs--time to be off)       Evette Georges [phone] 12/18/2012, 9:09 AM

## 2012-12-18 NOTE — Progress Notes (Signed)
ANTICOAGULATION CONSULT NOTE - Follow Up Consult  Pharmacy Consult for heparin Indication: atrial fibrillation and ischemia of feet/hand  Labs:  Recent Labs  12/16/12 1730 12/16/12 1932 12/17/12 0430 12/17/12 1300 12/17/12 1900 12/17/12 2245 12/18/12 0500  HGB 7.6*  --  7.5*  --   --   --  8.5*  HCT 23.0*  --  23.0*  --   --   --  25.6*  PLT 358  --  339  --   --   --  411*  HEPARINUNFRC  --   --  0.95* 1.09*  --  0.83* 0.82*  CREATININE  --   --  2.01*  --  2.20*  --  2.33*  TROPONINI  --  <0.30  --   --   --   --   --      Assessment: 79yo female remains supratherapeutic on heparin with very little change in level despite rate decrease.  Goal of Therapy:  Heparin level 0.3-0.5 units/ml   Plan:  Will decrease heparin gtt by 2 units/kg/hr to 1800 units/hr and check level in 8hr.  Vernard Gambles, PharmD, BCPS  12/18/2012,5:41 AM

## 2012-12-18 NOTE — Progress Notes (Signed)
Physical Therapy Treatment Patient Details Name: Natalie Kent MRN: [id number] DOB: 26-Oct-1930 Today's Date: 12/18/2012 Time: 4098-1191 PT Time Calculation (min): 31 min  PT Assessment / Plan / Recommendation  History of Present Illness s/p elective R THA anterior approach, pt now with septic shock, UTI, acute kidney injury.  Pt with dry gangrene of bil feet.   Clinical Impression Pt with continued lethargy on pressors with medical complications limiting ability to progress at this point. Pt with inability to state orientation or HEP but educated for both. Total assist for pericare and use of bedpan EOB as unable to pivot to [employer] [facility] today. Will continue to follow and progress as medically able.    PT Comments     Follow Up Recommendations        Does the patient have the potential to tolerate intense rehabilitation     Barriers to Discharge        Equipment Recommendations       Recommendations for Other Services    Frequency     Progress towards PT Goals Progress towards PT goals: Not progressing toward goals - comment (increased medical complications limiting function/progressio)  Plan Current plan remains appropriate    Precautions / Restrictions Precautions Precautions: Fall Precaution Comments: Direct anterior hip no precautions; gangrnous toes - very painful Required Braces or Orthoses: Other Brace/Splint Other Brace/Splint: B PRAFO with wearing schedule of 2 hours on, 1 hour off--pt tolerating well per nursing on 12/18/2012 Restrictions Weight Bearing Restrictions: No RLE Weight Bearing: Weight bearing as tolerated   Pertinent Vitals/Pain Pt did not state pain BP low throughout on pressors with MAP higher than 60 throughout sats 99% on RA    Mobility  Bed Mobility Bed Mobility: Supine to Sit;Sitting - Scoot to [place] of Bed;Sit to Supine Rolling Right: 1: +1 Total assist Supine to Sit: 1: +2 Total assist;HOB elevated Supine to Sit: Patient Percentage: 0% Sitting -  Scoot to Edge of Bed: 1: +2 Total assist Sitting - Scoot to Edge of Bed: Patient Percentage: 0% Sit to Supine: 1: +2 Total assist;HOB flat Sit to Supine: Patient Percentage: 0% Details for Bed Mobility Assistance: total assist with use of pad to pivot to EOB, elevate trunk and return to supine. Initial sitting with heavy posterior left lean Transfers Transfers: Sit to Stand;Stand to Sit Sit to Stand: 1: +2 Total assist;From bed;With upper extremity assist Sit to Stand: Patient Percentage: 30% Stand to Sit: 1: +2 Total assist;To bed Stand to Sit: Patient Percentage: 30% Stand Pivot Transfers: Not tested (comment) Details for Transfer Assistance: max cueing and assist for anterior weight shift x 2 with pt unable to achieve fully upright with assist and cueing at trunk and pelvis. 2nd trial stood grossly for pericare after BM Ambulation/Gait Ambulation/Gait Assistance: Not tested (comment)    Exercises General Exercises - Lower Extremity Short Arc Quad: AAROM;10 reps;Right;Supine   PT Diagnosis:    PT Problem List:   PT Treatment Interventions:     PT Goals (current goals can now be found in the care plan section)    Visit Information  Last PT Received On: 12/18/12 Assistance Needed: +2 PT/OT Co-Evaluation/Treatment: Yes History of Present Illness: s/p elective R THA anterior approach, pt now with septic shock, UTI, acute kidney injury.  Pt with dry gangrene of bil feet.    Subjective Data      Cognition  Cognition Arousal/Alertness: Lethargic Behavior During Therapy: Flat affect Overall Cognitive Status: Impaired/Different from baseline Area of Impairment: Attention Orientation  Level: Disoriented to;Place;Time;Situation Current Attention Level: Sustained Memory: Decreased recall of precautions;Decreased short-term memory Following Commands: Follows one step commands inconsistently;Follows one step commands with increased time Safety/Judgement: Decreased awareness of  safety Problem Solving: Slow processing;Decreased initiation    Balance  Static Sitting Balance Static Sitting - Balance Support: Bilateral upper extremity supported;Feet supported Static Sitting - Level of Assistance: 2: Max assist Static Sitting - Comment/# of Minutes: 12 min with progression from max to minguard assist over entire time EOB, left posterior lean  End of Session PT - End of Session Equipment Utilized During Treatment: Gait belt Activity Tolerance: Patient limited by lethargy Patient left: in bed;with call bell/phone within reach Nurse Communication: Mobility status   GP     Delorse Lek 12/18/2012, 9:26 AM Delaney Meigs, PT [phone]

## 2012-12-18 NOTE — Progress Notes (Signed)
Panda unable to be placed at this time. Multiple attempts by 2 different nurses. MD Sood notified and we will readdress with day shift.   Doree Albee

## 2012-12-18 NOTE — Progress Notes (Signed)
Subjective: 16 Days Post-Op Procedure(s) (LRB): TOTAL HIP ARTHROPLASTY ANTERIOR APPROACH (Right) Dry gangrene toes bilateral .  Patient seen in ICU. Activity level:  She is currently at bedrest.  Partial weightbearing 50% right leg if up. Diet tolerance:  light Voiding:  Foley catheter Patient reports pain as 2 on 0-10 scale.    Objective: Vital signs in last 24 hours: Temp:  [97 F (36.1 C)-97.9 F (36.6 C)] 97 F (36.1 C) (07/24 1244) Pulse Rate:  [38-89] 75 (07/24 1300) Resp:  [14-25] 16 (07/24 1300) BP: (63-133)/(20-92) 133/90 mmHg (07/24 1300) SpO2:  [95 %-100 %] 99 % (07/24 1300) Weight:  [105.8 kg (233 lb 4 oz)] 105.8 kg (233 lb 4 oz) (07/24 0645)  Labs:  Recent Labs  12/16/12 0420 12/16/12 1730 12/17/12 0430 12/18/12 0500  HGB 8.1* 7.6* 7.5* 8.5*    Recent Labs  12/17/12 0430 12/18/12 0500  WBC 9.3 21.8*  RBC 2.57* 2.90*  HCT 23.0* 25.6*  PLT 339 411*    Recent Labs  12/17/12 1900 12/18/12 0500  NA 135 134*  K 3.7 3.8  CL 106 105  CO2 16* 15*  BUN 41* 43*  CREATININE 2.20* 2.33*  GLUCOSE 130* 106*  CALCIUM 8.4 8.4   No results found for this basename: LABPT, INR,  in the last 72 hours  Physical Exam:  Compartment soft Staples removed by nurse today .  Wound appears to be pretty well healed.  It still as clear or serous drainage.  No malodor, and no obvious sign of purulence.dressing is currently dry as it has been changed recently. Toes on both feet with dry gangrene.  No active sign of infection.  Assessment/Plan:  16 Days Post-Op Procedure(s) (LRB): TOTAL HIP ARTHROPLASTY ANTERIOR APPROACH (Right) Up with therapy and may be partial weightbearing right side 50%. Recent CT scan of the pelvis did show a greater trochanteric fracture with minimal to no displacement right hip.  This fracture is stable and this patient can be partial weightbearing 50% on the right side.this will need no surgical intervention.  The CT scan showed no abscess or  significant fluid accumulation around the right hip.toes on both feet continue to show signs of dry gangrene but no active sign of infection.  The continue to observe.  Dr. Turner Daniels will be on-call this weekend.    Natalie Kent R 12/18/2012, 1:51 PM

## 2012-12-18 NOTE — Progress Notes (Signed)
Morning PO medications not given r/t to increased work of breathing and decreased alertness of Pt.  Dr. Vassie Loll made aware.   Jacqulyn Cane RN, BSN, CCRN

## 2012-12-18 NOTE — Progress Notes (Signed)
ANTICOAGULATION CONSULT NOTE - Follow Up Consult  Pharmacy Consult for heparin Indication: atrial fibrillation and ischemia of feet/hand  Patient Measurements:  Height: 5\' 6"  (167.6 cm)  Weight: 213 lb 13.5 oz (97 kg)  IBW/kg (Calculated) : 59.3  Adjusted Body Weight: 74 kg  Labs:  Recent Labs  12/16/12 0420  12/16/12 1730 12/16/12 1932 12/17/12 0430 12/17/12 1300 12/17/12 1900 12/17/12 2245  HGB 8.1*  --  7.6*  --  7.5*  --   --   --   HCT 25.3*  --  23.0*  --  23.0*  --   --   --   PLT 381  --  358  --  339  --   --   --   HEPARINUNFRC  --   < >  --   --  0.95* 1.09*  --  0.83*  CREATININE 1.60*  --   --   --  2.01*  --  2.20*  --   TROPONINI  --   --   --  <0.30  --   --   --   --   < > = values in this interval not displayed.  Assessment: 79yo female on Heparin for afib/ischemia of phalanges. She is receiving IV heparin and level tonight (0.83) is above desired goal range of 0.5.   No bleeding noted, but Hgb is 7.5. Patient has required blood transfusion this admit; last on 7/19.  I spoke with this patients nurse who reports no bleeding and no IV complications with the heparin.  I discussed decreasing the IV heparin rate again with plans to f/u AM heparin level.  Of note, she has worsening renal function with upward trend in her creatinine.  Her crcl is now at 15ml/min.  I will go ahead and order a random level of her Vancomycin for tomorrow to ensure clearance in the setting of worsening renal function.  Goal of Therapy:  Heparin level 0.3-0.5   Plan:  1.  Decrease IV heparin rate to 1950 units/hr 2.  F/U AM heparin level and adjust accordingly 3.  Monitor CBC and s/s of bleeding complications  Nadara Mustard, PharmD., MS Clinical Pharmacist Pager:  [phone] Thank you for allowing pharmacy to be part of this patients care team. 12/18/2012 12:02 AM

## 2012-12-18 NOTE — Progress Notes (Signed)
NUTRITION FOLLOW UP/CONSULT  Intervention:    1. Initiate Vital AF 1.2 @ 15 ml/hr via NG tube and increase by 10 ml every 12 hours to goal rate of 65 ml/hr.   2. Monitor magnesium, potassium, and phosphorus daily for at least 3 days, MD to replete as needed, as pt is at risk for refeeding syndrome given prolonged time without nutrition. Ordered.   At goal rate, tube feeding regimen will provide 1872 kcal, 117 grams of protein, and 1265 ml of H2O.    Nutrition Dx:   Inadequate oral intake now related to lethargy as evidenced by pt not eating; ongoing.  Monitor:   TF tolerance, labs, weight trend  Assessment:   Pt presented for hip surgery.  Experience worsening abdominal pain and septic shock r/t pneumonia requiring intubation. Pt has since been extubated.  Pt was started on Dysphagia 1 diet but ate very poorly 0-40% of meals. Pt too lethargic to eat today per notes. Discussed with MD. Consult received to initiate nutrition support. Pt is at risk for refeeding syndrome due to prolonged time without adequate nutrition.   Height: Ht Readings from Last 1 Encounters:  12/04/12 5\' 6"  (1.676 m)    Weight Status:   Wt Readings from Last 1 Encounters:  12/18/12 233 lb 4 oz (105.8 kg)  Admission weight 167 lb  (weight range 167-233 lb)  Re-estimated needs:  Kcal: 1880-2070 Protein: 75-93g Fluid: >1.8 L/day  Skin: skin tears bilateral arms, hip incision  Diet Order: Dysphagia 1 with Thin Liquids Meal Completion: 0-40%   Intake/Output Summary (Last 24 hours) at 12/18/12 1537 Last data filed at 12/18/12 1400  Gross per 24 hour  Intake 2670.58 ml  Output     38 ml  Net 2632.58 ml    Last BM: 7/23   Labs:   Recent Labs Lab 12/14/12 0300  12/17/12 0430 12/17/12 1900 12/18/12 0500  NA 140  < > 139 135 134*  K 3.3*  < > 3.9 3.7 3.8  CL 110  < > 111 106 105  CO2 20  < > 16* 16* 15*  BUN 31*  < > 38* 41* 43*  CREATININE 1.24*  < > 2.01* 2.20* 2.33*  CALCIUM 8.5  < >  8.4 8.4 8.4  MG 1.9  --  1.9  --   --   PHOS  --   --  5.4*  --   --   GLUCOSE 77  < > 107* 130* 106*  < > = values in this interval not displayed.  CBG (last 3)   Recent Labs  12/16/12 1839 12/16/12 1917  GLUCAP 68* 111*    Scheduled Meds: . ipratropium  0.5 mg Nebulization Q4H   And  . albuterol  2.5 mg Nebulization Q4H  . antiseptic oral rinse  15 mL Mouth Rinse q12n4p  . chlorhexidine  15 mL Mouth Rinse BID  . [START ON 01/01/2013] cyanocobalamin  1,000 mcg Intramuscular Q30 days  . docusate sodium  100 mg Oral BID  . feeding supplement  237 mL Oral TID BM  . hydrocortisone sodium succinate  50 mg Intravenous Q6H  . pantoprazole (PROTONIX) IV  40 mg Intravenous QHS  . piperacillin-tazobactam (ZOSYN)  IV  3.375 g Intravenous Q8H    Continuous Infusions: . sodium chloride 10 mL/hr (12/16/12 1658)  . sodium chloride 20 mL/hr (12/17/12 2355)  . dextrose Stopped (12/15/12 0800)  . feeding supplement (VITAL AF 1.2 CAL)    . heparin 1,800 Units/hr (12/18/12  1210)  . phenylephrine (NEO-SYNEPHRINE) Adult infusion 80 mcg/min (12/18/12 1307)    Loyce Dys, MS RD LDN Clinical Inpatient Dietitian Pager: [phone] Weekend/After hours pager: [phone]

## 2012-12-18 NOTE — Progress Notes (Signed)
PULMONARY  / CRITICAL CARE MEDICINE  Name: Natalie Kent MRN: [id number] DOB: [birth year]    ADMISSION DATE:  12/11/2012 CONSULTATION DATE:  12/04/2012  REFERRING MD : Marcene Corning  CHIEF COMPLAINT:  Rt hip pain  BRIEF PATIENT DESCRIPTION:  79 yo female with arthritis and Rt hip pain.  Had Rt total hip arthroplasty.  Developed altered mental status, abdominal discomfort and hypotension POD#1.  PCCM consulted 7/10 , required mechanical ventilation until 7/15, course complicated by gangrene of toes due to pressors. PCCM reconsulted 7/22 for hypotension, AKI, presumed ileus vs abdominal sepsis  SIGNIFICANT EVENTS: 7/08 Rt total hip arthroplasty 7/09 Change mental status, acute renal failure 7/10 VDRF 7/15 Extubated 7/16 Still no bowel movement, wheezing on exam 7/17 VVS consult for lower extremity digital ischemia 7/22 transferred to ICU for hypotension, AKI, ileus vs abdominal sepsis  STUDIES:  7/09 CT head >> no acute findings.  Prior Lt MCA infarct and  left MCA M1 segment stent. 7/09 Abd u/s >> no acute findings 7/09 CT abd/pelvis >> stool filled colon, anterior abdominal wall hernia 7/10 Echo >> mild LVH, EF 65 to 70%, grade 1 diastolic dysfx, mild LA dilation 7/15 ABD xray >> relatively large stool burden 7/17 ABI's 7/23 stool filled trasnverse colon, hip ok  LINES / TUBES: 7/10 Lt IJ CVL >>  7/10 ETT >>7/15  7/10 Rt radial aline >> out  CULTURES: 7/09 Urine >> negative 7/09 Blood >> no growth 7/09 Sputum >> Yeast 7/22 blood>>>ngtd 7/22 urine>>> no growth final 7/22 sputum>>>  ANTIBIOTICS: Vancomycin 7/08 >> 7/18 Imipenem 7/09 >> 7/18 Vancomycin 7/22>>> Zosyn 7/22>>>  SUBJECTIVE:  CT yesterday showed no hip source of sepsis Distended abdomen with stool, lots of abdominal wall edema as well Afebrile Remains on neo gtt Poor PO intake  VITAL SIGNS: Temp:  [97.4 F (36.3 C)-98 F (36.7 C)] 97.9 F (36.6 C) (07/24 0400) Pulse Rate:  [38-95] 68 (07/24  0800) Resp:  [14-25] 15 (07/24 0800) BP: (55-117)/(20-92) 88/58 mmHg (07/24 0800) SpO2:  [95 %-100 %] 99 % (07/24 0905) Weight:  [233 lb 4 oz (105.8 kg)] 233 lb 4 oz (105.8 kg) (07/24 0645) 5 liters [place]  HEMODYNAMICS: CVP:  [7 mmHg-15 mmHg] 14 mmHg  INTAKE / OUTPUT: Intake/Output     07/23 0701 - 07/24 0700 07/24 0701 - 07/25 0700   P.O. 90    I.V. (mL/kg) 3278.8 (31) 75.5 (0.7)   Other     NG/GT 350    IV Piggyback 400    Total Intake(mL/kg) 4118.8 (38.9) 75.5 (0.7)   Urine (mL/kg/hr) 68 (0)    Total Output 68 0   Net +4050.8 +75.5        Stool Occurrence 3 x     PHYSICAL EXAMINATION: General: Elderly ill appearing female Neuro: lying with eyes closed, not very responsive HEENT: NCAT Cardiovascular: irregularly irregular Lungs: bilateral breath sounds present, wheezing audible from bedside Abdomen: soft, tender, distended, copious abdominal wall edema Musculoskeletal: bilateral feet have black toes, edema in legs Skin: black toes bilaterally  LABS:  Recent Labs Lab 12/13/12 1051  12/14/12 0300 12/15/12 0400 12/16/12 0420  12/16/12 1730 12/16/12 1750 12/16/12 1800 12/16/12 1816 12/16/12 1932 12/17/12 0430 12/17/12 0655 12/17/12 1900 12/18/12 0450 12/18/12 0500  HGB  --   < > 9.0* 8.2* 8.1*  --  7.6*  --   --   --   --  7.5*  --   --   --  8.5*  WBC  --   < >  9.3 7.4 7.9  --  7.5  --   --   --   --  9.3  --   --   --  21.8*  PLT  --   < > 281 316 381  --  358  --   --   --   --  339  --   --   --  411*  NA  --   --  140 141 139  --   --   --   --   --   --  139  --  135  --  134*  K  --   --  3.3* 3.5 3.6  --   --   --   --   --   --  3.9  --  3.7  --  3.8  CL  --   --  110 110 110  --   --   --   --   --   --  111  --  106  --  105  CO2  --   --  20 21 19   --   --   --   --   --   --  16*  --  16*  --  15*  GLUCOSE  --   --  77 74 72  --   --   --   --   --   --  107*  --  130*  --  106*  BUN  --   --  31* 32* 33*  --   --   --   --   --   --  38*  --  41*   --  43*  CREATININE  --   --  1.24* 1.35* 1.60*  --   --   --   --   --   --  2.01*  --  2.20*  --  2.33*  CALCIUM  --   --  8.5 8.5 8.6  --   --   --   --   --   --  8.4  --  8.4  --  8.4  MG  --   --  1.9  --   --   --   --   --   --   --   --  1.9  --   --   --   --   PHOS  --   --   --   --   --   --   --   --   --   --   --  5.4*  --   --   --   --   AST  --   --  25 18 19   --   --   --   --   --   --   --   --   --   --   --   ALT  --   --  10 9 9   --   --   --   --   --   --   --   --   --   --   --   ALKPHOS  --   --  89 79 83  --   --   --   --   --   --   --   --   --   --   --  BILITOT  --   --  0.8 0.6 0.5  --   --   --   --   --   --   --   --   --   --   --   PROT  --   --  4.5* 4.3* 4.4*  --   --   --   --   --   --   --   --   --   --   --   ALBUMIN  --   --  1.3* 1.1* 1.2*  --   --   --   --   --   --   --   --   --   --   --   LATICACIDVEN  --   --   --   --   --   < > 1.1 1.1  --   --   --   --  1.0  --  1.0  --   TROPONINI  --   --   --   --   --   --   --   --   --   --  <0.30  --   --   --   --   --   PROCALCITON  --   --   --   --   --   --   --   --  1.02  --   --   --   --   --   --  1.04  PHART 7.373  --   --   --   --   --   --   --   --  7.348*  --   --   --   --   --   --   PCO2ART 31.5*  --   --   --   --   --   --   --   --  29.2*  --   --   --   --   --   --   PO2ART 84.0  --   --   --   --   --   --   --   --  83.4  --   --   --   --   --   --   < > = values in this interval not displayed.  Recent Labs Lab 12/11/12 1238 12/11/12 1737 12/16/12 1839 12/16/12 1917  GLUCAP 71 79 68* 111*   CXR: improvement in aeration  ASSESSMENT / PLAN:  PULMONARY A: Acute respiratory failure 2nd to HAP - Hx of COPD/asthma. Wheezing, anticipate severe pulmonary edema and impending respiratory failure with hypotension and volume resuscitation. P:   - F/u CXR in AM. - Oxygen to keep SpO2 > 92% - Bronchial hygiene - albuterol nebs, d/c MDI's including spiriva - Hold  singulair for now. - DNI status  CARDIOVASCULAR A: Septic shock 2nd to pneumonia >> resolved. Hx of HTN. Atrial fibrillation B/l digital ischemia of feet and Rt hand >>  Related to pressors, no PAD , no embolic source Now in septic shock, doubt PE since was on heparin. P:  - Continue heparin gtt per pharmacy - CVP q4. - Stress dose steroids. - on neo, goal sbp 80, max dose 100 - Limited code - no cpr or cardioversion - will need amputation of toes at some point  RENAL A: Acute kidney injury >> deteriorating with hypotension. Hypokalemia >>  improved. Non gap metabolic acidosis >> resolved. P:   - will give dose of lasix 80mg  now in attempt to convert to non oliguric (monitor BP closely, however) - Monitor CVP. - Avoid nephrotoxic agents - Trend BMET - Avoid contrast  GASTROINTESTINAL A: Constipation >> improved 7/17 with SMOG enema. Dysphagia. Abdominal wall hernia >> CT findings d/w radiology >> likely benign findings. Hx of GERD. P:   - liquid diet if awake enough to eat otherwise place NGT - SUP, protonix. - f/u cdiff - d/c PO meds, need to to speak with pharmacy about renal dosing of meds, and switching to IV medications   HEMATOLOGIC A: Anemia of chronic illness/iron deficiency -  PRBC transfusion on 7/08, 7/09, 7/14. 7/17. Thrombocytopenia - resolved Ischemic toes P:  - F/U CBC - Transfuse for Hb < 7 - on heparin gtt for ischemic toes, although arterial dopplers showed no PAD  INFECTIOUS A: HAP >> resolved. Septic shock likely, source unclear P:   - continue vanc/zosyn. - F/U on cultures.  ENDOCRINE A: Mild hyperglycemia >> no hx of DM. P:   - ISS. - CBGs as ordered.  NEUROLOGIC A: Acute encephalopathy - 2/2 to sepsis, hypoxia, and pain meds >> improved 7/18. Hx of CVA, depression, fibromyalgia, anxiety. Post-op pain control. Pain from lower extremity ischemia. P:   - Avoid benzo's. - d/c oxycodone, give prn fentanyl  ORTHOPEDIC A:  Arthritis s/p Rt hip arthroplasty. P: - Post-op care per ortho - PT/OT   GLOBAL - limited interventions. No HD or surgery, neo cap at 100. Ongoing discussion of palliative options with family - will involve palliative care  Levert Feinstein, MD Family Medicine PGY-2  The patient is critically ill with multiple organ systems failure and requires high complexity decision making for assessment and support, frequent evaluation and titration of therapies, application of advanced monitoring technologies and extensive interpretation of multiple databases. Critical Care Time devoted to patient care services described in this note is 35 minutes.   ALVA,RAKESH V.

## 2012-12-19 ENCOUNTER — Inpatient Hospital Stay (HOSPITAL_COMMUNITY): Payer: Medicare Other

## 2012-12-19 LAB — BASIC METABOLIC PANEL
CO2: 15 mEq/L — ABNORMAL LOW (ref 19–32)
Calcium: 8.5 mg/dL (ref 8.4–10.5)
Chloride: 104 mEq/L (ref 96–112)
Creatinine, Ser: 2.76 mg/dL — ABNORMAL HIGH (ref 0.50–1.10)
GFR calc Af Amer: 18 mL/min — ABNORMAL LOW (ref >90)
Sodium: 133 mEq/L — ABNORMAL LOW (ref 135–145)

## 2012-12-19 LAB — CULTURE, BLOOD (ROUTINE X 2): Culture: NO GROWTH

## 2012-12-19 LAB — MAGNESIUM: Magnesium: 2.2 mg/dL (ref 1.5–2.5)

## 2012-12-19 LAB — VANCOMYCIN, RANDOM: Vancomycin Rm: 42.4 ug/mL

## 2012-12-19 LAB — CBC
Hemoglobin: 7.9 g/dL — ABNORMAL LOW (ref 12.0–15.0)
MCH: 29 pg (ref 26.0–34.0)
MCHC: 32.8 g/dL (ref 30.0–36.0)
Platelets: 389 10*3/uL (ref 150–400)
RBC: 2.72 MIL/uL — ABNORMAL LOW (ref 3.87–5.11)

## 2012-12-19 LAB — HEPARIN LEVEL (UNFRACTIONATED): Heparin Unfractionated: 0.94 IU/mL — ABNORMAL HIGH (ref 0.30–0.70)

## 2012-12-19 LAB — GLUCOSE, CAPILLARY: Glucose-Capillary: 103 mg/dL — ABNORMAL HIGH (ref 70–99)

## 2012-12-19 LAB — PHOSPHORUS: Phosphorus: 6.6 mg/dL — ABNORMAL HIGH (ref 2.3–4.6)

## 2012-12-19 MED ORDER — IOHEXOL 300 MG/ML  SOLN
50.0000 mL | Freq: Once | INTRAMUSCULAR | Status: AC | PRN
  Administered 2012-12-19: 20 mL

## 2012-12-19 MED ORDER — DOCUSATE SODIUM 50 MG/5ML PO LIQD
100.0000 mg | Freq: Two times a day (BID) | ORAL | Status: DC
  Administered 2012-12-19 – 2012-12-21 (×4): 100 mg
  Filled 2012-12-19 (×7): qty 10

## 2012-12-19 MED ORDER — FENTANYL CITRATE 0.05 MG/ML IJ SOLN
INTRAMUSCULAR | Status: AC
  Administered 2012-12-19: 100 ug
  Filled 2012-12-19: qty 2

## 2012-12-19 MED ORDER — FENTANYL CITRATE 0.05 MG/ML IJ SOLN
25.0000 ug | INTRAMUSCULAR | Status: DC | PRN
  Administered 2012-12-20: 50 ug via INTRAVENOUS
  Filled 2012-12-19 (×2): qty 2

## 2012-12-19 MED ORDER — PANTOPRAZOLE SODIUM 40 MG PO PACK
40.0000 mg | PACK | Freq: Every day | ORAL | Status: DC
  Administered 2012-12-19 – 2012-12-21 (×3): 40 mg
  Filled 2012-12-19 (×4): qty 20

## 2012-12-19 NOTE — Progress Notes (Signed)
ANTIBIOTIC CONSULT NOTE  Pharmacy Consult for Vancomycin and Zosyn Indication:  Sepsis-unclear source   Allergies  Allergen Reactions  . Solarcaine Aloe Extra (Lidocaine Hcl)   . Septra (Bactrim)   . Cephalexin Rash   Patient Measurements: Height: 5\' 6"  (167.6 cm) Weight: 227 lb 1.2 oz (103 kg) IBW/kg (Calculated) : 59.3 Adjusted Body Weight:  74 kg  Vital Signs: Temp: 97.5 F (36.4 C) (07/25 0407) Temp src: Oral (07/25 0407) BP: 92/67 mmHg (07/25 0600) Pulse Rate: 58 (07/25 0600) Intake/Output from previous day: 07/24 0701 - 07/25 0700 In: 1524.2 [I.V.:1411.7; IV Piggyback:112.5] Out: 240 [Urine:240]  Labs:  Recent Labs  12/17/12 0430 12/17/12 1900 12/18/12 0500 12/19/12 0445  WBC 9.3  --  21.8* 18.1*  HGB 7.5*  --  8.5* 7.9*  PLT 339  --  411* 389  CREATININE 2.01* 2.20* 2.33* 2.76*   Estimated Creatinine Clearance: 20 ml/min (by C-G formula based on Cr of 2.76).  Recent Labs  12/18/12 0800 12/19/12 0445  VANCORANDOM 42.8 42.4    Microbiology: Recent Results (from the past 720 hour(s))  SURGICAL PCR SCREEN     Status: Abnormal   Collection Time    11/25/12  2:37 PM      Result Value Range Status   MRSA, PCR POSITIVE (*) NEGATIVE Final   Staphylococcus aureus POSITIVE (*) NEGATIVE Final   Comment:            The Xpert SA Assay (FDA     approved for NASAL specimens     in patients over 26 years of age),     is one component of     a comprehensive surveillance     program.  Test performance has     been validated by [employer] for patients greater     than or equal to 6 year old.     It is not intended     to diagnose infection nor to     guide or monitor treatment.  URINE CULTURE     Status: None   Collection Time    11/25/12  2:37 PM      Result Value Range Status   Specimen Description URINE, CLEAN CATCH   Final   Special Requests NONE   Final   Culture  Setup Time 11/25/2012 15:39   Final   Colony Count 80,000 COLONIES/ML   Final    Culture     Final   Value: GROUP B STREP(S.AGALACTIAE)ISOLATED     Note: TESTING AGAINST S. AGALACTIAE NOT ROUTINELY PERFORMED DUE TO PREDICTABILITY OF AMP/PEN/VAN SUSCEPTIBILITY.   Report Status 11/26/2012 FINAL   Final  URINE CULTURE     Status: None   Collection Time    12/03/12 11:16 AM      Result Value Range Status   Specimen Description URINE, RANDOM   Final   Special Requests NONE   Final   Culture  Setup Time 12/03/2012 17:13   Final   Colony Count NO GROWTH   Final   Culture NO GROWTH   Final   Report Status 12/04/2012 FINAL   Final  CULTURE, BLOOD (ROUTINE X 2)     Status: None   Collection Time    12/03/12  4:40 PM      Result Value Range Status   Specimen Description BLOOD LEFT ARM   Final   Special Requests BOTTLES DRAWN AEROBIC ONLY 5CC   Final   Culture  Setup Time 12/04/2012 00:10  Final   Culture NO GROWTH 5 DAYS   Final   Report Status 12/10/2012 FINAL   Final  CULTURE, BLOOD (ROUTINE X 2)     Status: None   Collection Time    12/03/12  4:45 PM      Result Value Range Status   Specimen Description BLOOD LEFT HAND   Final   Special Requests BOTTLES DRAWN AEROBIC ONLY 5CC   Final   Culture  Setup Time 12/04/2012 00:11   Final   Culture NO GROWTH 5 DAYS   Final   Report Status 12/10/2012 FINAL   Final  CULTURE, BAL-QUANTITATIVE     Status: None   Collection Time    12/05/12  3:06 PM      Result Value Range Status   Specimen Description BRONCHIAL ALVEOLAR LAVAGE   Final   Special Requests NONE   Final   Gram Stain     Final   Value: NO WBC SEEN     NO SQUAMOUS EPITHELIAL CELLS SEEN     NO ORGANISMS SEEN   Colony Count 25,000 COLONIES/ML   Final   Culture YEAST   Final   Report Status 12/07/2012 FINAL   Final  CULTURE, BLOOD (ROUTINE X 2)     Status: None   Collection Time    12/13/12  2:27 PM      Result Value Range Status   Specimen Description BLOOD RIGHT HAND   Final   Special Requests BOTTLES DRAWN AEROBIC ONLY 3CC   Final   Culture  Setup  Time 12/13/2012 20:32   Final   Culture     Final   Value:        BLOOD CULTURE RECEIVED NO GROWTH TO DATE CULTURE WILL BE HELD FOR 5 DAYS BEFORE ISSUING A FINAL NEGATIVE REPORT   Report Status PENDING   Incomplete  CULTURE, BLOOD (ROUTINE X 2)     Status: None   Collection Time    12/13/12  2:31 PM      Result Value Range Status   Specimen Description BLOOD BLOOD LEFT FOREARM   Final   Special Requests BOTTLES DRAWN AEROBIC AND ANAEROBIC 8CC   Final   Culture  Setup Time 12/13/2012 20:32   Final   Culture     Final   Value:        BLOOD CULTURE RECEIVED NO GROWTH TO DATE CULTURE WILL BE HELD FOR 5 DAYS BEFORE ISSUING A FINAL NEGATIVE REPORT   Report Status PENDING   Incomplete  CLOSTRIDIUM DIFFICILE BY PCR     Status: None   Collection Time    12/13/12  7:44 PM      Result Value Range Status   C difficile by pcr NEGATIVE  NEGATIVE Final  CULTURE, BLOOD (ROUTINE X 2)     Status: None   Collection Time    12/16/12  7:45 PM      Result Value Range Status   Specimen Description BLOOD ARM LEFT   Final   Special Requests BOTTLES DRAWN AEROBIC AND ANAEROBIC 10CC EACH   Final   Culture  Setup Time 12/17/2012 01:24   Final   Culture     Final   Value:        BLOOD CULTURE RECEIVED NO GROWTH TO DATE CULTURE WILL BE HELD FOR 5 DAYS BEFORE ISSUING A FINAL NEGATIVE REPORT   Report Status PENDING   Incomplete  CULTURE, BLOOD (ROUTINE X 2)     Status: None  Collection Time    12/16/12  7:55 PM      Result Value Range Status   Specimen Description BLOOD HAND RIGHT   Final   Special Requests     Final   Value: BOTTLES DRAWN AEROBIC AND ANAEROBIC 10CC AEROBIC 5CC ANAEROBIC   Culture  Setup Time 12/17/2012 01:23   Final   Culture     Final   Value:        BLOOD CULTURE RECEIVED NO GROWTH TO DATE CULTURE WILL BE HELD FOR 5 DAYS BEFORE ISSUING A FINAL NEGATIVE REPORT   Report Status PENDING   Incomplete  URINE CULTURE     Status: None   Collection Time    12/16/12 11:01 PM      Result Value  Range Status   Specimen Description URINE, CATHETERIZED   Final   Special Requests NONE   Final   Culture  Setup Time 12/16/2012 23:25   Final   Colony Count NO GROWTH   Final   Culture NO GROWTH   Final   Report Status 12/18/2012 FINAL   Final  CLOSTRIDIUM DIFFICILE BY PCR     Status: None   Collection Time    12/18/12  8:56 AM      Result Value Range Status   C difficile by pcr NEGATIVE  NEGATIVE Final   Assessment: Vanc and Zosyn re-ordered for sepsis of unknown source.  Day 4 of Zosyn and Day 16 of Vanc. Afeb. WBC trending down now 18.1K but still elevated. Per vascular- toes not source of sepsis. LA 1 yesterday. CT R hip not source of infx.   Repeat random Vanc - 42.4 which is essentially unchanged.  She has worsening renal function with creatinine trending up (2.76 this AM). This clearly indicates that she is holding on to the drug and not clearing it appropriately.   Goal of Therapy:  Vancomycin trough level 15-20 mcg/ml  Plan:  1.  Will continue to hold Vancomycing and re-check random level in 48 hours. 2.  Will continue Zosyn 3.375 g IV q8h (4h infusion) but may need to drop dose to 2.25gm if she continues to have little/no UOP. 3.  Monitor clinical status, renal function and culture data daily.   Nadara Mustard, PharmD., MS Clinical Pharmacist Pager:  [phone] Thank you for allowing pharmacy to be part of this patients care team. 12/19/2012 7:22 AM

## 2012-12-19 NOTE — Progress Notes (Signed)
ANTICOAGULATION CONSULT NOTE - Follow Up Consult  Pharmacy Consult for Heparin Indication: atrial fibrillation, ischemia of extremities   Allergies  Allergen Reactions  . Solarcaine Aloe Extra (Lidocaine Hcl)   . Septra (Bactrim)   . Cephalexin Rash    Patient Measurements: Height: 5\' 6"  (167.6 cm) Weight: 227 lb 1.2 oz (103 kg) IBW/kg (Calculated) : 59.3 Heparin Dosing Weight: 74 kg  Vital Signs: Temp: 97.4 F (36.3 C) (07/25 1211) Temp src: Oral (07/25 1211) BP: 82/35 mmHg (07/25 1500) Pulse Rate: 74 (07/25 1500)  Labs:  Recent Labs  12/16/12 1932  12/17/12 0430  12/17/12 1900  12/18/12 0500 12/18/12 1545 12/19/12 0445 12/19/12 1520  HGB  --   --  7.5*  --   --   --  8.5*  --  7.9*  --   HCT  --   --  23.0*  --   --   --  25.6*  --  24.1*  --   PLT  --   --  339  --   --   --  411*  --  389  --   HEPARINUNFRC  --   --  0.95*  < >  --   < > 0.82* 0.54 0.94* 0.46  CREATININE  --   < > 2.01*  --  2.20*  --  2.33*  --  2.76*  --   TROPONINI <0.30  --   --   --   --   --   --   --   --   --   < > = values in this interval not displayed.  Estimated Creatinine Clearance: 20 ml/min (by C-G formula based on Cr of 2.76).   Medications:  Scheduled:  . ipratropium  0.5 mg Nebulization Q4H   And  . albuterol  2.5 mg Nebulization Q4H  . antiseptic oral rinse  15 mL Mouth Rinse q12n4p  . chlorhexidine  15 mL Mouth Rinse BID  . [START ON 01/01/2013] cyanocobalamin  1,000 mcg Intramuscular Q30 days  . docusate  100 mg Per Tube BID  . hydrocortisone sodium succinate  50 mg Intravenous Q6H  . pantoprazole sodium  40 mg Per Tube QHS  . piperacillin-tazobactam (ZOSYN)  IV  3.375 g Intravenous Q8H    Assessment: 79 y/o F with afib and ischemic extremities on heparin drip. HL is 0.46<0.94, CBC low and stable, no overt bleeding noted. Scr 2.76<2.33 with CrCl ~ 20.   Goal of Therapy:  Heparin level 0.3-0.5 Monitor platelets by anticoagulation protocol: Yes   Plan:   -Continue heparin infusion at 1400 units/hr -8 hour HL at 2300 -Daily CBC/HL -Monitor for bleeding  Thank you for allowing me to take part in this patient's care,  Abran Duke, PharmD Clinical Pharmacist Phone: [phone] Pager: [phone] 12/19/2012 4:01 PM

## 2012-12-19 NOTE — Progress Notes (Signed)
Physical Therapy Treatment Patient Details Name: Natalie Kent MRN: [id number] DOB: 06-26-1930 Today's Date: 12/19/2012 Time: 2440-1027 PT Time Calculation (min): 31 min  PT Assessment / Plan / Recommendation  History of Present Illness s/p elective R THA anterior approach, pt now with septic shock, UTI, acute kidney injury.  Pt with dry gangrene of bil feet. New femur fx R as of 7/24   Clinical Impression Pt continues to assist with sitting balance and transfers into standing but limited ability with bed mobility and transfers to and from supine. Will continue to follow with decreased frequency due to impaired cognition and limited participation at this time, noted plan for palliative mtg Monday and will follow also based on family decisions.    PT Comments     Follow Up Recommendations        Does the patient have the potential to tolerate intense rehabilitation     Barriers to Discharge        Equipment Recommendations       Recommendations for Other Services    Frequency Min 2X/week   Progress towards PT Goals Progress towards PT goals: Goals downgraded-see care plan  Plan Frequency needs to be updated    Precautions / Restrictions Precautions Precautions: Fall Precaution Comments: Direct anterior hip no precautions; gangrnous toes - very painful Other Brace/Splint: B PRAFO with wearing schedule of 2 hours on, 1 hour off Restrictions Weight Bearing Restrictions: Yes RLE Weight Bearing: Partial weight bearing RLE Partial Weight Bearing Percentage or Pounds: 50   Pertinent Vitals/Pain BP 101/71 (76) sitting EOB 90/48 (56) with return to supine 99% on RA    Mobility  Bed Mobility Bed Mobility: Supine to Sit;Sitting - Scoot to [place] of Bed;Sit to Supine Rolling Right: 1: +1 Total assist Supine to Sit: 1: +2 Total assist;HOB elevated Supine to Sit: Patient Percentage: 0% Sitting - Scoot to Edge of Bed: 1: +2 Total assist Sitting - Scoot to Edge of Bed: Patient  Percentage: 0% Sit to Supine: 1: +2 Total assist;HOB flat Sit to Supine: Patient Percentage: 0% Details for Bed Mobility Assistance: total assist with use of pad to pivot to EOB, elevate trunk and return to supine. Initial sitting with heavy posterior lean with improvement with pt reaching for PT hands and for RW Transfers Transfers: Sit to Stand;Stand to Sit Sit to Stand: 1: +2 Total assist;From bed;With upper extremity assist;From elevated surface Sit to Stand: Patient Percentage: 30% Stand to Sit: 1: +2 Total assist;To bed Stand to Sit: Patient Percentage: 30% Stand Pivot Transfers: Not tested (comment) Details for Transfer Assistance: max cueing and assist for anterior weight shift x 2 with pt unable to achieve fully upright with assist and cueing at trunk and pelvis. grossly 1 min each trial for pericare due to incontinent bowels  Ambulation/Gait Ambulation/Gait Assistance: Not tested (comment)    Exercises General Exercises - Lower Extremity Short Arc QuadBarbaraann Boys;Both;10 reps;Supine   PT Diagnosis:    PT Problem List:   PT Treatment Interventions:     PT Goals (current goals can now be found in the care plan section) Acute Rehab PT Goals PT Goal Formulation: Patient unable to participate in goal setting Time For Goal Achievement: 01/02/13 Potential to Achieve Goals: Fair  Visit Information  Last PT Received On: 12/19/12 Assistance Needed: +2 PT/OT Co-Evaluation/Treatment: Yes History of Present Illness: s/p elective R THA anterior approach, pt now with septic shock, UTI, acute kidney injury.  Pt with dry gangrene of bil feet. New femur fx  R as of 7/24    Subjective Data      Cognition  Cognition Arousal/Alertness: Lethargic Behavior During Therapy: Flat affect Overall Cognitive Status: Impaired/Different from baseline Orientation Level: Disoriented to;Time;Place Current Attention Level: Sustained Memory: Decreased short-term memory;Decreased recall of  precautions Following Commands: Follows one step commands inconsistently;Follows one step commands with increased time Problem Solving: Slow processing;Decreased initiation    Balance     End of Session PT - End of Session Equipment Utilized During Treatment: Gait belt Activity Tolerance: Patient limited by lethargy Patient left: in bed;with call bell/phone within reach;with nursing/sitter in room Nurse Communication: Mobility status   GP     Delorse Lek 12/19/2012, 12:28 PM Delaney Meigs, PT [phone]

## 2012-12-19 NOTE — Progress Notes (Signed)
PULMONARY  / CRITICAL CARE MEDICINE  Name: Natalie Kent MRN: [id number] DOB: 10-15-1930    ADMISSION DATE:  12/06/2012 CONSULTATION DATE:  12/04/2012  REFERRING MD : Marcene Corning  CHIEF COMPLAINT:  Rt hip pain  BRIEF PATIENT DESCRIPTION:  79 yo female with arthritis and Rt hip pain.  Had Rt total hip arthroplasty.  Developed altered mental status, abdominal discomfort and hypotension POD#1.  PCCM consulted 7/10 , required mechanical ventilation until 7/15, course complicated by gangrene of toes due to pressors. PCCM reconsulted 7/22 for hypotension, AKI, presumed ileus vs abdominal sepsis  SIGNIFICANT EVENTS: 7/08 Rt total hip arthroplasty 7/09 Change mental status, acute renal failure 7/10 VDRF 7/15 Extubated 7/16 Still no bowel movement, wheezing on exam 7/17 VVS consult for lower extremity digital ischemia 7/22 transferred to ICU for hypotension, AKI, ileus vs abdominal sepsis  STUDIES:  7/09 CT head >> no acute findings.  Prior Lt MCA infarct and  left MCA M1 segment stent. 7/09 Abd u/s >> no acute findings 7/09 CT abd/pelvis >> stool filled colon, anterior abdominal wall hernia 7/10 Echo >> mild LVH, EF 65 to 70%, grade 1 diastolic dysfx, mild LA dilation 7/15 ABD xray >> relatively large stool burden 7/17 ABI's 7/23 stool filled trasnverse colon, hip ok  LINES / TUBES: 7/10 Lt IJ CVL >>  7/10 ETT >>7/15  7/10 Rt radial aline >> out  CULTURES: 7/09 Urine >> negative 7/09 Blood >> no growth 7/09 Sputum >> Yeast 7/22 blood>>>ngtd 7/22 urine>>> no growth final 7/22 sputum>>>  ANTIBIOTICS: Vancomycin 7/08 >> 7/18 Imipenem 7/09 >> 7/18 Vancomycin 7/22>>>7/25 Zosyn 7/22>>>  SUBJECTIVE:  Afebrile Remains on neo gtt, have tapered off some Poor urine output despite lasix 80mg  IV yesterday PM  VITAL SIGNS: Temp:  [97 F (36.1 C)-97.7 F (36.5 C)] 97.4 F (36.3 C) (07/25 1211) Pulse Rate:  [39-107] 85 (07/25 1200) Resp:  [12-23] 14 (07/25 1200) BP:  (70-122)/(37-97) 90/48 mmHg (07/25 1200) SpO2:  [85 %-100 %] 100 % (07/25 1234) Weight:  [227 lb 1.2 oz (103 kg)] 227 lb 1.2 oz (103 kg) (07/25 0056) 5 liters [place]  HEMODYNAMICS: CVP:  [12 mmHg-15 mmHg] 12 mmHg  INTAKE / OUTPUT: Intake/Output     07/24 0701 - 07/25 0700 07/25 0701 - 07/26 0700   P.O.     I.V. (mL/kg) 1460 (14.2) 282.5 (2.7)   NG/GT     IV Piggyback 112.5    Total Intake(mL/kg) 1572.5 (15.3) 282.5 (2.7)   Urine (mL/kg/hr) 240 (0.1) 120 (0.2)   Total Output 240 120   Net +1332.5 +162.5        Stool Occurrence 1 x     PHYSICAL EXAMINATION: General: Elderly ill appearing female Neuro: lying with eyes closed, not very responsive HEENT: NCAT Cardiovascular: RRR Lungs: bilateral breath sounds present, less wheezing than yesterday Abdomen: soft, tender, distended, copious abdominal wall edema Musculoskeletal: bilateral feet have black toes Skin: black toes bilaterally  LABS:  Recent Labs Lab 12/13/12 1051  12/14/12 0300 12/15/12 0400 12/16/12 0420  12/16/12 1750 12/16/12 1800 12/16/12 1816 12/16/12 1932 12/17/12 0430 12/17/12 0655 12/17/12 1900 12/18/12 0450 12/18/12 0500 12/19/12 0445  HGB  --   < > 9.0* 8.2* 8.1*  < >  --   --   --   --  7.5*  --   --   --  8.5* 7.9*  WBC  --   < > 9.3 7.4 7.9  < >  --   --   --   --  9.3  --   --   --  21.8* 18.1*  PLT  --   < > 281 316 381  < >  --   --   --   --  339  --   --   --  411* 389  NA  --   --  140 141 139  --   --   --   --   --  139  --  135  --  134* 133*  K  --   --  3.3* 3.5 3.6  --   --   --   --   --  3.9  --  3.7  --  3.8 3.8  CL  --   --  110 110 110  --   --   --   --   --  111  --  106  --  105 104  CO2  --   --  20 21 19   --   --   --   --   --  16*  --  16*  --  15* 15*  GLUCOSE  --   --  77 74 72  --   --   --   --   --  107*  --  130*  --  106* 116*  BUN  --   --  31* 32* 33*  --   --   --   --   --  38*  --  41*  --  43* 51*  CREATININE  --   --  1.24* 1.35* 1.60*  --   --   --   --   --   2.01*  --  2.20*  --  2.33* 2.76*  CALCIUM  --   --  8.5 8.5 8.6  --   --   --   --   --  8.4  --  8.4  --  8.4 8.5  MG  --   --  1.9  --   --   --   --   --   --   --  1.9  --   --   --   --  2.2  PHOS  --   --   --   --   --   --   --   --   --   --  5.4*  --   --   --   --  6.6*  AST  --   --  25 18 19   --   --   --   --   --   --   --   --   --   --   --   ALT  --   --  10 9 9   --   --   --   --   --   --   --   --   --   --   --   ALKPHOS  --   --  89 79 83  --   --   --   --   --   --   --   --   --   --   --   BILITOT  --   --  0.8 0.6 0.5  --   --   --   --   --   --   --   --   --   --   --  PROT  --   --  4.5* 4.3* 4.4*  --   --   --   --   --   --   --   --   --   --   --   ALBUMIN  --   --  1.3* 1.1* 1.2*  --   --   --   --   --   --   --   --   --   --   --   LATICACIDVEN  --   --   --   --   --   < > 1.1  --   --   --   --  1.0  --  1.0  --   --   TROPONINI  --   --   --   --   --   --   --   --   --  <0.30  --   --   --   --   --   --   PROCALCITON  --   --   --   --   --   --   --  1.02  --   --   --   --   --   --  1.04  --   PHART 7.373  --   --   --   --   --   --   --  7.348*  --   --   --   --   --   --   --   PCO2ART 31.5*  --   --   --   --   --   --   --  29.2*  --   --   --   --   --   --   --   PO2ART 84.0  --   --   --   --   --   --   --  83.4  --   --   --   --   --   --   --   < > = values in this interval not displayed.  Recent Labs Lab 12/16/12 1917 12/18/12 2026 12/19/12 0002 12/19/12 0406 12/19/12 1204  GLUCAP 111* 115* 117* 101* 90   CXR: improvement in aeration  ASSESSMENT / PLAN:  PULMONARY A: Acute respiratory failure 2nd to HAP - Hx of COPD/asthma. Wheezing, anticipate severe pulmonary edema and impending respiratory failure with hypotension and volume resuscitation. P:   - F/u CXR in AM. - Oxygen to keep SpO2 > 92% - Bronchial hygiene - albuterol nebs - Hold singulair for now. - DNI status  CARDIOVASCULAR A: Septic shock 2nd to  pneumonia >> resolved. Hx of HTN. Atrial fibrillation B/l digital ischemia of feet and Rt hand >>  Related to pressors, no PAD , no embolic source Now in septic shock, doubt PE since was on heparin. P:  - Continue heparin gtt per pharmacy - CVP q shift - Stress dose steroids. - on neo, goal sbp 80, max dose 100, weaning as tolerated - Limited code - no cpr or cardioversion - will need amputation of toes at some point  RENAL A: Acute kidney injury >> deteriorating with hypotension. Likely ATN. Hypokalemia >> improved. Non gap metabolic acidosis >> resolved. Oliguric Mild hyponatremia P:   - Monitor CVP. - Avoid nephrotoxic agents - Trend BMET - Avoid contrast  GASTROINTESTINAL A: Constipation >> improved 7/17 with SMOG enema.  Dysphagia. Abdominal wall hernia >> CT findings d/w radiology >> likely benign findings. Hx of GERD. P:   - panda tube placed under fluoro guidance this AM - SUP, protonix. - start tube feeds  HEMATOLOGIC A: Anemia of chronic illness/iron deficiency -  PRBC transfusion on 7/08, 7/09, 7/14. 7/17. Thrombocytopenia - resolved Ischemic toes P:  - F/U CBC - Transfuse for Hb < 7 - on heparin gtt for ischemic toes, although arterial dopplers showed no PAD. Will d/w vascular today how long to continue anticoagulation for toe ischemia  INFECTIOUS A: HAP >> resolved. Septic shock likely, source unclear P:   - continue zosyn, d/c vanc - F/U on cultures.  ENDOCRINE A: Mild hyperglycemia >> no hx of DM. P:   - ISS. - CBGs as ordered.  NEUROLOGIC A: Acute encephalopathy - 2/2 to sepsis, hypoxia, and pain meds >> improved 7/18. Hx of CVA, depression, fibromyalgia, anxiety. Post-op pain control. Pain from lower extremity ischemia. P:   - Avoid benzo's. - prn fentanyl  ORTHOPEDIC A: Arthritis s/p Rt hip arthroplasty. P: - Post-op care per ortho - PT/OT   GLOBAL - Limited interventions. No HD or surgery, neo cap at 100. Unfortunately renal  failure is progressive & neo gtt is maxed out.  I have informed POA patricia that comfort appears to be more important than 'fixing' her medical problems.  Ongoing discussion of palliative options with family - meeting between palliative care team and family members scheduled for Monday PM.  Levert Feinstein, MD [facility] Medicine PGY-2  The patient is critically ill with multiple organ systems failure and requires high complexity decision making for assessment and support, frequent evaluation and titration of therapies, application of advanced monitoring technologies and extensive interpretation of multiple databases. Critical Care Time devoted to patient care services described in this note is 35 minutes.    ALVA,RAKESH V.

## 2012-12-19 NOTE — Progress Notes (Signed)
Speech Language Pathology Dysphagia Treatment Patient Details Name: Natalie Kent MRN: [id number] DOB: 1931-03-12 Today's Date: 12/19/2012 Time: 2130-8657 SLP Time Calculation (min): 12 min  Assessment / Plan / Recommendation Clinical Impression  Pt. seen for dysphagia treatment.  RN stated po intake continues to be very poor and diet/liquid order was discontinued due to repeatedly not eating from tray and fluctuating cognitive status.  Today oral care provided followed by trials of pudding and thin gingerale.  Evidence of worsening pharyngeal dysphagia including increased effortful and multiple swallows indicative of weak elevation and contraction.  Delayed throat clear with thin.  Aspiration risk is currently high with decrease in swallow function.  SLP recommends continuing NPO status.  Panda placed and RN reports it is not in correct placement, therefore has not been utilizing.  RN reports discussions for Palliative may be (or have been?) initiated.  SLP will follow next week and if no improvement, may sign off.      Diet Recommendation  Initiate / Change Diet: NPO    SLP Plan Continue with current plan of care   Pertinent Vitals/Pain none   Swallowing Goals  SLP Swallowing Goals Patient will consume recommended diet without observed clinical signs of aspiration with: Total assistance Swallow Study Goal #1 - Progress: Progressing toward goal Patient will utilize recommended strategies during swallow to increase swallowing safety with: Total assistance Swallow Study Goal #2 - Progress: Not met  General Temperature Spikes Noted: No Respiratory Status: Room air Behavior/Cognition: Lethargic;Requires cueing;Decreased sustained attention Oral Cavity - Dentition:  (dentures not in) Patient Positioning: Upright in bed  Oral Cavity - Oral Hygiene Does patient have any of the following "at risk" factors?: Other - dysphagia;Nutritional status - inadequate Brush patient's teeth BID with  toothbrush (using toothpaste with fluoride): Yes Patient is HIGH RISK - Oral Care Protocol followed (see row info): Yes   Dysphagia Treatment Treatment focused on: Skilled observation of diet tolerance Treatment Methods/Modalities: Skilled observation Patient observed directly with PO's: Yes Type of PO's observed: Thin liquids;Dysphagia 1 (puree) Feeding: Total assist Liquids provided via: Straw Pharyngeal Phase Signs & Symptoms: Suspected delayed swallow initiation;Multiple swallows;Delayed throat clear (effortful swallow) Type of cueing: Verbal Amount of cueing: Maximal   GO     Breck Coons Seward Coran M.Ed [employer] [phone]  12/19/2012

## 2012-12-19 NOTE — Progress Notes (Signed)
Noted plans for Palliative meeting with family this coming MOnday- will have CSW follow up for further support/interventions as appropriate.  Reece Levy, MSW [phone]

## 2012-12-19 NOTE — Progress Notes (Addendum)
Made Dr. Vassie Loll aware of Hypotension with Natalie Kent at the max of order. Reconfirmed care limitations as discussed with Pt family over past several days. He spoke with Primary family contact Elease Hashimoto to provide update and confirm care limitations in place.   Jacqulyn Cane RN, BSN, CCRN

## 2012-12-19 NOTE — Progress Notes (Signed)
ANTICOAGULATION CONSULT NOTE  Pharmacy Consult for heparin Indication: Afib  Patient Measurements:  Height: 5\' 6"  (167.6 cm)  Weight: 213 lb 13.5 oz (97 kg)  IBW/kg (Calculated) : 59.3  Adjusted Body Weight: 74 kg  Labs:  Recent Labs  12/16/12 1932 12/17/12 0430  12/17/12 1900  12/18/12 0500 12/18/12 1545 12/19/12 0445  HGB  --  7.5*  --   --   --  8.5*  --  7.9*  HCT  --  23.0*  --   --   --  25.6*  --  24.1*  PLT  --  339  --   --   --  411*  --  389  HEPARINUNFRC  --  0.95*  < >  --   < > 0.82* 0.54 0.94*  CREATININE  --  2.01*  --  2.20*  --  2.33*  --   --   TROPONINI <0.30  --   --   --   --   --   --   --   < > = values in this interval not displayed.  Assessment: 79 yo female with atrial fibrillation for heparin  Goal of Therapy:  Heparin level 0.3-0.5   Plan:  Hold heparin x 1 hr, then decrease heparin 1400 units/hr Check heparin level in 8 hours.  Geannie Risen, PharmD, BCPS  12/19/2012 5:16 AM

## 2012-12-20 ENCOUNTER — Inpatient Hospital Stay (HOSPITAL_COMMUNITY): Payer: Medicare Other

## 2012-12-20 LAB — CBC
Hemoglobin: 7.8 g/dL — ABNORMAL LOW (ref 12.0–15.0)
MCH: 29 pg (ref 26.0–34.0)
MCHC: 32.8 g/dL (ref 30.0–36.0)
MCV: 88.5 fL (ref 78.0–100.0)
RBC: 2.69 MIL/uL — ABNORMAL LOW (ref 3.87–5.11)

## 2012-12-20 LAB — BASIC METABOLIC PANEL
BUN: 57 mg/dL — ABNORMAL HIGH (ref 6–23)
CO2: 15 mEq/L — ABNORMAL LOW (ref 19–32)
Chloride: 103 mEq/L (ref 96–112)
Creatinine, Ser: 2.93 mg/dL — ABNORMAL HIGH (ref 0.50–1.10)
Glucose, Bld: 117 mg/dL — ABNORMAL HIGH (ref 70–99)
Potassium: 3.7 mEq/L (ref 3.5–5.1)

## 2012-12-20 LAB — GLUCOSE, CAPILLARY
Glucose-Capillary: 118 mg/dL — ABNORMAL HIGH (ref 70–99)
Glucose-Capillary: 120 mg/dL — ABNORMAL HIGH (ref 70–99)
Glucose-Capillary: 127 mg/dL — ABNORMAL HIGH (ref 70–99)
Glucose-Capillary: 178 mg/dL — ABNORMAL HIGH (ref 70–99)

## 2012-12-20 LAB — PHOSPHORUS: Phosphorus: 7.2 mg/dL — ABNORMAL HIGH (ref 2.3–4.6)

## 2012-12-20 MED ORDER — HALOPERIDOL LACTATE 5 MG/ML IJ SOLN: 1.0000 mg | Freq: Four times a day (QID) | INTRAMUSCULAR | Status: DC | PRN

## 2012-12-20 MED ORDER — PIPERACILLIN-TAZOBACTAM IN DEX 2-0.25 GM/50ML IV SOLN
2.2500 g | Freq: Three times a day (TID) | INTRAVENOUS | Status: DC
  Administered 2012-12-20 – 2012-12-22 (×6): 2.25 g via INTRAVENOUS
  Filled 2012-12-20 (×8): qty 50

## 2012-12-20 NOTE — Progress Notes (Signed)
ANTICOAGULATION and ANTIBIOTIC CONSULT NOTE - Follow Up Consult  Pharmacy Consult for Heparin and Zosyn Indication: atrial fibrillation, ischemia of extremities, Sepsis (unclear source)  Allergies  Allergen Reactions  . Solarcaine Aloe Extra (Lidocaine Hcl)   . Septra (Bactrim)   . Cephalexin Rash    Patient Measurements: Height: 5\' 6"  (167.6 cm) Weight: 227 lb 15.3 oz (103.4 kg) IBW/kg (Calculated) : 59.3 Heparin Dosing Weight: 74 kg  Vital Signs: Temp: 97.6 F (36.4 C) (07/26 0759) Temp src: Oral (07/26 0759) BP: 108/48 mmHg (07/26 1000) Pulse Rate: 95 (07/26 1000)  Labs:  Recent Labs  12/18/12 0500  12/19/12 0445 12/19/12 1520 12/19/12 2300 12/20/12 0400  HGB 8.5*  --  7.9*  --   --  7.8*  HCT 25.6*  --  24.1*  --   --  23.8*  PLT 411*  --  389  --   --  347  HEPARINUNFRC 0.82*  < > 0.94* 0.46 0.52 0.45  CREATININE 2.33*  --  2.76*  --   --  2.93*  < > = values in this interval not displayed.  Estimated Creatinine Clearance: 18.9 ml/min (by C-G formula based on Cr of 2.93).   Medications:  Scheduled:  . ipratropium  0.5 mg Nebulization Q4H   And  . albuterol  2.5 mg Nebulization Q4H  . antiseptic oral rinse  15 mL Mouth Rinse q12n4p  . chlorhexidine  15 mL Mouth Rinse BID  . [START ON 01/01/2013] cyanocobalamin  1,000 mcg Intramuscular Q30 days  . docusate  100 mg Per Tube BID  . hydrocortisone sodium succinate  50 mg Intravenous Q6H  . pantoprazole sodium  40 mg Per Tube QHS  . piperacillin-tazobactam (ZOSYN)  IV  3.375 g Intravenous Q8H    Assessment: 79 y/o F with afib and ischemic extremities on heparin drip. HL is 0.45<0.52, CBC low and stable, no overt bleeding noted. Scr 2.93<2.76<2.33 with CrCl <20.  Also on Zosyn for sepsis with unknown source. WBC 18.3<18.1, afebrile. SCr still increasing today, so will decrease Zosyn dose  Goal of Therapy:  Heparin level 0.3-0.5 Monitor platelets by anticoagulation protocol: Yes   Plan:  -Continue  heparin infusion at 1300 units/hr -Daily CBC/HL -Monitor for bleeding  -Change Zosyn to 2.25g IV q8h -Trend WBC, temp, renal function -F/U length of treatment  Thank you for allowing me to take part in this patient's care,  Abran Duke, PharmD Clinical Pharmacist Phone: [phone] Pager: [phone] 12/20/2012 11:01 AM

## 2012-12-20 NOTE — Progress Notes (Signed)
Pt was found actively pulling the leads off and the central line already partially pulled out. Pt is unable to properly answer questions and appears to be confused. The central line will be secured until the physician is notified.

## 2012-12-20 NOTE — Progress Notes (Signed)
Subjective: 18 Days Post-Op Procedure(s) (LRB): TOTAL HIP ARTHROPLASTY ANTERIOR APPROACH (Right) Patient reports pain as 3 on 0-10 scale.,Patient is in the ICU, Appears to be relatively comfortable but is requiring pressor support with Neo-Synephrine.  She is on a limited code basis.  Objective: Vital signs in last 24 hours: Temp:  [97 F (36.1 C)-97.6 F (36.4 C)] 97.6 F (36.4 C) (07/26 0759) Pulse Rate:  [41-87] 85 (07/26 0700) Resp:  [12-22] 20 (07/26 0700) BP: (57-134)/(24-91) 124/55 mmHg (07/26 0700) SpO2:  [85 %-100 %] 99 % (07/26 0700) Weight:  [103.4 kg (227 lb 15.3 oz)] 103.4 kg (227 lb 15.3 oz) (07/26 0500)  Intake/Output from previous day: 07/25 0701 - 07/26 0700 In: 2013.8 [I.V.:1515.5; NG/GT:348.3; IV Piggyback:150] Out: 640 [Urine:640] Intake/Output this shift: Total I/O In: 74.4 [I.V.:49.4; NG/GT:25] Out: 75 [Urine:75]   Recent Labs  12/18/12 0500 12/19/12 0445 12/20/12 0400  HGB 8.5* 7.9* 7.8*    Recent Labs  12/19/12 0445 12/20/12 0400  WBC 18.1* 18.3*  RBC 2.72* 2.69*  HCT 24.1* 23.8*  PLT 389 347    Recent Labs  12/19/12 0445 12/20/12 0400  NA 133* 134*  K 3.8 3.7  CL 104 103  CO2 15* 15*  BUN 51* 57*  CREATININE 2.76* 2.93*  GLUCOSE 116* 117*  CALCIUM 8.5 8.3*   No results found for this basename: LABPT, INR,  in the last 72 hours  Physical exam: Right anterior hip wound has minimal serous drainage and the metabolic stressing at 24 hours has 5% saturation.  A patient of the wound reveals minimal tenderness.  No erythema to skin.  All of her toes continue to have bearing degrees of necrosis around the DIP joint, dry gangrene with no A sending erythema.  Assessment/Plan: 18 Days Post-Op Procedure(s) (LRB): TOTAL HIP ARTHROPLASTY ANTERIOR APPROACH (Right),Nondisplaced trochanteric fracture by CT scan. Patient is okay for range of motion of her right total hip and 50% weightbearing.  We will continue to monitor her toes, but in light  of her medical issues is not a candidate for any sort of elective surgery.We'll continue to monitor her hip wound although there is no obvious infection.  All staples are out.  Emojean Gertz J 12/20/2012, 8:49 AM

## 2012-12-20 NOTE — Progress Notes (Signed)
ANTICOAGULATION CONSULT NOTE - Follow Up Consult  Pharmacy Consult for Heparin Indication: atrial fibrillation, ischemia of extremities   Allergies  Allergen Reactions  . Solarcaine Aloe Extra (Lidocaine Hcl)   . Septra (Bactrim)   . Cephalexin Rash   Patient Measurements: Height: 5\' 6"  (167.6 cm) Weight: 227 lb 1.2 oz (103 kg) IBW/kg (Calculated) : 59.3 Heparin Dosing Weight: 74 kg  Vital Signs: Temp: 97.4 F (36.3 C) (07/25 2348) Temp src: Oral (07/25 2348) BP: 115/61 mmHg (07/25 2300) Pulse Rate: 85 (07/25 2300)  Labs:  Recent Labs  12/17/12 0430  12/17/12 1900  12/18/12 0500  12/19/12 0445 12/19/12 1520 12/19/12 2300  HGB 7.5*  --   --   --  8.5*  --  7.9*  --   --   HCT 23.0*  --   --   --  25.6*  --  24.1*  --   --   PLT 339  --   --   --  411*  --  389  --   --   HEPARINUNFRC 0.95*  < >  --   < > 0.82*  < > 0.94* 0.46 0.52  CREATININE 2.01*  --  2.20*  --  2.33*  --  2.76*  --   --   < > = values in this interval not displayed.  Estimated Creatinine Clearance: 20 ml/min (by C-G formula based on Cr of 2.76).  Medications:  Scheduled:  . ipratropium  0.5 mg Nebulization Q4H   And  . albuterol  2.5 mg Nebulization Q4H  . antiseptic oral rinse  15 mL Mouth Rinse q12n4p  . chlorhexidine  15 mL Mouth Rinse BID  . [START ON 01/01/2013] cyanocobalamin  1,000 mcg Intramuscular Q30 days  . docusate  100 mg Per Tube BID  . hydrocortisone sodium succinate  50 mg Intravenous Q6H  . pantoprazole sodium  40 mg Per Tube QHS  . piperacillin-tazobactam (ZOSYN)  IV  3.375 g Intravenous Q8H   Assessment: 79 y/o F with afib and ischemic extremities on heparin drip. HL is 0.52 which is just slightly above goal range.  CBC low and stable, no overt bleeding noted. To avoid overshooting target with possible drug accumulation, will decrease rate to 1300 units/hr.   Goal of Therapy:  Heparin level 0.3-0.5 Monitor platelets by anticoagulation protocol: Yes   Plan:  - Decrease  IV heparin infusion to 1300 units/hr - Check AM heparin level/CBC -Daily CBC/HL -Monitor for bleeding  Nadara Mustard, PharmD., MS Clinical Pharmacist Pager:  [phone] Thank you for allowing pharmacy to be part of this patients care team. 12/20/2012 12:27 AM

## 2012-12-20 NOTE — Progress Notes (Signed)
PULMONARY  / CRITICAL CARE MEDICINE  Name: Natalie Kent MRN: [id number] DOB: 12-06-1930    ADMISSION DATE:  Dec 11, 2012 CONSULTATION DATE:  12/04/2012  REFERRING MD : Marcene Corning  CHIEF COMPLAINT:  Rt hip pain  BRIEF PATIENT DESCRIPTION:  79 yo female with arthritis and Rt hip pain.  Had Rt total hip arthroplasty.  Developed altered mental status, abdominal discomfort and hypotension POD#1.  PCCM consulted 7/10 , required mechanical ventilation until 7/15, course complicated by gangrene of toes due to pressors. PCCM reconsulted 7/22 for hypotension, AKI, presumed ileus vs abdominal sepsis  SIGNIFICANT EVENTS: 7/08 Rt total hip arthroplasty 7/09 Change mental status, acute renal failure 7/10 VDRF 7/15 Extubated 7/16 Still no bowel movement, wheezing on exam 7/17 VVS consult for lower extremity digital ischemia 7/22 transferred to ICU for hypotension, AKI, ileus vs abdominal sepsis  STUDIES:  7/09 CT head >> no acute findings.  Prior Lt MCA infarct and  left MCA M1 segment stent. 7/09 Abd u/s >> no acute findings 7/09 CT abd/pelvis >> stool filled colon, anterior abdominal wall hernia 7/10 Echo >> mild LVH, EF 65 to 70%, grade 1 diastolic dysfx, mild LA dilation 7/15 ABD xray >> relatively large stool burden 7/17 ABI's 7/23 stool filled trasnverse colon, hip ok 7/24 Poor urine output despite lasix 80mg  IV   LINES / TUBES: 7/10 Lt IJ CVL >>  7/10 ETT >>7/15  7/10 Rt radial aline >> out  CULTURES: 7/09 Urine >> negative 7/09 Blood >> no growth 7/09 Sputum >> Yeast 7/22 blood>>>ngtd 7/22 urine>>> no growth final 7/22 c diff neg  ANTIBIOTICS: Vancomycin 7/08 >> 7/18 Imipenem 7/09 >> 7/18 Vancomycin 7/22>>>7/25 Zosyn 7/22>>>  SUBJECTIVE:  Afebrile Off neo gtt Confusion +  VITAL SIGNS: Temp:  [97.3 F (36.3 C)-97.6 F (36.4 C)] 97.6 F (36.4 C) (07/26 0759) Pulse Rate:  [41-87] 85 (07/26 0700) Resp:  [12-22] 20 (07/26 0700) BP: (57-134)/(24-91) 124/55 mmHg  (07/26 0700) SpO2:  [85 %-100 %] 99 % (07/26 0700) Weight:  [103.4 kg (227 lb 15.3 oz)] 103.4 kg (227 lb 15.3 oz) (07/26 0500) 5 liters [place]  HEMODYNAMICS:    INTAKE / OUTPUT: Intake/Output     07/25 0701 - 07/26 0700 07/26 0701 - 07/27 0700   I.V. (mL/kg) 1515.5 (14.7) 49.4 (0.5)   NG/GT 348.3 25   IV Piggyback 150    Total Intake(mL/kg) 2013.8 (19.5) 74.4 (0.7)   Urine (mL/kg/hr) 640 (0.3) 75 (0.3)   Total Output 640 75   Net +1373.8 -0.6        Stool Occurrence 1 x     PHYSICAL EXAMINATION: General: Elderly ill appearing female Neuro: int confusion, grossly non focal HEENT: NCAT Cardiovascular: RRR Lungs: bilateral breath sounds present,decreased,  less wheezing than yesterday Abdomen: soft, tender, distended, copious abdominal wall edema Musculoskeletal: bilateral feet have black toes Skin: black toes bilaterally  LABS:  Recent Labs Lab 12/13/12 1051  12/14/12 0300 12/15/12 0400 12/16/12 0420  12/16/12 1750 12/16/12 1800 12/16/12 1816 12/16/12 1932 12/17/12 0430 12/17/12 0655  12/18/12 0450 12/18/12 0500 12/19/12 0445 12/20/12 0400  HGB  --   < > 9.0* 8.2* 8.1*  < >  --   --   --   --  7.5*  --   --   --  8.5* 7.9* 7.8*  WBC  --   < > 9.3 7.4 7.9  < >  --   --   --   --  9.3  --   --   --  21.8* 18.1* 18.3*  PLT  --   < > 281 316 381  < >  --   --   --   --  339  --   --   --  411* 389 347  NA  --   < > 140 141 139  --   --   --   --   --  139  --   < >  --  134* 133* 134*  K  --   < > 3.3* 3.5 3.6  --   --   --   --   --  3.9  --   < >  --  3.8 3.8 3.7  CL  --   < > 110 110 110  --   --   --   --   --  111  --   < >  --  105 104 103  CO2  --   < > 20 21 19   --   --   --   --   --  16*  --   < >  --  15* 15* 15*  GLUCOSE  --   < > 77 74 72  --   --   --   --   --  107*  --   < >  --  106* 116* 117*  BUN  --   < > 31* 32* 33*  --   --   --   --   --  38*  --   < >  --  43* 51* 57*  CREATININE  --   < > 1.24* 1.35* 1.60*  --   --   --   --   --  2.01*  --   <  >  --  2.33* 2.76* 2.93*  CALCIUM  --   < > 8.5 8.5 8.6  --   --   --   --   --  8.4  --   < >  --  8.4 8.5 8.3*  MG  --   < > 1.9  --   --   --   --   --   --   --  1.9  --   --   --   --  2.2 2.3  PHOS  --   --   --   --   --   --   --   --   --   --  5.4*  --   --   --   --  6.6* 7.2*  AST  --   --  25 18 19   --   --   --   --   --   --   --   --   --   --   --   --   ALT  --   --  10 9 9   --   --   --   --   --   --   --   --   --   --   --   --   ALKPHOS  --   --  89 79 83  --   --   --   --   --   --   --   --   --   --   --   --   BILITOT  --   --  0.8 0.6 0.5  --   --   --   --   --   --   --   --   --   --   --   --  PROT  --   --  4.5* 4.3* 4.4*  --   --   --   --   --   --   --   --   --   --   --   --   ALBUMIN  --   --  1.3* 1.1* 1.2*  --   --   --   --   --   --   --   --   --   --   --   --   LATICACIDVEN  --   --   --   --   --   < > 1.1  --   --   --   --  1.0  --  1.0  --   --   --   TROPONINI  --   --   --   --   --   --   --   --   --  <0.30  --   --   --   --   --   --   --   PROCALCITON  --   --   --   --   --   --   --  1.02  --   --   --   --   --   --  1.04  --  0.94  PHART 7.373  --   --   --   --   --   --   --  7.348*  --   --   --   --   --   --   --   --   PCO2ART 31.5*  --   --   --   --   --   --   --  29.2*  --   --   --   --   --   --   --   --   PO2ART 84.0  --   --   --   --   --   --   --  83.4  --   --   --   --   --   --   --   --   < > = values in this interval not displayed.  Recent Labs Lab 12/19/12 1635 12/19/12 1959 12/19/12 2347 12/20/12 0347 12/20/12 0736  GLUCAP 103* 100* 118* 120* 124*   CXR: improvement in aeration  ASSESSMENT / PLAN:  PULMONARY A: Acute respiratory failure 2nd to HAP - Hx of COPD/asthma. Wheezing - resolved P:   - F/u CXR in AM. - Oxygen to keep SpO2 > 92% - Bronchial hygiene - albuterol Reita May nebs - Hold singulair for now. - DNI status  CARDIOVASCULAR A: Septic shock 2nd to pneumonia >> resolved. Hx  of HTN. Atrial fibrillation B/l digital ischemia of feet and Rt hand >>  Related to pressors, no PAD , no embolic source doubt PE since was on heparin. P:  - Continue heparin gtt per pharmacy - CVP q shift - Stress dose steroids. - off neo, goal sbp 80, max dose 100 - Limited code - no cpr or cardioversion - will need amputation of toes at some point  RENAL A: Acute kidney injury >> deteriorating with hypotension. Likely ATN. Hypokalemia >> improved. Non gap metabolic acidosis >> resolved. Oliguric Mild hyponatremia -improving P:   - Monitor CVP. - Avoid nephrotoxic agents - Trend BMET  GASTROINTESTINAL A: Constipation >> improved 7/17  with SMOG enema. Dysphagia. Abdominal wall hernia >> CT findings d/w radiology >> likely benign findings. Hx of GERD. P:   - panda tube placed under fluoro guidance this AM - SUP, protonix. -ct tube feeds  HEMATOLOGIC A: Anemia of chronic illness/iron deficiency -  PRBC transfusion on 7/08, 7/09, 7/14. 7/17. Thrombocytopenia - resolved Ischemic toes P:  - F/U CBC - Transfuse for Hb < 7 -heparin per levels  INFECTIOUS A: HAP >> resolved. Septic shock likely, source unclear, LLL pna was treated  P:   - continue zosyn x 5ds total, d/c vanc - F/U on cultures.  ENDOCRINE A: Mild hyperglycemia >> no hx of DM. P:   - ISS. - CBGs as ordered.  NEUROLOGIC A: Acute encephalopathy - 2/2 to sepsis, hypoxia, and pain meds >> improved 7/18. Hx of CVA, depression, fibromyalgia, anxiety. Post-op pain control. Pain from lower extremity ischemia. P:   - Avoid benzo's. - prn fentanyl -haldol prn agitation only  ORTHOPEDIC A: Arthritis s/p Rt hip arthroplasty. P: - Post-op care per ortho, 50% wt bearing Ok - Push PT/OT   GLOBAL - Limited interventions. No HD or surgery, neo cap at 100. Unfortunately renal failure is progressive but appears to be plateu-ing. I have kept  POA patricia informed - however other family members appear to  be giving mixed messages. Clearly comfort important in parallel to full medical care Family declined palliative care consult   The patient is critically ill with multiple organ systems failure and requires high complexity decision making for assessment and support, frequent evaluation and titration of therapies, application of advanced monitoring technologies and extensive interpretation of multiple databases. Critical Care Time devoted to patient care services described in this note is 35 minutes.    Yousra Ivens V.

## 2012-12-20 NOTE — Progress Notes (Signed)
Patient ZO:XWRUE N [place]      DOB: 1930-12-05      AVW:[id number]  Received a call last evening from family canceling Goals of Care meeting on Monday.  Will sign off.  Offered to be available to their call.  Annsley Akkerman L. Ladona Ridgel, MD MBA The Palliative Medicine Team at [facility] Phone: [phone] Pager: [phone]

## 2012-12-20 NOTE — Progress Notes (Signed)
Family meeting took place at 1150pm, requested by patient's son and Andrey Campanile (daughter-in-law). The son was wanting to discuss transferring the patient to [facility]. Son stated that the family felt like they were getting mixed messages about patient's prognosis and plan. MD Siqueris discussed patients current state and treatments, answering family questions for . Son would like to talk with rounding team today.  Doree Albee

## 2012-12-21 LAB — CBC
Hemoglobin: 8.2 g/dL — ABNORMAL LOW (ref 12.0–15.0)
MCH: 29.4 pg (ref 26.0–34.0)
MCV: 90.7 fL (ref 78.0–100.0)
MCV: 91.2 fL (ref 78.0–100.0)
Platelets: 299 10*3/uL (ref 150–400)
Platelets: 311 10*3/uL (ref 150–400)
RBC: 2.79 MIL/uL — ABNORMAL LOW (ref 3.87–5.11)
RBC: 2.84 MIL/uL — ABNORMAL LOW (ref 3.87–5.11)
WBC: 22.1 10*3/uL — ABNORMAL HIGH (ref 4.0–10.5)
WBC: 29.3 10*3/uL — ABNORMAL HIGH (ref 4.0–10.5)

## 2012-12-21 LAB — MAGNESIUM: Magnesium: 2.4 mg/dL (ref 1.5–2.5)

## 2012-12-21 LAB — GLUCOSE, CAPILLARY
Glucose-Capillary: 161 mg/dL — ABNORMAL HIGH (ref 70–99)
Glucose-Capillary: 173 mg/dL — ABNORMAL HIGH (ref 70–99)

## 2012-12-21 LAB — BASIC METABOLIC PANEL
Calcium: 8.3 mg/dL — ABNORMAL LOW (ref 8.4–10.5)
GFR calc non Af Amer: 13 mL/min — ABNORMAL LOW (ref >90)
Sodium: 134 mEq/L — ABNORMAL LOW (ref 135–145)

## 2012-12-21 MED ORDER — SODIUM BICARBONATE 8.4 % IV SOLN: INTRAVENOUS | Status: DC

## 2012-12-21 MED ORDER — SODIUM BICARBONATE 8.4 % IV SOLN
50.0000 meq | Freq: Four times a day (QID) | INTRAVENOUS | Status: AC
  Administered 2012-12-21 (×3): 50 meq via INTRAVENOUS
  Filled 2012-12-21 (×3): qty 50

## 2012-12-21 NOTE — Consult Note (Signed)
Natalie Kent 12/21/2012 Natalie Kent D Requesting Physician:  Dr Vassie Loll  Reason for Consult:  Acute renal failure HPI: The patient is a 79 y.o. year-old with hx of HTN, anemia, CVA in 2006, asthma, DJD, FM, "mental disorder", depression, DVT, PVD and afib admitted on 7/8 for R THR surgery. Post op was treated in ICU for LLL HCAP with mech ventilation and sepsis. Extubated on 7/15 and course was complicated by gangrene of toes due to pressors.  Recovered then on 7/22 became ill with hypotension, AKI and ileus vs abd sepsis.  Over last 5-6 days creatinine has been climbing associated with low UOP from 200-500 cc/day.  Creat is 3.0 today and renal service asked to see for AKI.   Patient is obtunded and not responding.    ROS  not available  Past Medical History  Past Medical History  Diagnosis Date  . DVT (deep venous thrombosis)   . Asthma   . Arthritis   . Hypertension   . Right rib fracture   . Fibromyalgia   . Mental disorder   . Anxiety   . Depression   . Pneumonia     hx  . Peripheral vascular disease 70's    groin blood clot  . GERD (gastroesophageal reflux disease)   . H/O hiatal hernia   . Anemia     hx  . Dysrhythmia     "irregular" heart rate --- history of sinus arrhythmia  . Stroke 03/2005   Past Surgical History  Past Surgical History  Procedure Laterality Date  . Abdominal hysterectomy    . Cholecystectomy    . Knee surgery Bilateral   . Endovascular stent insertion      left MCA stent 08/2005, 09/2006, intracranial angioplasty 01/2007  . Eye surgery Bilateral     cat  . Varicose vein surgery    . Total hip arthroplasty Right 12/08/2012    Procedure: TOTAL HIP ARTHROPLASTY ANTERIOR APPROACH;  Surgeon: Velna Ochs, MD;  Location: [facility] OR;  Service: Orthopedics;  Laterality: Right;  DEPUY   Family History History reviewed. No pertinent family history. Social History  reports that she has never smoked. She has never used smokeless tobacco. She reports that  she does not drink alcohol or use illicit drugs. Allergies  Allergies  Allergen Reactions  . Solarcaine Aloe Extra (Lidocaine Hcl)   . Septra (Bactrim)   . Cephalexin Rash   Home medications Prior to Admission medications   Medication Sig Start Date End Date Taking? Authorizing Provider  aspirin EC 81 MG tablet Take 81 mg by mouth daily.   Yes Historical Provider, MD  cholecalciferol (VITAMIN D) 1000 UNITS tablet Take 1,000 Units by mouth daily.   Yes Historical Provider, MD  clopidogrel (PLAVIX) 75 MG tablet Take 75 mg by mouth every other day.    Yes Historical Provider, MD  cyanocobalamin (,VITAMIN B-12,) 1000 MCG/ML injection Inject 1,000 mcg into the muscle every 30 (thirty) days.   Yes Historical Provider, MD  diltiazem (CARDIZEM CD) 240 MG 24 hr capsule Take 240 mg by mouth daily.     Yes Historical Provider, MD  Fluticasone-Salmeterol (ADVAIR) 500-50 MCG/DOSE AEPB Inhale 1 puff into the lungs every 12 (twelve) hours.   Yes Historical Provider, MD  HYDROcodone-acetaminophen (NORCO/VICODIN) 5-325 MG per tablet Take 1 tablet by mouth every 4 (four) hours as needed for pain.   Yes Historical Provider, MD  losartan-hydrochlorothiazide (HYZAAR) 100-25 MG per tablet Take 1 tablet by mouth daily.  Yes Historical Provider, MD  meloxicam (MOBIC) 15 MG tablet Take 15 mg by mouth daily.   Yes Historical Provider, MD  montelukast (SINGULAIR) 10 MG tablet Take 10 mg by mouth at bedtime.   Yes Historical Provider, MD  pantoprazole (PROTONIX) 40 MG tablet Take 40 mg by mouth daily.   Yes Historical Provider, MD  polyethylene glycol (MIRALAX / GLYCOLAX) packet Take 17 g by mouth daily as needed (for constipation).    Yes Historical Provider, MD  SPIRIVA HANDIHALER 18 MCG inhalation capsule Place 18 mcg into inhaler and inhale daily.  11/10/12  Yes Historical Provider, MD  trolamine salicylate (ASPERCREME) 10 % cream Apply 1 application topically as needed (for muscle pain).   Yes Historical  Provider, MD  venlafaxine XR (EFFEXOR-XR) 150 MG 24 hr capsule Take 150 mg by mouth every morning.    Yes Historical Provider, MD  albuterol (PROVENTIL HFA;VENTOLIN HFA) 108 (90 BASE) MCG/ACT inhaler Inhale 2 puffs into the lungs every 6 (six) hours as needed for shortness of breath.     Historical Provider, MD   Liver Function Tests  Recent Labs Lab 12/15/12 0400 12/16/12 0420  AST 18 19  ALT 9 9  ALKPHOS 79 83  BILITOT 0.6 0.5  PROT 4.3* 4.4*  ALBUMIN 1.1* 1.2*   No results found for this basename: LIPASE, AMYLASE,  in the last 168 hours CBC  Recent Labs Lab 12/19/12 0445 12/20/12 0400 12/21/12 0620  WBC 18.1* 18.3* 22.1*  HGB 7.9* 7.8* 8.2*  HCT 24.1* 23.8* 25.3*  MCV 88.6 88.5 90.7  PLT 389 347 299   Basic Metabolic Panel  Recent Labs Lab 12/16/12 0420 12/17/12 0430 12/17/12 1900 12/18/12 0500 12/19/12 0445 12/20/12 0400 12/21/12 0620  NA 139 139 135 134* 133* 134* 134*  K 3.6 3.9 3.7 3.8 3.8 3.7 3.7  CL 110 111 106 105 104 103 102  CO2 19 16* 16* 15* 15* 15* 14*  GLUCOSE 72 107* 130* 106* 116* 117* 171*  BUN 33* 38* 41* 43* 51* 57* 64*  CREATININE 1.60* 2.01* 2.20* 2.33* 2.76* 2.93* 3.20*  CALCIUM 8.6 8.4 8.4 8.4 8.5 8.3* 8.3*  PHOS  --  5.4*  --   --  6.6* 7.2* 8.2*    Physical Exam  Blood pressure 73/57, pulse 102, temperature 95.8 F (35.4 C), temperature source Axillary, resp. rate 19, height 5\' 6"  (1.676 m), weight 105 kg (231 lb 7.7 oz), SpO2 100.00%. Gen: elderly WF, unresponsive, poor resp effort, gurgling Skin: no rash, cyanosis HEENT:  EOMI, sclera anicteric Neck: no JVD or bruit Chest: bilat crackles at bases CV: irregular, 2/6 SEM at base Abdomen: soft, markedly obese, diffuse abd wall edema, +BS Ext: +2 bilat LE edema, gangrenous changes bilat toes on both feet Neuro: unresponsive  Impression/Plan: 1 AKI due to shock/ATN- not a dialysis candidate due to severe comorbidities and very poor outlook either way. Recommend conservative  management.  Have discussed with primary CCM MD 2 AMS due to sepsis 3 S/P THR  4 HCAP, s/p abx 5 Sepsis / Reina Fuse 6 Afib 7 Gangrene of toes bilat  Vinson Moselle  MD Pager [phone]    Cell  [phone] 12/21/2012, 3:33 PM

## 2012-12-21 NOTE — Progress Notes (Signed)
PATIENT ID: Natalie Kent  MRN: [id number]  DOB/AGE:  77-Jan-1932 / 77 y.o.  19 Days Post-Op Procedure(s) (LRB): TOTAL HIP ARTHROPLASTY ANTERIOR APPROACH (Right)    PROGRESS NOTE Subjective:  19 days postop from a right total hip arthroplasty anterior approach.  Patient in the ICU.  Patient is nonverbal but does motion toward her hip and shakes her head no when asked if her hip hurts.  Patient is on a limited code basis  Objective: Vital signs in last 24 hours: Temp:  [94.4 F (34.7 C)-97.4 F (36.3 C)] 94.4 F (34.7 C) (07/27 0423) Pulse Rate:  [40-124] 49 (07/27 0900) Resp:  [13-23] 23 (07/27 0900) BP: (82-124)/(42-85) 83/63 mmHg (07/27 0900) SpO2:  [92 %-100 %] 96 % (07/27 0900) Weight:  [105 kg (231 lb 7.7 oz)] 105 kg (231 lb 7.7 oz) (07/27 0500)    Intake/Output from previous day: I/O last 3 completed shifts: In: 3156.5 [I.V.:1562.5; Other:40; NG/GT:1304; IV Piggyback:250] Out: 1990 [Urine:790; Stool:1200]   Intake/Output this shift: Total I/O In: 68 [I.V.:23; NG/GT:45] Out: -    LABORATORY DATA:  Recent Labs  12/20/12 0400  12/20/12 1929 12/21/12 12/21/12 0413 12/21/12 0620  WBC 18.3*  --   --   --   --  22.1*  HGB 7.8*  --   --   --   --  8.2*  HCT 23.8*  --   --   --   --  25.3*  PLT 347  --   --   --   --  299  NA 134*  --   --   --   --  134*  K 3.7  --   --   --   --  3.7  CL 103  --   --   --   --  102  CO2 15*  --   --   --   --  14*  BUN 57*  --   --   --   --  64*  CREATININE 2.93*  --   --   --   --  3.20*  GLUCOSE 117*  --   --   --   --  171*  GLUCAP  --   < > 178* 158* 153*  --   CALCIUM 8.3*  --   --   --   --  8.3*  < > = values in this interval not displayed.  Examination: shows right anterior hip wound has no current drainage.  The patient's weeping wound over the lateral hip has stopped weeping and the [employer] bandage has been discontinued.  Patient has no signs of cellulitis and no drainage over the hip.  Patient's toes continued to have  varying degrees of necrosis to fully around the DIP joints.  No erythema or warmth.  No signs of ascending cellulitis. Assessment:   19 Days Post-Op Procedure(s) (LRB): TOTAL HIP ARTHROPLASTY ANTERIOR APPROACH (Right)with a nondisplaced trochanteric fracture by CT scan    Plan: Continue to monitor the patient's toes, but in light of her current medical issues and the patient being in ICU she is not a candidate for elective surgery. Continue to monitor patient's right hip incision with no current signs of infection. She may get up with therapy and may be partial weightbearing on the right side at 50% is able to.     Dannielle Burn K 12/21/2012, 9:25 AM

## 2012-12-21 NOTE — Progress Notes (Signed)
ANTICOAGULATION CONSULT NOTE - Follow Up Consult  Pharmacy Consult for Heparin Indication: atrial fibrillation, ischemia of extremities  Allergies  Allergen Reactions  . Solarcaine Aloe Extra (Lidocaine Hcl)   . Septra (Bactrim)   . Cephalexin Rash    Patient Measurements: Height: 5\' 6"  (167.6 cm) Weight: 231 lb 7.7 oz (105 kg) IBW/kg (Calculated) : 59.3 Heparin Dosing Weight: 74 kg  Vital Signs: Temp: 95.8 F (35.4 C) (07/27 0800) Temp src: Axillary (07/27 0800) BP: 97/43 mmHg (07/27 1030) Pulse Rate: 95 (07/27 1030)  Labs:  Recent Labs  12/19/12 0445  12/19/12 2300 12/20/12 0400 12/21/12 0620  HGB 7.9*  --   --  7.8* 8.2*  HCT 24.1*  --   --  23.8* 25.3*  PLT 389  --   --  347 299  HEPARINUNFRC 0.94*  < > 0.52 0.45 0.46  CREATININE 2.76*  --   --  2.93* 3.20*  < > = values in this interval not displayed.  Estimated Creatinine Clearance: 17.5 ml/min (by C-G formula based on Cr of 3.2).   Medications:  Scheduled:  . ipratropium  0.5 mg Nebulization Q4H   And  . albuterol  2.5 mg Nebulization Q4H  . antiseptic oral rinse  15 mL Mouth Rinse q12n4p  . chlorhexidine  15 mL Mouth Rinse BID  . [START ON 01/01/2013] cyanocobalamin  1,000 mcg Intramuscular Q30 days  . docusate  100 mg Per Tube BID  . hydrocortisone sodium succinate  50 mg Intravenous Q6H  . pantoprazole sodium  40 mg Per Tube QHS  . piperacillin-tazobactam (ZOSYN)  IV  2.25 g Intravenous Q8H  . sodium bicarbonate  50 mEq Intravenous Q6H    Assessment: 79 y/o F with afib and ischemic extremities on heparin drip. HL is 0.46<<0.45<<0.52, CBC low and stable, no overt bleeding noted. Scr 3.20<2.93<2.76<2.33 with CrCl <20.  Goal of Therapy:  Heparin level 0.3-0.5 Monitor platelets by anticoagulation protocol: Yes   Plan:  -Continue heparin infusion at 1300 units/hr -Daily CBC/HL -Monitor for bleeding  Thank you for allowing me to take part in this patient's care,  Abran Duke,  PharmD Clinical Pharmacist Phone: [phone] Pager: [phone] 12/21/2012 10:50 AM

## 2012-12-21 NOTE — Progress Notes (Signed)
PULMONARY  / CRITICAL CARE MEDICINE  Name: Natalie Kent MRN: [id number] DOB: 09/07/1930    ADMISSION DATE:  December 10, 2012 CONSULTATION DATE:  12/04/2012  REFERRING MD : Marcene Corning  CHIEF COMPLAINT:  Rt hip pain  BRIEF PATIENT DESCRIPTION:  79 yo female with arthritis and Rt hip pain.  Had Rt total hip arthroplasty.  Developed altered mental status, abdominal discomfort and hypotension POD#1.  PCCM consulted 7/10 , treated as sepsis fro LLL HCAP,required mechanical ventilation until 7/15, course complicated by gangrene of toes due to pressors. PCCM reconsulted 7/22 for hypotension, AKI, presumed ileus vs abdominal sepsis  SIGNIFICANT EVENTS: 7/08 Rt total hip arthroplasty 7/09 Change mental status, acute renal failure 7/10 VDRF 7/15 Extubated 7/16 Still no bowel movement, wheezing on exam 7/17 VVS consult for lower extremity digital ischemia 7/22 transferred to ICU for hypotension, AKI, ileus vs abdominal sepsis 7/25 off pressors  STUDIES:  7/09 CT head >> no acute findings.  Prior Lt MCA infarct and  left MCA M1 segment stent. 7/09 Abd u/s >> no acute findings 7/09 CT abd/pelvis >> stool filled colon, anterior abdominal wall hernia 7/10 Echo >> mild LVH, EF 65 to 70%, grade 1 diastolic dysfx, mild LA dilation 7/15 ABD xray >> relatively large stool burden 7/17 ABI's 7/23 stool filled trasnverse colon, hip ok 7/24 Poor urine output despite lasix 80mg  IV   LINES / TUBES: 7/10 Lt IJ CVL >>  7/10 ETT >>7/15  7/10 Rt radial aline >> out  CULTURES: 7/09 Urine >> negative 7/09 Blood >> no growth 7/09 Sputum >> Yeast 7/22 blood>>>ngtd 7/22 urine>>> no growth final 7/22 c diff neg  ANTIBIOTICS: Vancomycin 7/08 >> 7/18 Imipenem 7/09 >> 7/18 Vancomycin 7/22>>>7/25 Zosyn 7/22>>>  SUBJECTIVE:  Afebrile Remains Off neo gtt but not thriving UO poor Confusion +  VITAL SIGNS: Temp:  [94.4 F (34.7 C)-97.4 F (36.3 C)] 95.8 F (35.4 C) (07/27 0800) Pulse Rate:   [40-147] 95 (07/27 1030) Resp:  [13-26] 18 (07/27 1030) BP: (80-124)/(40-91) 97/43 mmHg (07/27 1030) SpO2:  [92 %-100 %] 100 % (07/27 1030) Weight:  [105 kg (231 lb 7.7 oz)] 105 kg (231 lb 7.7 oz) (07/27 0500)   HEMODYNAMICS:    INTAKE / OUTPUT: Intake/Output     07/26 0701 - 07/27 0700 07/27 0701 - 07/28 0700   I.V. (mL/kg) 817.6 (7.8) 46 (0.4)   Other 40    NG/GT 1014 130   IV Piggyback 200    Total Intake(mL/kg) 2071.6 (19.7) 176 (1.7)   Urine (mL/kg/hr) 390 (0.2) 12 (0)   Stool 1200 (0.5) 150 (0.4)   Total Output 1590 162   Net +481.6 +14        Stool Occurrence 2 x     PHYSICAL EXAMINATION: General: Elderly, chronically  ill appearing female Neuro:  confusion, not following commands this am ,grossly non focal HEENT: NCAT Cardiovascular: RRR Lungs: bilateral breath sounds present,decreased,  wheezing + Abdomen: soft, tender, distended, copious abdominal wall edema Musculoskeletal: bilateral feet have black toes Skin: black toes bilaterally  LABS:  Recent Labs Lab 12/15/12 0400 12/16/12 0420  12/16/12 1750 12/16/12 1800 12/16/12 1816 12/16/12 1932  12/17/12 0655  12/18/12 0450 12/18/12 0500 12/19/12 0445 12/20/12 0400 12/21/12 0620  HGB 8.2* 8.1*  < >  --   --   --   --   < >  --   --   --  8.5* 7.9* 7.8* 8.2*  WBC 7.4 7.9  < >  --   --   --   --   < >  --   --   --  21.8* 18.1* 18.3* 22.1*  PLT 316 381  < >  --   --   --   --   < >  --   --   --  411* 389 347 299  NA 141 139  --   --   --   --   --   < >  --   < >  --  134* 133* 134* 134*  K 3.5 3.6  --   --   --   --   --   < >  --   < >  --  3.8 3.8 3.7 3.7  CL 110 110  --   --   --   --   --   < >  --   < >  --  105 104 103 102  CO2 21 19  --   --   --   --   --   < >  --   < >  --  15* 15* 15* 14*  GLUCOSE 74 72  --   --   --   --   --   < >  --   < >  --  106* 116* 117* 171*  BUN 32* 33*  --   --   --   --   --   < >  --   < >  --  43* 51* 57* 64*  CREATININE 1.35* 1.60*  --   --   --   --   --   <  >  --   < >  --  2.33* 2.76* 2.93* 3.20*  CALCIUM 8.5 8.6  --   --   --   --   --   < >  --   < >  --  8.4 8.5 8.3* 8.3*  MG  --   --   --   --   --   --   --   < >  --   --   --   --  2.2 2.3 2.4  PHOS  --   --   --   --   --   --   --   < >  --   --   --   --  6.6* 7.2* 8.2*  AST 18 19  --   --   --   --   --   --   --   --   --   --   --   --   --   ALT 9 9  --   --   --   --   --   --   --   --   --   --   --   --   --   ALKPHOS 79 83  --   --   --   --   --   --   --   --   --   --   --   --   --   BILITOT 0.6 0.5  --   --   --   --   --   --   --   --   --   --   --   --   --   PROT 4.3* 4.4*  --   --   --   --   --   --   --   --   --   --   --   --   --  ALBUMIN 1.1* 1.2*  --   --   --   --   --   --   --   --   --   --   --   --   --   LATICACIDVEN  --   --   < > 1.1  --   --   --   --  1.0  --  1.0  --   --   --   --   TROPONINI  --   --   --   --   --   --  <0.30  --   --   --   --   --   --   --   --   PROCALCITON  --   --   --   --  1.02  --   --   --   --   --   --  1.04  --  0.94  --   PHART  --   --   --   --   --  7.348*  --   --   --   --   --   --   --   --   --   PCO2ART  --   --   --   --   --  29.2*  --   --   --   --   --   --   --   --   --   PO2ART  --   --   --   --   --  83.4  --   --   --   --   --   --   --   --   --   < > = values in this interval not displayed.  Recent Labs Lab 12/20/12 1552 12/20/12 1929 12/21/12 12/21/12 0413 12/21/12 0839  GLUCAP 135* 178* 158* 153* 167*   CXR: improvement in aeration  ASSESSMENT / PLAN:  PULMONARY A: Acute respiratory failure 2nd to HAP - Hx of COPD/asthma. Wheezing - resolved P:   - Oxygen to keep SpO2 > 92% - Bronchial hygiene - albuterol /atrovent nebs - Hold singulair for now. - DNI status  CARDIOVASCULAR A: Septic shock 2nd to pneumonia >> resolved. Hx of HTN. Atrial fibrillation B/l digital ischemia of feet and Rt hand >>  Related to pressors, no PAD , no embolic source doubt PE since was on  heparin. P:  - Continue heparin gtt per pharmacy - CVP q shift - taper Stress dose steroids. - off neo, goal sbp 80, max dose 100 - Limited code - no cpr or cardioversion - will need amputation of toes at some point  RENAL A: Acute kidney injury >> deteriorating with hypotension. Likely ATN. Hypokalemia >> improved. Non gap metabolic acidosis >> resolved. Oliguric Mild hyponatremia -improving P:   - Monitor CVP. - Avoid nephrotoxic agents - Trend BMET  GASTROINTESTINAL A: Constipation >> improved 7/17 with SMOG enema. Dysphagia. Abdominal wall hernia Hx of GERD. P:   - SUP, protonix. -ct tube feeds via post pyloric panda  HEMATOLOGIC A: Anemia of chronic illness/iron deficiency -  PRBC transfusion on 7/08, 7/09, 7/14. 7/17. Thrombocytopenia - resolved Ischemic toes P:  - F/U CBC - Transfuse for Hb < 7 -heparin per levels for a fibn  INFECTIOUS A: HAP >> resolved. Septic shock likely, source unclear, LLL pna was treated  P:   - continue zosyn x 5ds  total - OK to dc 7/28 now that pct down, d/c vanc - F/U on cultures.  ENDOCRINE A: Mild hyperglycemia >> no hx of DM. P:   - ISS. - CBGs as ordered.  NEUROLOGIC A: Acute encephalopathy - 2/2 to sepsis, hypoxia, and pain meds >> improved 7/18. Hx of CVA, depression, fibromyalgia, anxiety. Post-op pain control. Pain from lower extremity ischemia. P:   - Avoid benzo's. - prn fentanyl -haldol prn agitation only  ORTHOPEDIC A: Arthritis s/p Rt hip arthroplasty. P: - Post-op care per ortho, 50% wt bearing Ok -  PT/OT   GLOBAL - Limited interventions. No HD or surgery, neo cap at 100. Unfortunately renal failure is progressive but appears to be plateu-ing. I have kept  POA patricia informed - however other family members appear to be giving mixed messages. Updated Sandy re: weekend changes Clearly comfort important in parallel to full medical care Family declined palliative care consult   The patient is  critically ill with multiple organ systems failure and requires high complexity decision making for assessment and support, frequent evaluation and titration of therapies, application of advanced monitoring technologies and extensive interpretation of multiple databases. Critical Care Time devoted to patient care services described in this note is 35 minutes.    Velmer Woelfel V.

## 2012-12-21 NOTE — Progress Notes (Signed)
Appraised of status recently returning to town though have received daily updates through my PA and partners who have seen her at least daily.  Certainly not looking good this evening with worsening mental status and renal function.  Hip seems fine and is dry.  Appreciate CCM help.  Looked for family in waiting area but they have left for evening and I will try to reach them in morning again.

## 2012-12-22 LAB — GLUCOSE, CAPILLARY
Glucose-Capillary: 166 mg/dL — ABNORMAL HIGH (ref 70–99)
Glucose-Capillary: 198 mg/dL — ABNORMAL HIGH (ref 70–99)

## 2012-12-22 LAB — CBC
MCH: 29.4 pg (ref 26.0–34.0)
Platelets: 260 10*3/uL (ref 150–400)
RBC: 2.48 MIL/uL — ABNORMAL LOW (ref 3.87–5.11)
WBC: 26.7 10*3/uL — ABNORMAL HIGH (ref 4.0–10.5)

## 2012-12-22 LAB — BASIC METABOLIC PANEL
CO2: 21 mEq/L (ref 19–32)
Calcium: 8.3 mg/dL — ABNORMAL LOW (ref 8.4–10.5)
Chloride: 106 mEq/L (ref 96–112)
GFR calc Af Amer: 13 mL/min — ABNORMAL LOW (ref >90)
Sodium: 138 mEq/L (ref 135–145)

## 2012-12-22 MED ORDER — SODIUM BICARBONATE 8.4 % IV SOLN
50.0000 meq | Freq: Once | INTRAVENOUS | Status: AC
  Administered 2012-12-22: 50 meq via INTRAVENOUS
  Filled 2012-12-22: qty 50

## 2012-12-22 MED ORDER — PHENYLEPHRINE HCL 10 MG/ML IJ SOLN
30.0000 ug/min | INTRAVENOUS | Status: DC
  Administered 2012-12-22: 30 ug/min via INTRAVENOUS
  Filled 2012-12-22 (×2): qty 2

## 2012-12-22 MED ORDER — SODIUM CHLORIDE 0.9 % IV BOLUS (SEPSIS)
500.0000 mL | Freq: Once | INTRAVENOUS | Status: AC
  Administered 2012-12-22: 500 mL via INTRAVENOUS

## 2012-12-23 LAB — CULTURE, BLOOD (ROUTINE X 2): Culture: NO GROWTH

## 2012-12-26 MED FILL — Dextrose Inj 50%: INTRAVENOUS | Qty: 50 | Status: AC

## 2012-12-26 NOTE — Progress Notes (Signed)
eLink Physician-Brief Progress Note Patient Name: Natalie Kent DOB: November 12, 1930 MRN: [id number]  Date of Service  12/09/2012   HPI/Events of Note  Shock in setting renal failure, sepsis  eICU Interventions  Bicarb x 1 NS bolus 500cc x 1   Intervention Category Major Interventions: Shock - evaluation and management  Donavyn Fecher S. 12/17/2012, 12:36 AM

## 2012-12-26 NOTE — Progress Notes (Signed)
eLink Physician-Brief Progress Note Patient Name: Natalie Kent DOB: 1931-04-27 MRN: [id number]  Date of Service  12/24/2012   HPI/Events of Note   Persistent shock, obtundation. Pt remains limited code, pressors acceptable.   eICU Interventions  Will restart phenylephrine via peripheral until family's wishes can be clarified at [facility] 7/28   Intervention Category Major Interventions: Shock - evaluation and management  Eyleen Rawlinson S. 12/18/2012, 1:49 AM

## 2012-12-26 NOTE — Progress Notes (Signed)
Pt with decline in SBP, more irregular heart rate, and and more agonal with her respirations, Natalie Kent was called and updated on pt's status. Attempted to call [facility] for her with no success in reaching her. Natalie Kent aware and to call other family members. Dr Delton Coombes made aware of situation.

## 2012-12-26 NOTE — Progress Notes (Signed)
Asystole occurred at 6:07. Patient seen and examined at bedside at 0630. Patient without pulse or respirations.  Patient pronounced deceased.

## 2012-12-26 NOTE — Progress Notes (Signed)
Pt dropping SBP with increasing Neo gtt, at 0603 pt dropping HR into the 30's and 40's . Dr Delton Coombes called and notified of changes in patients condition No drugs to be given at this time per Dr Delton Coombes , Daughter Texie at bedside and understands the gravity of the situation. Pt went asystole at 0607 after other family members arrived. Support offered, Pastoral care service asked to come visit with the family. Awaiting other family Dustin Flock RN

## 2012-12-26 NOTE — Progress Notes (Signed)
Was called to pt room after passing. Daughter, son, and their spouses were in lobby when I arrived. They were appropriately tearful. Provided grief support and offered drinks. Family said they were ok; grateful for the support. Marjory Lies Chaplain  12/15/2012 0600  Clinical Encounter Type  Visited With Family

## 2012-12-26 DEATH — deceased

## 2013-01-03 NOTE — Discharge Summary (Signed)
PULMONARY  / CRITICAL CARE MEDICINE  Name: CAMBREY LUPI MRN: [id number] DOB: 1931-05-04    ADMISSION DATE:  11/28/2012 CONSULTATION DATE:  12/04/2012  REFERRING MD : Marcene Corning  BRIEF PATIENT DESCRIPTION:  79 yo female with arthritis and Rt hip pain.  Had Rt total hip arthroplasty on 12/07/2012.  Developed altered mental status, abdominal discomfort and hypotension POD#1.  PCCM consulted 7/10 , treated as sepsis fro LLL HCAP,required mechanical ventilation until 7/15, course complicated by gangrene of toes due to pressors. PCCM reconsulted 7/22 for hypotension, AKI, presumed ileus vs abdominal sepsis  SIGNIFICANT EVENTS: 7/08 Rt total hip arthroplasty 7/09 Change mental status, acute renal failure 7/10 VDRF 7/15 Extubated 7/16 Still no bowel movement, wheezing on exam 7/17 VVS consult for lower extremity digital ischemia 7/22 transferred to ICU for hypotension, AKI, ileus vs abdominal sepsis 7/25 off pressors  STUDIES:  7/09 CT head >> no acute findings.  Prior Lt MCA infarct and  left MCA M1 segment stent. 7/09 Abd u/s >> no acute findings 7/09 CT abd/pelvis >> stool filled colon, anterior abdominal wall hernia 7/10 Echo >> mild LVH, EF 65 to 70%, grade 1 diastolic dysfx, mild LA dilation 7/15 ABD xray >> relatively large stool burden 7/17 ABI's 7/23 stool filled trasnverse colon, hip ok 7/24 Poor urine output despite lasix 80mg  IV   LINES / TUBES: 7/10 Lt IJ CVL >>  7/10 ETT >>7/15  7/10 Rt radial aline >> out  CULTURES: 7/09 Urine >> negative 7/09 Blood >> no growth 7/09 Sputum >> Yeast 7/22 blood>>>ngtd 7/22 urine>>> no growth final 7/22 c diff neg  ANTIBIOTICS: Vancomycin 7/08 >> 7/18 Imipenem 7/09 >> 7/18 Vancomycin 7/22>>>7/25 Zosyn 7/22>>>   COURSE :  PULMONARY A: Acute respiratory failure 2nd to HAP - Hx of COPD/asthma. Wheezing - resolved   CARDIOVASCULAR A: Septic shock 2nd to pneumonia >> resolved. Hx of HTN. Atrial fibrillation B/l digital  ischemia of feet and Rt hand >>  Related to pressors, no PAD , no embolic source doubt PE since was on heparin. P:  - Continue heparin gtt per pharmacy - tapered Stress dose steroids. - off neo gtt - Limited code - no cpr or cardioversion  RENAL A: Acute kidney injury >> deteriorating with hypotension. Likely ATN. Hypokalemia >> improved. Non gap metabolic acidosis >> resolved. Oliguric Mild hyponatremia -improving   GASTROINTESTINAL A: Constipation >> improved 7/17 with SMOG enema. Dysphagia. Abdominal wall hernia Hx of GERD. P:   - tube feeds via post pyloric panda  HEMATOLOGIC A: Anemia of chronic illness/iron deficiency -  PRBC transfusion on 7/08, 7/09, 7/14. 7/17. Thrombocytopenia - resolved Ischemic toes   INFECTIOUS A: HAP >> resolved. Septic shock likely, source unclear, LLL pna was treated    ENDOCRINE A: Mild hyperglycemia >> no hx of DM.  NEUROLOGIC A: Acute encephalopathy - 2/2 to sepsis, hypoxia, and pain meds >> improved 7/18. Hx of CVA, depression, fibromyalgia, anxiety. Post-op pain control. Pain from lower extremity ischemia.   ORTHOPEDIC A: Arthritis s/p Rt hip arthroplasty. P: - Post-op care per ortho, 50% wt bearing Ok -  PT/OT   GLOBAL - Limited interventions. No HD or surgery, neo capped at 100. Unfortunately renal failure was progressive  Family declined palliative care consult. Cnosulted nephrology & no dilaysis advised. She progressively worsened & passed away on 7/ 28/14  Cause of death  - Septic shock, acute renal failure, pneumonia   Srihan Brutus V.

## 2013-02-11 ENCOUNTER — Ambulatory Visit: Payer: Medicare Other | Admitting: Podiatry

## 2015-01-16 IMAGING — CR DG CHEST 2V
2 series · 2 of 2 positions shown · non-contrast
Comparison: 09/14/2012

CLINICAL DATA: Preoperative study for right hip surgery, history of
hypertension

CHEST - 2 VIEW

[w chest pa]
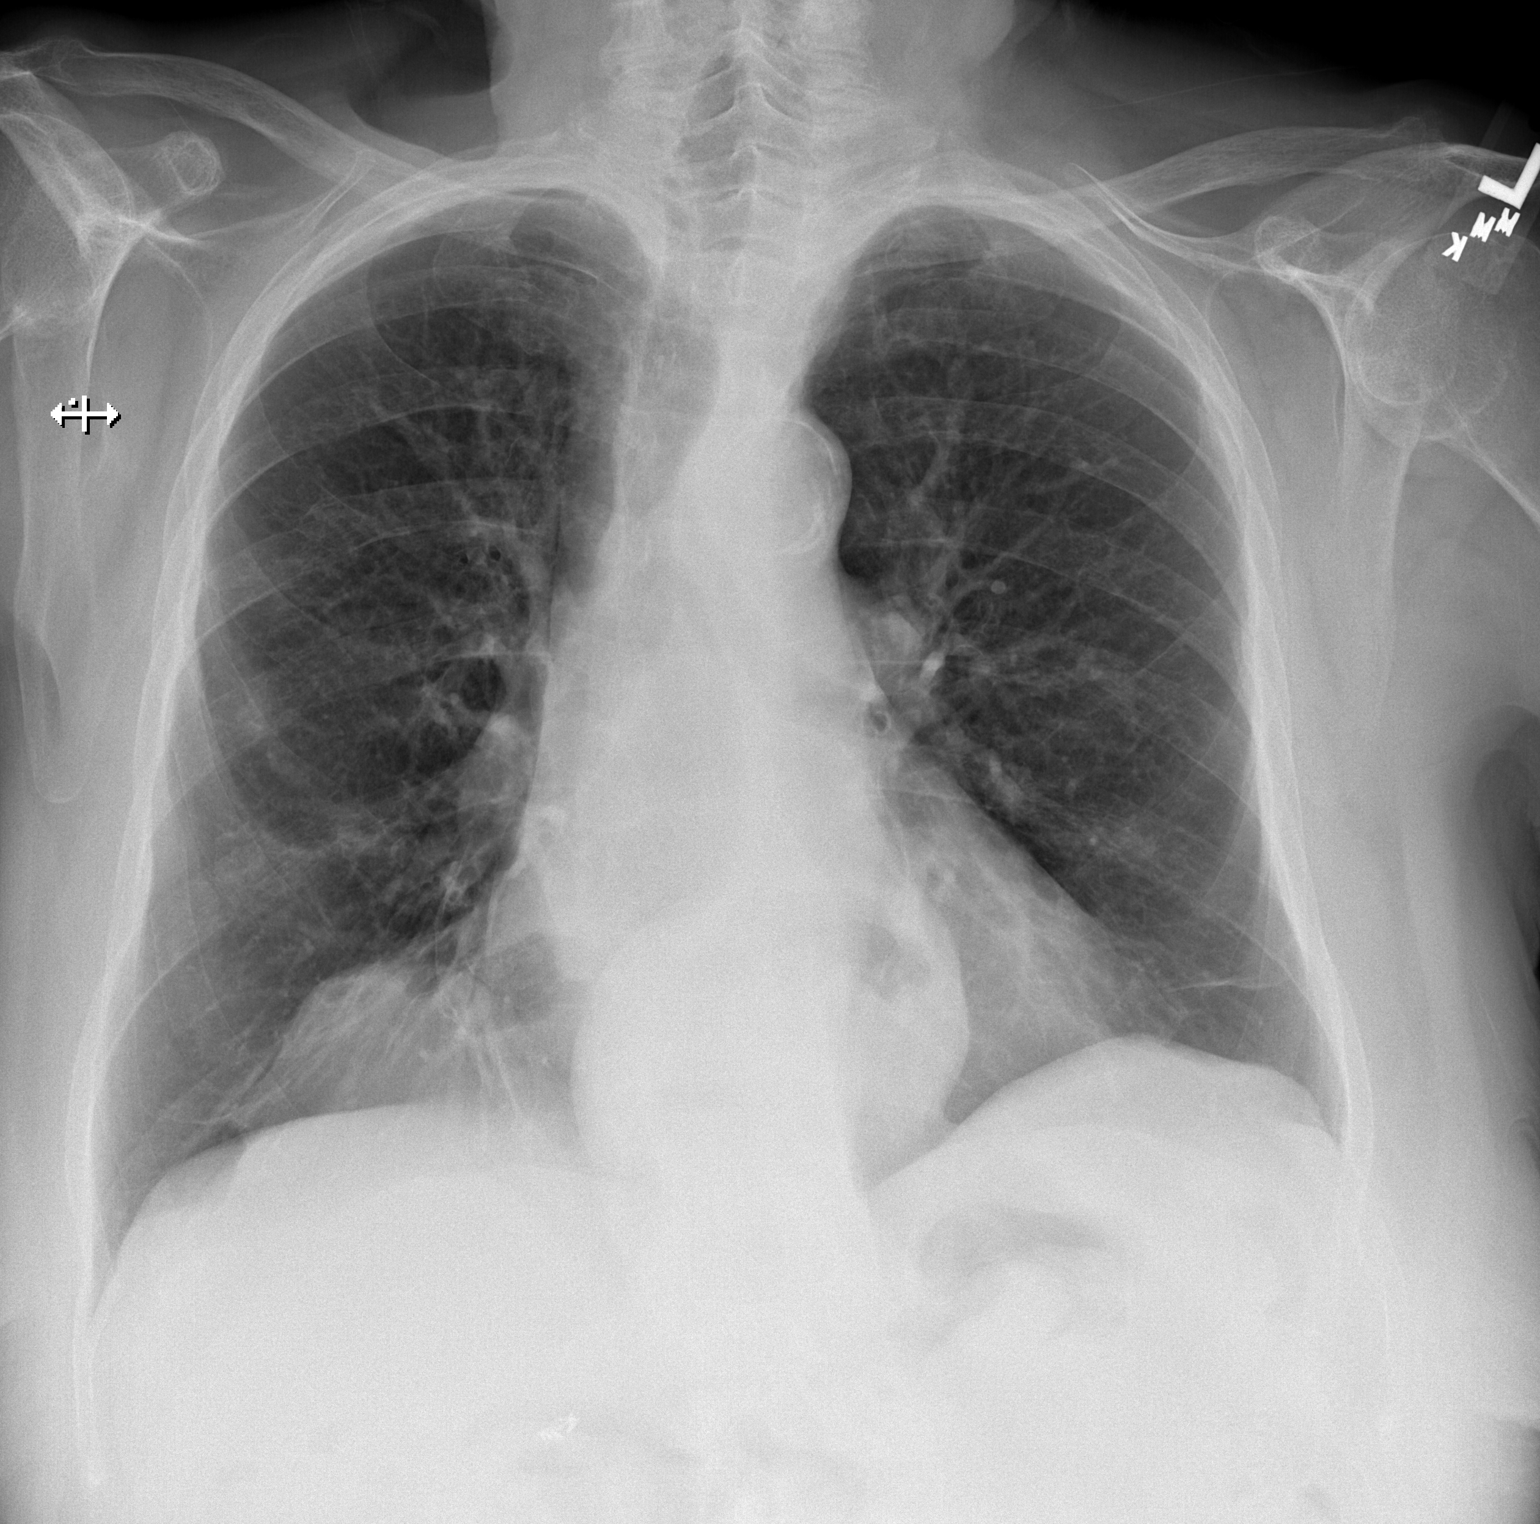

[w chest lat]
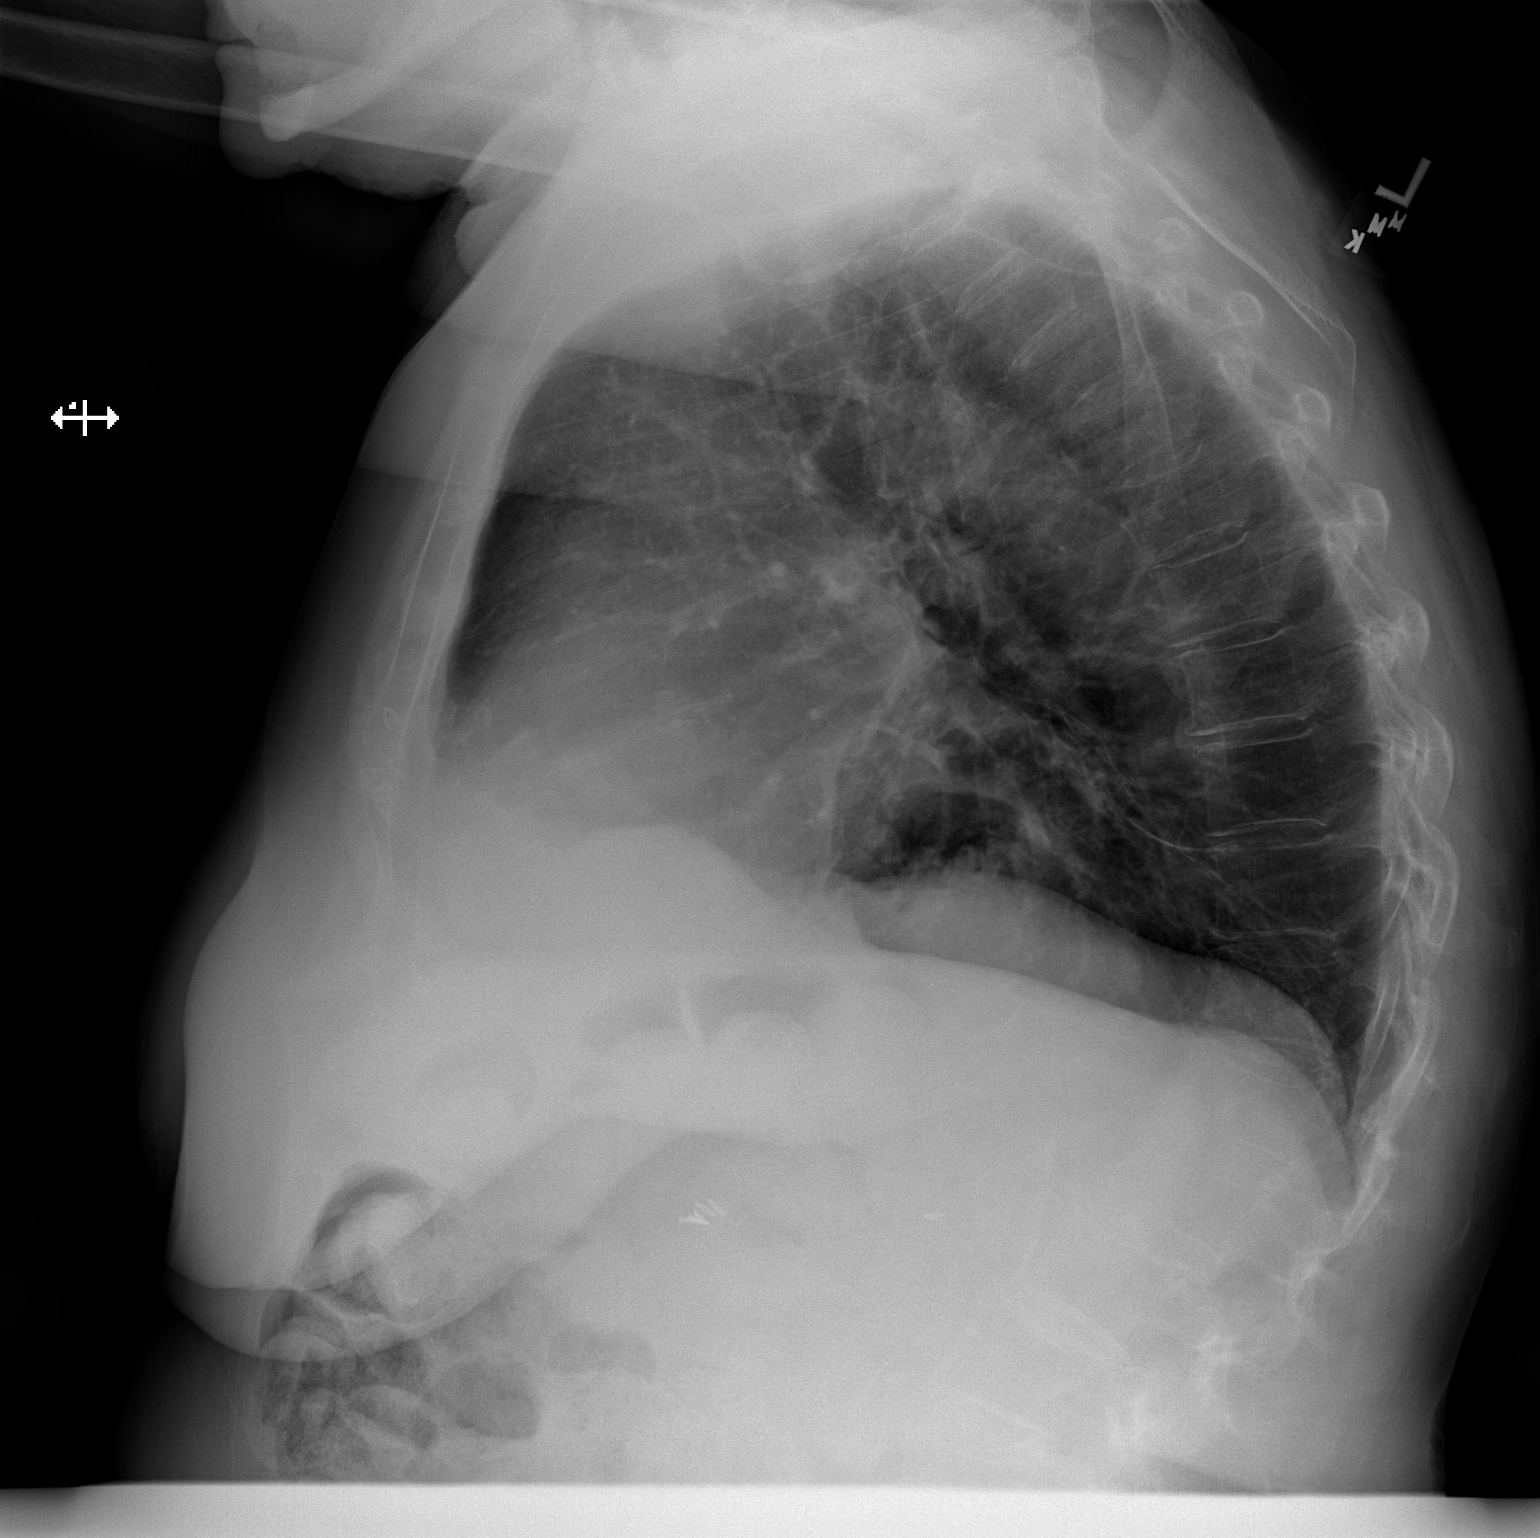

[2 of 2 positions shown; findings below may reference images not displayed]

FINDINGS: The heart size and vascular pattern are normal.  There is
a moderate hiatal hernia.  There is eventration of the diaphragm on
the right.  These findings are stable.  There is mild bibasilar
scarring.
IMPRESSION: No acute findings

## 2015-01-23 IMAGING — RF DG HIP OPERATIVE*R*
1 series · 2 of 2 positions shown · non-contrast
Comparison: None.

CLINICAL DATA: Post anterior approach right total hip replacement

DG OPERATIVE RIGHT HIP

[Series 1: run · 2 of 2 slices shown]
[im 1/2]
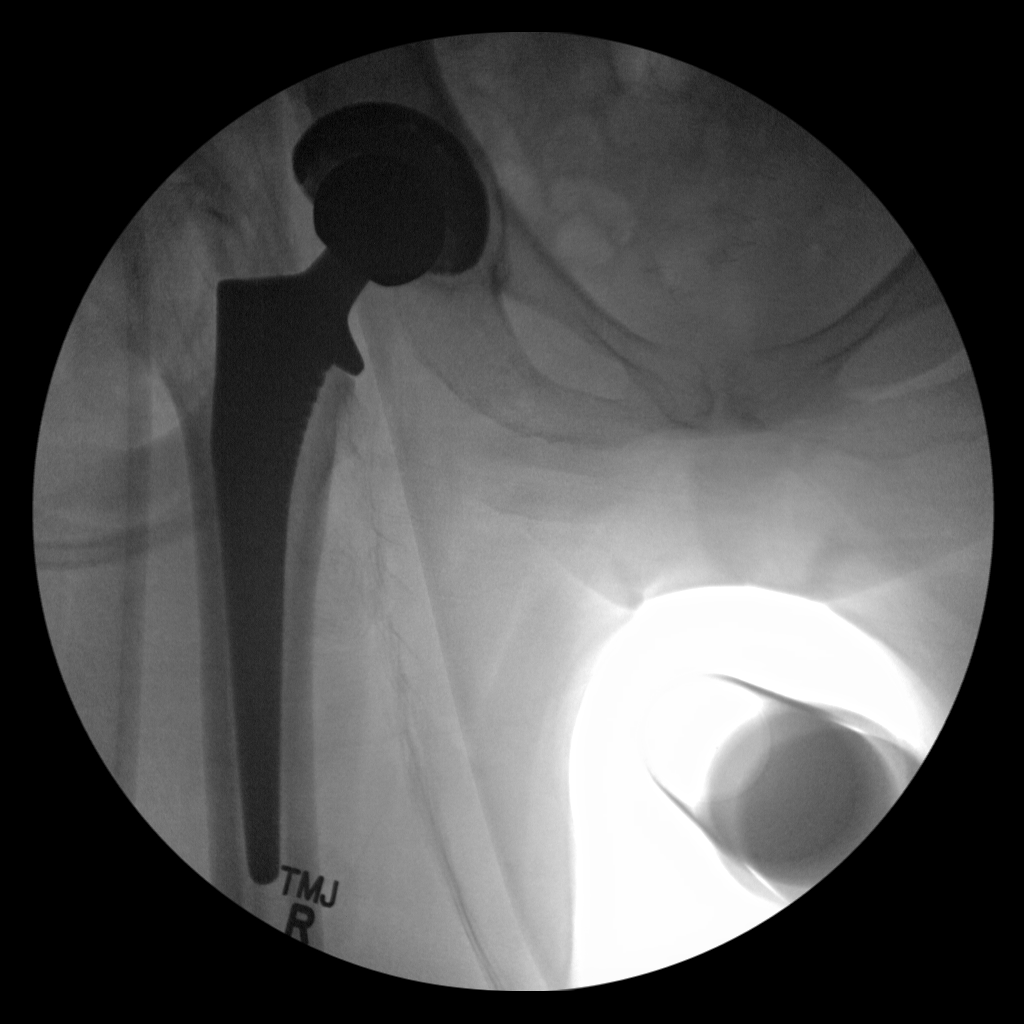
[im 2/2]
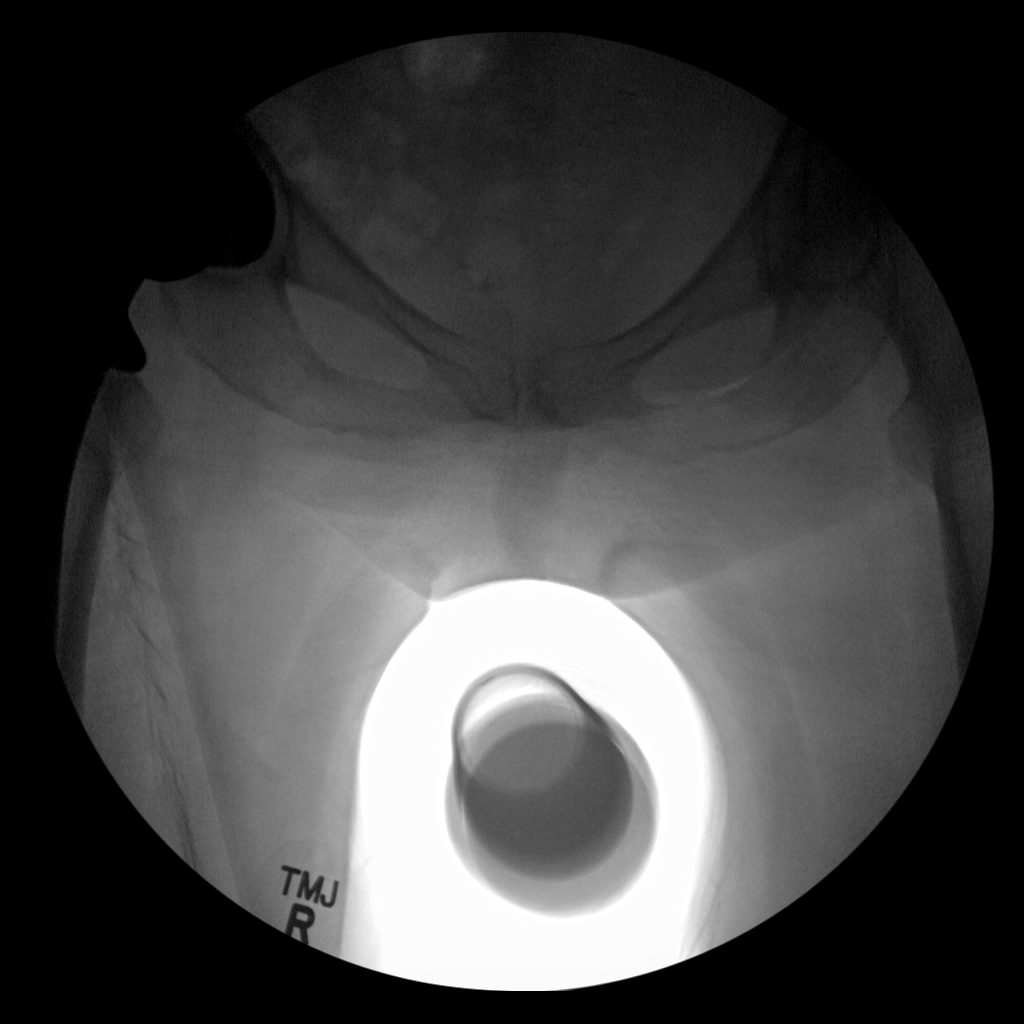

[2 of 2 positions shown; findings below may reference images not displayed]

FINDINGS: Two anterior projection images of the lower pelvis and right hip
are provided for review.

Images demonstrate the sequela of right sided total hip
replacement.  Alignment appears near anatomic on the provided
anterior projection.  No definite fracture.  No definite radiopaque
foreign body.
IMPRESSION: Post right total hip replacement without definite evidence of
complication.

## 2015-01-24 IMAGING — CT CT HEAD W/O CM
1 series · 16 of 30 positions shown, 20 images · non-contrast
Comparison: 06/30/2012 and earlier.

CLINICAL DATA: 81-year-old female with acute encephalopathy.

CT HEAD WITHOUT CONTRAST
TECHNIQUE: Contiguous axial images were obtained from the base of
the skull through the vertex without contrast.

[Series 2: head 5.0 h30s · axial · 0.45mm/px · z∈[-117,+18]mm · 16 of 31 slices shown, 20 images]
[im 2/31  brain]
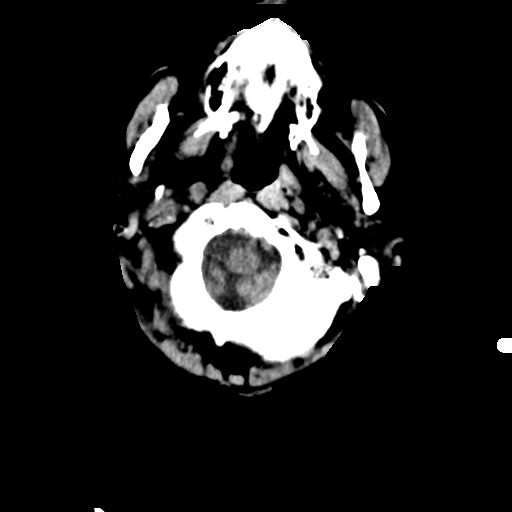
[im 2/31  bone]
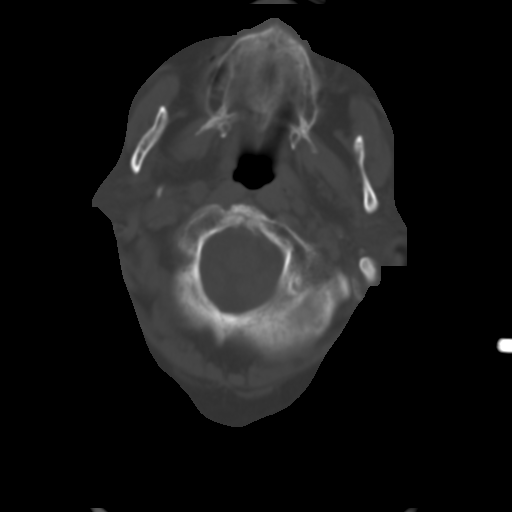
[im 4/31  brain]
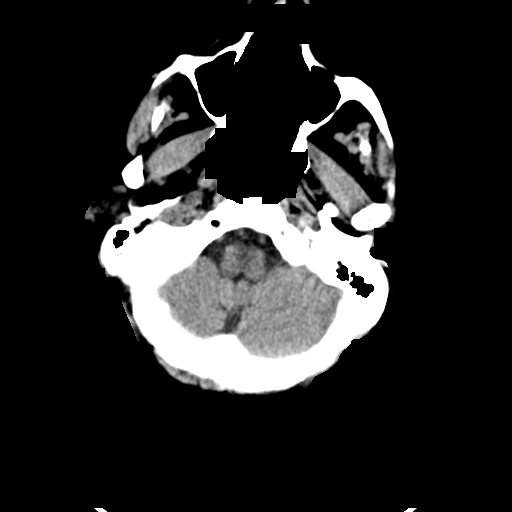
[im 6/31  brain]
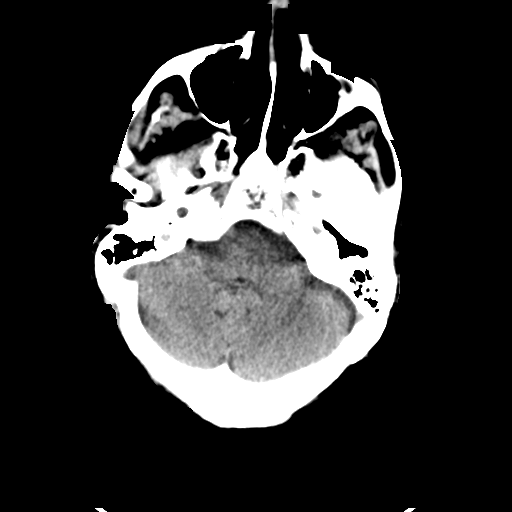
[im 8/31  brain]
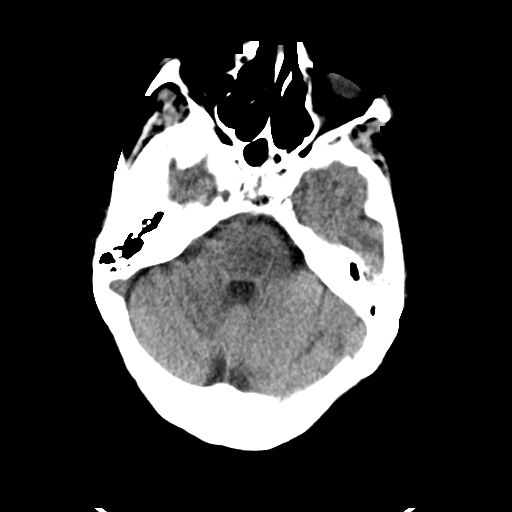
[im 9/31  brain]
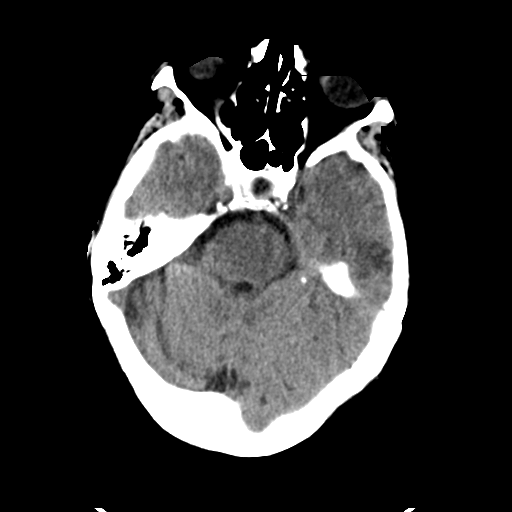
[im 9/31  bone]
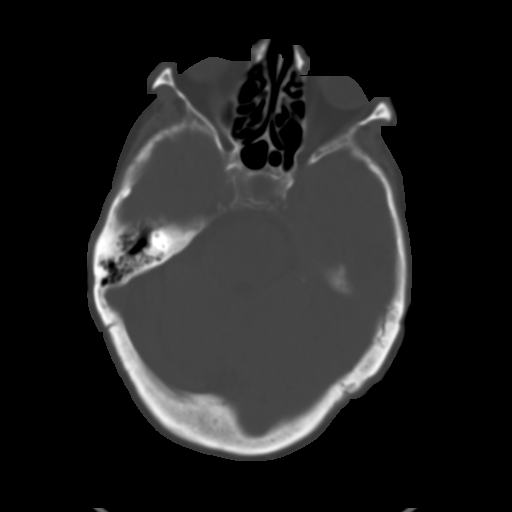
[im 11/31  brain]
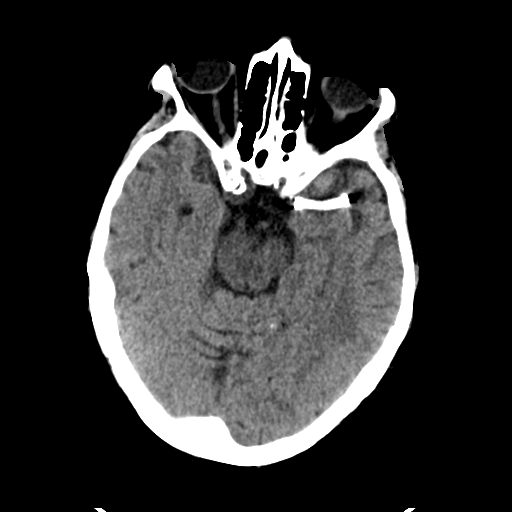
[im 13/31  brain]
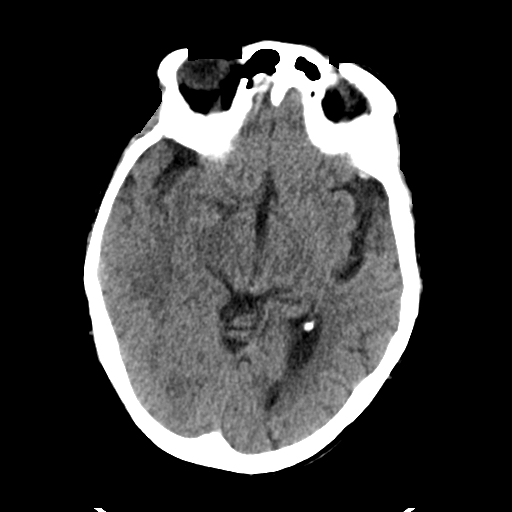
[im 15/31  brain]
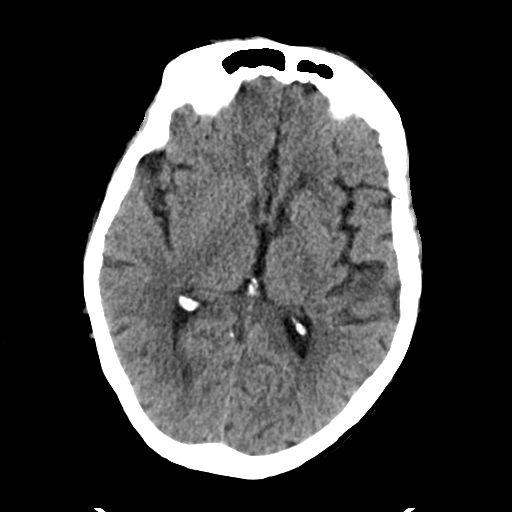
[im 16/31  brain]
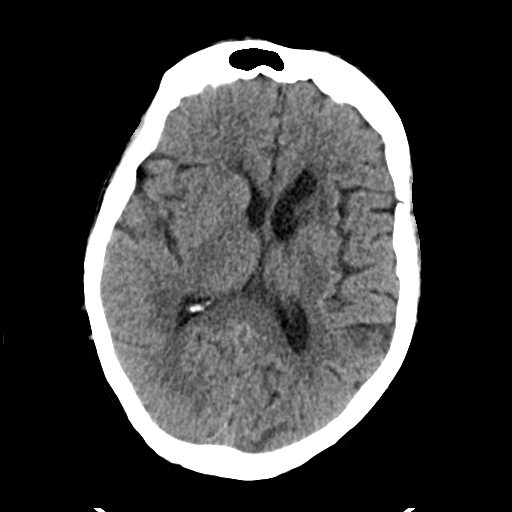
[im 16/31  bone]
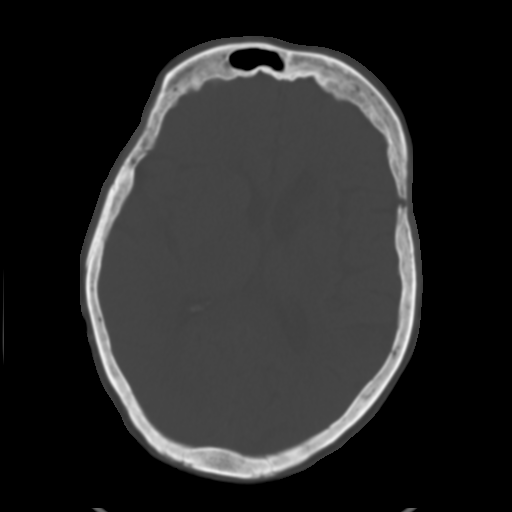
[im 18/31  brain]
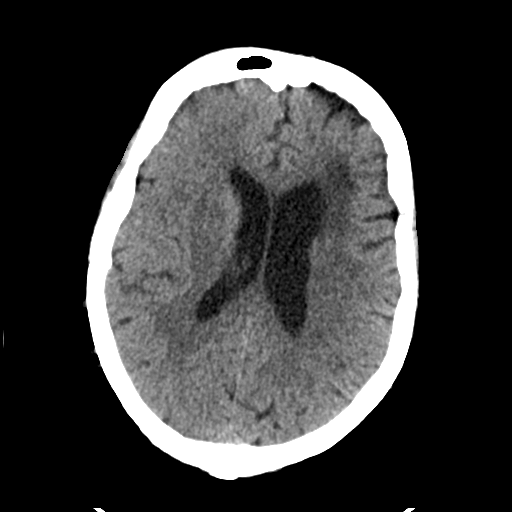
[im 20/31  brain]
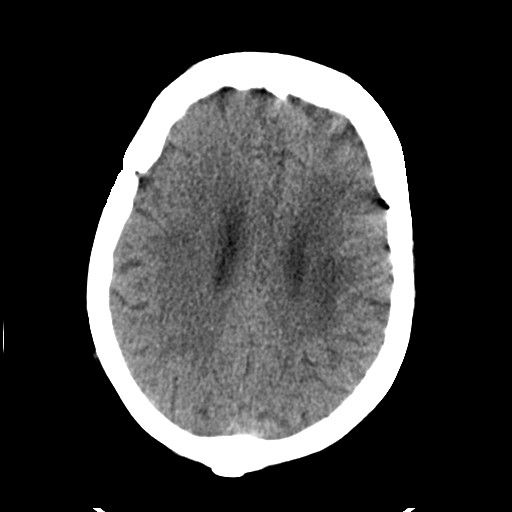
[im 22/31  brain]
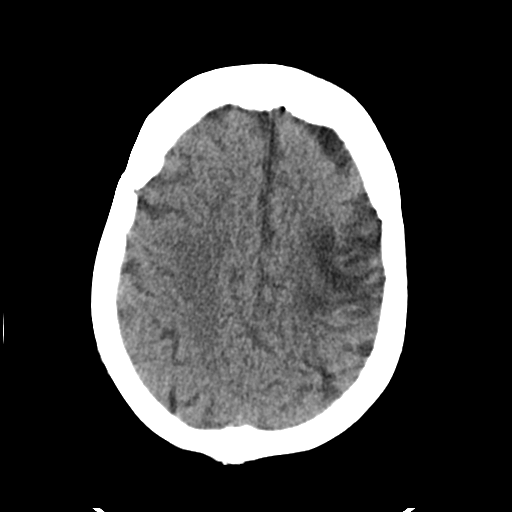
[im 23/31  brain]
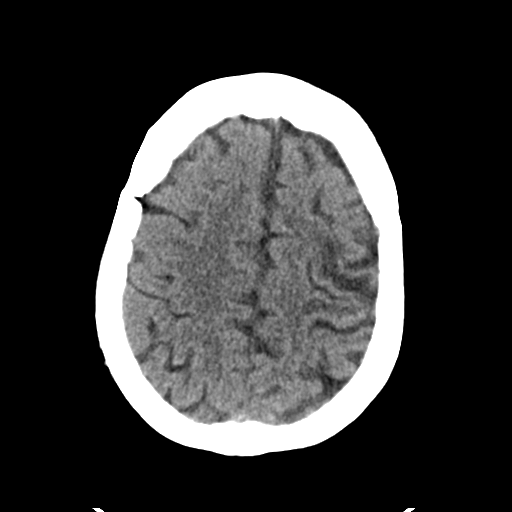
[im 23/31  bone]
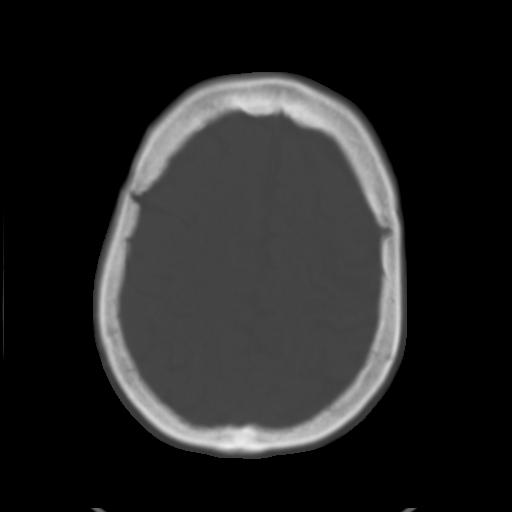
[im 25/31  brain]
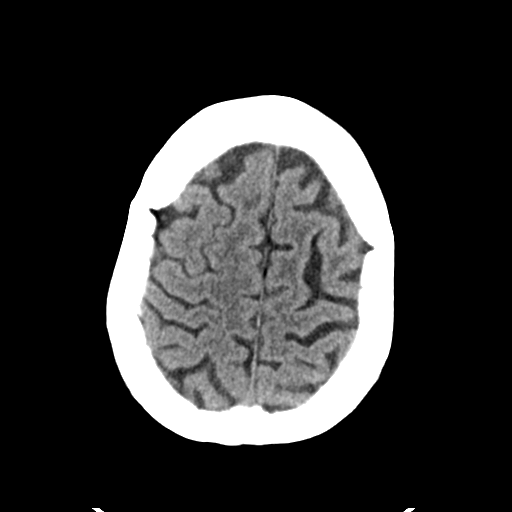
[im 27/31  brain]
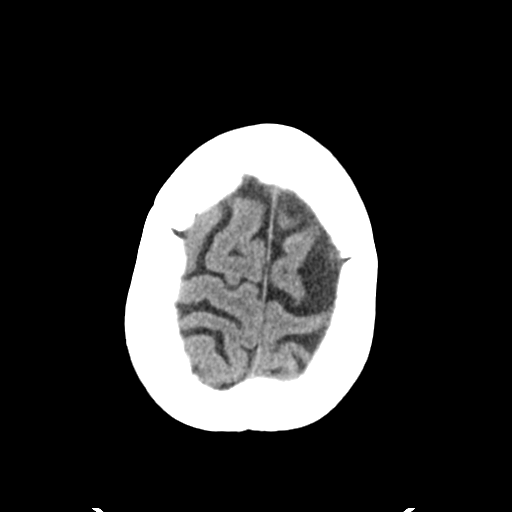
[im 29/31  brain]
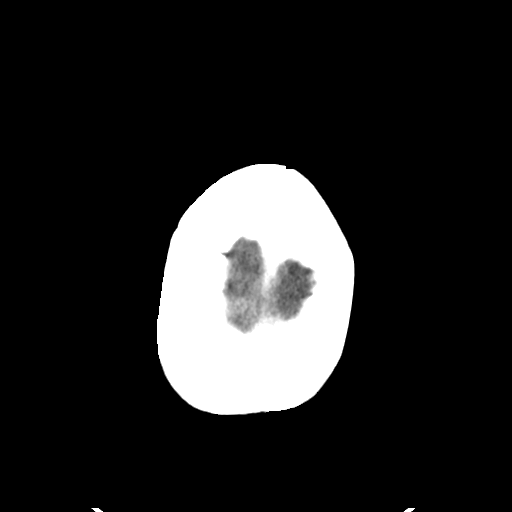

[16 of 30 positions shown; findings below may reference images not displayed]

FINDINGS: Visualized paranasal sinuses and mastoids are clear.  No
acute osseous abnormality identified.  Intermittent mild motion
artifact.  No acute orbit or scalp soft tissue findings.

Calcified atherosclerosis at the skull base.  There is a left MCA
M1 segment stent re-identified.  Stable cerebral volume.  Chronic
left MCA encephalomalacia.  Stable ventricles with ex vacuo change
to the left lateral ventricle. No midline shift, mass effect, or
evidence of mass lesion.  No evidence of cortically based acute
infarction identified.  No acute intracranial hemorrhage
identified.  No suspicious intracranial vascular hyperdensity.
IMPRESSION: No acute intracranial abnormality.
Stable noncontrast CT appearance of the brain with chronic left MCA
infarct and left MCA M1 segment stent.
